# Patient Record
Sex: Female | Born: 1940 | Race: White | Hispanic: No | Marital: Married | Smoking: Current every day smoker
Health system: Southern US, Community
[De-identification: ages and names within clinical notes are randomized; demographics above are authoritative.]

## PROBLEM LIST (undated history)

## (undated) DIAGNOSIS — M549 Dorsalgia, unspecified: Secondary | ICD-10-CM

## (undated) DIAGNOSIS — N289 Disorder of kidney and ureter, unspecified: Secondary | ICD-10-CM

## (undated) DIAGNOSIS — K219 Gastro-esophageal reflux disease without esophagitis: Secondary | ICD-10-CM

## (undated) DIAGNOSIS — E7800 Pure hypercholesterolemia, unspecified: Secondary | ICD-10-CM

## (undated) DIAGNOSIS — R011 Cardiac murmur, unspecified: Secondary | ICD-10-CM

## (undated) DIAGNOSIS — J449 Chronic obstructive pulmonary disease, unspecified: Secondary | ICD-10-CM

## (undated) DIAGNOSIS — R609 Edema, unspecified: Secondary | ICD-10-CM

## (undated) DIAGNOSIS — IMO0001 Reserved for inherently not codable concepts without codable children: Secondary | ICD-10-CM

## (undated) DIAGNOSIS — G4730 Sleep apnea, unspecified: Secondary | ICD-10-CM

## (undated) DIAGNOSIS — I10 Essential (primary) hypertension: Secondary | ICD-10-CM

## (undated) DIAGNOSIS — E119 Type 2 diabetes mellitus without complications: Secondary | ICD-10-CM

## (undated) DIAGNOSIS — M1990 Unspecified osteoarthritis, unspecified site: Secondary | ICD-10-CM

## (undated) DIAGNOSIS — J189 Pneumonia, unspecified organism: Secondary | ICD-10-CM

## (undated) HISTORY — PX: REPLACEMENT TOTAL KNEE: SUR1224

## (undated) HISTORY — PX: BACK SURGERY: SHX140

## (undated) HISTORY — PX: CATARACT EXTRACTION: SUR2

---

## 1986-03-02 HISTORY — PX: ABDOMINAL HYSTERECTOMY: SHX81

## 1997-12-01 ENCOUNTER — Ambulatory Visit (HOSPITAL_COMMUNITY): Admission: RE | Admit: 1997-12-01 | Discharge: 1997-12-01 | Payer: Self-pay | Admitting: *Deleted

## 2004-01-03 ENCOUNTER — Ambulatory Visit: Payer: Self-pay | Admitting: Internal Medicine

## 2004-01-10 ENCOUNTER — Ambulatory Visit: Payer: Self-pay | Admitting: Gastroenterology

## 2005-01-12 ENCOUNTER — Ambulatory Visit: Payer: Self-pay

## 2005-01-29 ENCOUNTER — Ambulatory Visit: Payer: Self-pay | Admitting: Internal Medicine

## 2005-10-13 ENCOUNTER — Ambulatory Visit: Payer: Self-pay | Admitting: Gastroenterology

## 2006-01-28 ENCOUNTER — Encounter: Admission: RE | Admit: 2006-01-28 | Discharge: 2006-01-28 | Payer: Self-pay | Admitting: Neurosurgery

## 2006-02-11 ENCOUNTER — Ambulatory Visit (HOSPITAL_COMMUNITY): Admission: RE | Admit: 2006-02-11 | Discharge: 2006-02-12 | Payer: Self-pay | Admitting: Neurosurgery

## 2006-03-08 ENCOUNTER — Ambulatory Visit: Payer: Self-pay | Admitting: Internal Medicine

## 2006-03-22 ENCOUNTER — Ambulatory Visit: Payer: Self-pay | Admitting: Gastroenterology

## 2006-03-24 ENCOUNTER — Ambulatory Visit: Payer: Self-pay | Admitting: Gastroenterology

## 2006-11-20 ENCOUNTER — Encounter: Admission: RE | Admit: 2006-11-20 | Discharge: 2006-11-20 | Payer: Self-pay | Admitting: Neurosurgery

## 2007-03-03 HISTORY — PX: JOINT REPLACEMENT: SHX530

## 2007-04-14 ENCOUNTER — Ambulatory Visit: Payer: Self-pay | Admitting: Internal Medicine

## 2007-12-01 ENCOUNTER — Ambulatory Visit: Payer: Self-pay | Admitting: Specialist

## 2008-01-09 ENCOUNTER — Ambulatory Visit: Payer: Self-pay | Admitting: Unknown Physician Specialty

## 2008-01-17 ENCOUNTER — Inpatient Hospital Stay: Payer: Self-pay | Admitting: Unknown Physician Specialty

## 2008-01-25 ENCOUNTER — Encounter: Payer: Self-pay | Admitting: Internal Medicine

## 2008-02-03 ENCOUNTER — Inpatient Hospital Stay: Payer: Self-pay | Admitting: Internal Medicine

## 2008-02-07 ENCOUNTER — Encounter: Payer: Self-pay | Admitting: Internal Medicine

## 2008-03-02 ENCOUNTER — Encounter: Payer: Self-pay | Admitting: Internal Medicine

## 2008-04-02 ENCOUNTER — Encounter: Payer: Self-pay | Admitting: Internal Medicine

## 2008-04-05 ENCOUNTER — Ambulatory Visit: Payer: Self-pay | Admitting: Internal Medicine

## 2008-04-19 ENCOUNTER — Ambulatory Visit: Payer: Self-pay | Admitting: Internal Medicine

## 2008-04-30 ENCOUNTER — Encounter: Payer: Self-pay | Admitting: Internal Medicine

## 2008-05-31 ENCOUNTER — Encounter: Payer: Self-pay | Admitting: Internal Medicine

## 2008-06-30 ENCOUNTER — Encounter: Payer: Self-pay | Admitting: Internal Medicine

## 2008-07-31 ENCOUNTER — Encounter: Payer: Self-pay | Admitting: Internal Medicine

## 2008-10-25 ENCOUNTER — Ambulatory Visit: Payer: Self-pay | Admitting: Physician Assistant

## 2008-11-06 ENCOUNTER — Ambulatory Visit: Payer: Self-pay | Admitting: Pain Medicine

## 2008-11-20 ENCOUNTER — Ambulatory Visit: Payer: Self-pay | Admitting: Physician Assistant

## 2008-11-27 ENCOUNTER — Ambulatory Visit: Payer: Self-pay | Admitting: Pain Medicine

## 2008-12-18 ENCOUNTER — Ambulatory Visit: Payer: Self-pay | Admitting: Physician Assistant

## 2009-03-02 HISTORY — PX: CHOLECYSTECTOMY: SHX55

## 2009-04-25 ENCOUNTER — Ambulatory Visit: Payer: Self-pay | Admitting: Internal Medicine

## 2009-04-30 ENCOUNTER — Ambulatory Visit: Payer: Self-pay | Admitting: Gastroenterology

## 2009-05-29 ENCOUNTER — Ambulatory Visit: Payer: Self-pay | Admitting: Surgery

## 2009-06-05 ENCOUNTER — Ambulatory Visit: Payer: Self-pay | Admitting: Surgery

## 2009-07-04 ENCOUNTER — Ambulatory Visit: Payer: Self-pay | Admitting: Pain Medicine

## 2009-07-22 ENCOUNTER — Ambulatory Visit: Payer: Self-pay | Admitting: Pain Medicine

## 2010-01-29 ENCOUNTER — Ambulatory Visit: Payer: Self-pay | Admitting: Pain Medicine

## 2010-05-29 ENCOUNTER — Ambulatory Visit: Payer: Self-pay | Admitting: Internal Medicine

## 2010-07-08 ENCOUNTER — Ambulatory Visit: Payer: Self-pay | Admitting: Gastroenterology

## 2010-07-10 LAB — PATHOLOGY REPORT

## 2010-07-18 NOTE — Op Note (Signed)
NAME:  Brenda Glenn, Brenda Glenn                  ACCOUNT NO.:  [id number]   MEDICAL RECORD NO.:  [id number]          PATIENT TYPE:  OIB   LOCATION:  3040                         FACILITY:  [facility]   PHYSICIAN:  Coletta Memos, M.D.     DATE OF BIRTH:  1940/04/22   DATE OF PROCEDURE:  02/11/2006  DATE OF DISCHARGE:  02/12/2006                               OPERATIVE REPORT   PREOPERATIVE DIAGNOSIS:  Displaced disk, L2-3 on the right.   POSTOPERATIVE DIAGNOSIS:  Displaced disk, L2-3 on the right.   PROCEDURE:  Lumbar laminectomy L2-3 for diskectomy, right side.   COMPLICATIONS:  None.   ASSISTANT:  Hewitt Shorts, M.D.   INDICATIONS:  Brenda Glenn is a woman whom I have followed for some time,  who has been a patient of mine for some time and of the practice.  She  came in today because she was having weakness in her hip flexors, 4-/5,  on the right side.  She had a large soft tissue mass and disk herniation  at that level.  Her distal strength was fine and did not show any  problems.  I therefore recommended and she agreed to undergo a lumbar  laminectomy and diskectomy.   PREOPERATIVE DIAGNOSIS:  Displaced disk at L2-3.   POSTOPERATIVE DIAGNOSIS:  Displaced disk at L2-3, right.   PROCEDURE:  Right L2-L3 hemilaminectomy and diskectomy with  microdissection.   COMPLICATIONS:  None.   SURGEON:  Coletta Memos, M.D.   ASSISTANT:  Hewitt Shorts, M.D.   OPERATIVE NOTE:  Brenda Glenn was brought to the operating room, intubated,  and placed under general anesthetic without difficulty.  She was rolled  prone onto a Wilson frame and all pressure points were properly padded.  Her back was prepped and she was draped in a sterile fashion.  I  infiltrated 10 mL of 0.5% lidocaine with 1:200,000 strength of  epinephrine into the lumbar region.  I opened the skin with a #10 blade  and took this down to the thoracolumbar fascia sharply.  Using x-ray I  was able to identify the L2-3 disk space.  I then  proceeded with a  hemilaminectomy and diskectomy using a high-speed drill of L2.  When I  identified the ligamentum flavum, I then removed that in piecemeal  fashion to expose the thecal sac.  I brought the microscope into the  operative field and  with Dr. Earl Gala assistance, we then dissected and removed what were  large fragments of disk underlying the nerve root of L2.  I removed the  disk without significant difficulty.  I then irrigated the wound.  I  then closed the wound in layered fashion using Vicryl sutures.  Dermabond was used to close the wound in a sterile fashion.           ______________________________  Coletta Memos, M.D.     KC/MEDQ  D:  03/16/2006  T:  03/17/2006  Job:  [id number]

## 2010-12-16 ENCOUNTER — Ambulatory Visit: Payer: Self-pay | Admitting: Internal Medicine

## 2011-06-02 ENCOUNTER — Ambulatory Visit: Payer: Self-pay | Admitting: Internal Medicine

## 2012-03-20 ENCOUNTER — Ambulatory Visit: Payer: Self-pay | Admitting: Family Medicine

## 2012-06-07 ENCOUNTER — Ambulatory Visit: Payer: Self-pay | Admitting: Internal Medicine

## 2012-08-16 ENCOUNTER — Ambulatory Visit: Payer: Self-pay

## 2012-08-25 ENCOUNTER — Ambulatory Visit: Payer: Self-pay | Admitting: Gastroenterology

## 2012-08-26 LAB — PATHOLOGY REPORT

## 2013-02-20 ENCOUNTER — Emergency Department: Payer: Self-pay | Admitting: Emergency Medicine

## 2013-02-20 LAB — COMPREHENSIVE METABOLIC PANEL
Alkaline Phosphatase: 153 U/L — ABNORMAL HIGH
Anion Gap: 1 — ABNORMAL LOW (ref 7–16)
Bilirubin,Total: 0.6 mg/dL (ref 0.2–1.0)
Calcium, Total: 9.3 mg/dL (ref 8.5–10.1)
Co2: 34 mmol/L — ABNORMAL HIGH (ref 21–32)
EGFR (Non-African Amer.): 55 — ABNORMAL LOW
Glucose: 133 mg/dL — ABNORMAL HIGH (ref 65–99)
Osmolality: 277 (ref 275–301)
SGOT(AST): 56 U/L — ABNORMAL HIGH (ref 15–37)
SGPT (ALT): 57 U/L (ref 12–78)
Sodium: 136 mmol/L (ref 136–145)
Total Protein: 7.5 g/dL (ref 6.4–8.2)

## 2013-02-20 LAB — URINALYSIS, COMPLETE
Blood: NEGATIVE
Glucose,UR: NEGATIVE mg/dL (ref 0–75)
Hyaline Cast: 1
Ketone: NEGATIVE
Leukocyte Esterase: NEGATIVE
Ph: 6 (ref 4.5–8.0)
Protein: 30
RBC,UR: <1 /HPF (ref 0–5)
Squamous Epithelial: 2

## 2013-02-20 LAB — CBC
HCT: 49.8 % — ABNORMAL HIGH (ref 35.0–47.0)
HGB: 16.4 g/dL — ABNORMAL HIGH (ref 12.0–16.0)
MCHC: 32.9 g/dL (ref 32.0–36.0)
MCV: 95 fL (ref 80–100)
Platelet: 242 10*3/uL (ref 150–440)
RBC: 5.27 10*6/uL — ABNORMAL HIGH (ref 3.80–5.20)
RDW: 15.9 % — ABNORMAL HIGH (ref 11.5–14.5)
WBC: 8.4 10*3/uL (ref 3.6–11.0)

## 2013-02-20 LAB — LIPASE, BLOOD: Lipase: 60 U/L — ABNORMAL LOW (ref 73–393)

## 2013-03-02 DIAGNOSIS — J189 Pneumonia, unspecified organism: Secondary | ICD-10-CM

## 2013-03-02 HISTORY — DX: Pneumonia, unspecified organism: J18.9

## 2013-03-10 ENCOUNTER — Ambulatory Visit: Payer: Self-pay | Admitting: Specialist

## 2013-03-28 ENCOUNTER — Inpatient Hospital Stay: Payer: Self-pay | Admitting: Internal Medicine

## 2013-03-28 LAB — CBC
HCT: 42.8 % (ref 35.0–47.0)
HGB: 13.7 g/dL (ref 12.0–16.0)
MCH: 30.3 pg (ref 26.0–34.0)
MCHC: 32.1 g/dL (ref 32.0–36.0)
MCV: 94 fL (ref 80–100)
Platelet: 222 10*3/uL (ref 150–440)
RBC: 4.54 10*6/uL (ref 3.80–5.20)
RDW: 15.9 % — ABNORMAL HIGH (ref 11.5–14.5)
WBC: 11.1 10*3/uL — ABNORMAL HIGH (ref 3.6–11.0)

## 2013-03-28 LAB — BASIC METABOLIC PANEL
Anion Gap: 5 — ABNORMAL LOW (ref 7–16)
BUN: 24 mg/dL — AB (ref 7–18)
Calcium, Total: 9.2 mg/dL (ref 8.5–10.1)
Chloride: 102 mmol/L (ref 98–107)
Co2: 30 mmol/L (ref 21–32)
Creatinine: 1.21 mg/dL (ref 0.60–1.30)
EGFR (African American): 52 — ABNORMAL LOW
EGFR (Non-African Amer.): 45 — ABNORMAL LOW
GLUCOSE: 112 mg/dL — AB (ref 65–99)
Osmolality: 279 (ref 275–301)
Potassium: 4.1 mmol/L (ref 3.5–5.1)
Sodium: 137 mmol/L (ref 136–145)

## 2013-03-28 LAB — TROPONIN I
Troponin-I: <0.02 ng/mL
Troponin-I: <0.02 ng/mL

## 2013-03-29 LAB — CBC WITH DIFFERENTIAL/PLATELET
Basophil #: 0 10*3/uL (ref 0.0–0.1)
Basophil %: 0 %
EOS ABS: 0 10*3/uL (ref 0.0–0.7)
EOS PCT: 0 %
HCT: 40.3 % (ref 35.0–47.0)
HGB: 13.5 g/dL (ref 12.0–16.0)
LYMPHS ABS: 0.6 10*3/uL — AB (ref 1.0–3.6)
Lymphocyte %: 6.7 %
MCH: 31.2 pg (ref 26.0–34.0)
MCHC: 33.4 g/dL (ref 32.0–36.0)
MCV: 93 fL (ref 80–100)
Monocyte #: 0.1 x10 3/mm — ABNORMAL LOW (ref 0.2–0.9)
Monocyte %: 0.9 %
NEUTROS ABS: 8.9 10*3/uL — AB (ref 1.4–6.5)
Neutrophil %: 92.4 %
Platelet: 197 10*3/uL (ref 150–440)
RBC: 4.32 10*6/uL (ref 3.80–5.20)
RDW: 15.5 % — AB (ref 11.5–14.5)
WBC: 9.6 10*3/uL (ref 3.6–11.0)

## 2013-03-29 LAB — BASIC METABOLIC PANEL
Anion Gap: 7 (ref 7–16)
BUN: 24 mg/dL — AB (ref 7–18)
CALCIUM: 9.1 mg/dL (ref 8.5–10.1)
Chloride: 100 mmol/L (ref 98–107)
Co2: 29 mmol/L (ref 21–32)
Creatinine: 1.11 mg/dL (ref 0.60–1.30)
EGFR (African American): 58 — ABNORMAL LOW
EGFR (Non-African Amer.): 50 — ABNORMAL LOW
GLUCOSE: 175 mg/dL — AB (ref 65–99)
Osmolality: 280 (ref 275–301)
Potassium: 4.1 mmol/L (ref 3.5–5.1)
SODIUM: 136 mmol/L (ref 136–145)

## 2013-03-29 LAB — TSH: THYROID STIMULATING HORM: 0.404 u[IU]/mL — AB

## 2013-04-01 LAB — EXPECTORATED SPUTUM ASSESSMENT W GRAM STAIN, RFLX TO RESP C

## 2013-04-02 LAB — CREATININE, SERUM
Creatinine: 1.64 mg/dL — ABNORMAL HIGH (ref 0.60–1.30)
EGFR (African American): 36 — ABNORMAL LOW
EGFR (Non-African Amer.): 31 — ABNORMAL LOW

## 2013-04-02 LAB — CULTURE, BLOOD (SINGLE)

## 2013-04-03 LAB — BASIC METABOLIC PANEL
Anion Gap: 8 (ref 7–16)
BUN: 44 mg/dL — ABNORMAL HIGH (ref 7–18)
CALCIUM: 8.6 mg/dL (ref 8.5–10.1)
CO2: 28 mmol/L (ref 21–32)
Chloride: 96 mmol/L — ABNORMAL LOW (ref 98–107)
Creatinine: 1.82 mg/dL — ABNORMAL HIGH (ref 0.60–1.30)
EGFR (Non-African Amer.): 27 — ABNORMAL LOW
GFR CALC AF AMER: 32 — AB
GLUCOSE: 228 mg/dL — AB (ref 65–99)
Osmolality: 283 (ref 275–301)
Potassium: 3.3 mmol/L — ABNORMAL LOW (ref 3.5–5.1)
Sodium: 132 mmol/L — ABNORMAL LOW (ref 136–145)

## 2013-04-03 LAB — VANCOMYCIN, TROUGH: Vancomycin, Trough: 12 ug/mL (ref 10–20)

## 2013-07-05 ENCOUNTER — Ambulatory Visit: Payer: Self-pay | Admitting: Ophthalmology

## 2013-11-16 DIAGNOSIS — K219 Gastro-esophageal reflux disease without esophagitis: Secondary | ICD-10-CM | POA: Insufficient documentation

## 2013-11-16 DIAGNOSIS — E119 Type 2 diabetes mellitus without complications: Secondary | ICD-10-CM | POA: Insufficient documentation

## 2013-11-16 DIAGNOSIS — I10 Essential (primary) hypertension: Secondary | ICD-10-CM | POA: Insufficient documentation

## 2013-11-16 DIAGNOSIS — E785 Hyperlipidemia, unspecified: Secondary | ICD-10-CM | POA: Insufficient documentation

## 2013-11-30 ENCOUNTER — Ambulatory Visit: Payer: Self-pay | Admitting: Internal Medicine

## 2013-12-07 ENCOUNTER — Ambulatory Visit: Payer: Self-pay | Admitting: Internal Medicine

## 2014-05-22 ENCOUNTER — Ambulatory Visit: Payer: Self-pay | Admitting: Specialist

## 2014-06-23 NOTE — H&P (Signed)
PATIENT NAME:  Brenda Glenn, [name] MR#:  [id number] DATE OF BIRTH:  1941/01/12  DATE OF ADMISSION:  03/28/2013  PRIMARY CARE PHYSICIAN: Mickey Farberavid Thies, MD  PULMONOLOGIST: Ned ClinesHerbon Fleming, MD  CHIEF COMPLAINT: Low pulse ox.   HISTORY OF PRESENT ILLNESS: This is a 74 year old female who noticed that she had a low pulse ox of 77% yesterday. She normally is on 2 liters of oxygen secondary to COPD. She increased it up to 4 liters. She has been feeling bad. She ended up going to her gynecological office today. Her pulse ox was 78% and then when she went to the bathroom it dropped down to 66%. She was told she had an irregular heartbeat and was referred into the hospital from the gynecological office. She does complain of shortness of breath, cough, and some dizziness. In the ER, she was found to have a low pulse ox of 82% on her oxygen and she was placed on BiPAP. A chest x-ray shows left basilar airspace disease, could be atelectasis versus pneumonia. She had an elevated white count and was tachypneic and hospitalist services were contacted for further evaluation. The patient was having difficulty with communication secondary to shortness of breath and wearing the BiPAP. History obtained from old chart and family at bedside.   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   PAST SURGICAL HISTORY: Back surgery, right knee replacement, cholecystectomy, cholecystectomy, hysterectomy.   ALLERGIES: The patient denied allergies when I asked her that question, but I do see KEFLEX causes urinary tract infection, which is a side effect, LIPITOR, TEQUIN, AND ZOCOR also listed in the computer.   MEDICATIONS: Include Advair HFA CFC 115 mcg/21 mcg inhalation 2 puffs twice a day, allopurinol 200 mg daily, felodipine 10 mg daily, Flonase nasal spray as needed for allergies, Lasix 20 mg daily, losartan 100 mg daily, metoprolol ER 100 mg daily,  omeprazole 20 mg daily, Zofran 4 mg every 8 hours as needed for nausea and vomiting, oxycodone 15 mg every 6 hours as needed for pain, Mirapex 0.5 mg daily, Spiriva 1 inhalation daily, Ventolin HFA 4 puffs 4 times a day as needed for shortness of breath, vitamin D 50,000 units once every 2 weeks, and Zetia 10 mg daily.   SOCIAL HISTORY: Quit smoking 2 months ago. No alcohol. No drug use besides ones prescribed by the pain clinic. Retired from the [employer].   FAMILY HISTORY: Mother died of breast cancer. Father died of metastatic lung cancer to the bone.   REVIEW OF SYSTEMS: CONSTITUTIONAL: No fever, chills, or sweats. Positive for fatigue. No weight loss. No weight gain.  EYES: She does wear reading glasses.  EARS, NOSE, MOUTH, AND THROAT: No hearing loss. No sore throat. No difficulty swallowing.  CARDIOVASCULAR: No chest pain. No palpitations.  RESPIRATORY: Positive for shortness of breath. Positive for cough, dry. No hemoptysis.  GASTROINTESTINAL: No nausea. No vomiting. Positive for constipation. No bright red blood per rectum. No melena. GENITOURINARY: No burning on urination. No hematuria. Being see by the gynecologist for a vaginal infection.  PSYCHIATRIC: Positive for anxiety.  ENDOCRINE: No thyroid problems.  HEMATOLOGIC AND LYMPHATIC: No anemia.   PHYSICAL EXAMINATION: VITAL SIGNS: Temperature 98, pulse 87, respirations 20, blood pressure 142/78, and pulse ox 82% on oxygen. When I saw her on BiPAP, she is breathing more comfortably and pulse ox was 98%.  EYES: Conjunctivae and lids normal. Pupils equal, round, and reactive to light. Extraocular muscles  intact. No nystagmus.  EARS, NOSE, MOUTH, AND THROAT: Tympanic membranes: No erythema. Nasal mucosa: No erythema. Throat: No erythema, no exudate seen. Lips and gums: No lesions.  NECK: No JVD. No bruits. No lymphadenopathy. No thyromegaly. No thyroid nodules palpated.  RESPIRATORY: Poor air entry bilaterally. Positive wheeze  throughout entire lung field. Slight use of accessory muscles to breathe.  ABDOMEN: Soft, nontender. No organosplenomegaly. Normoactive bowel sounds. No masses felt.  LYMPHATIC: No lymph nodes in the neck.  MUSCULOSKELETAL: No clubbing. No cyanosis on the BiPAP. Trace edema.  LYMPHATIC: No lymph nodes in the neck.  PSYCHIATRIC: The patient is oriented to person, place, and time.  NEUROLOGIC: Cranial nerves II through XII grossly intact. Deep tendon reflexes 2+ bilateral lower extremities.  SKIN: No ulcers or lesions seen.   LABORATORY AND RADIOLOGICAL DATA: Chest x-ray: Left basilar airspace disease.   Troponin negative. White blood cell count 11.1, H and H 13.7 and 42.8, platelet count 222. Glucose 112, BUN 24, creatinine 1.21, sodium 137, potassium 4.1, chloride 102, CO2 30, and calcium 9.2.   Looking back on January 9th the patient had a VQ scan that was low probability and prior to that had a CT scan of the chest without contrast that showed no acute findings.   EKG this hospital admission: Normal sinus rhythm, 76 beats per minute, no acute ST-T wave changes.   ASSESSMENT AND PLAN: 1.  Acute on chronic respiratory failure with chronic obstructive pulmonary disease exacerbation and pneumonia. The patient is on BiPAP in order to oxygenate. Pulse oximetry now up to 98% on the BiPAP. Pulse oximetry was 82% on her usual oxygen prior. Will get an ABG in 1 hour, Rocephin and Zithromax and high-dose Solu-Medrol and nebulizer treatments, continue her Advair and Spiriva.  2.  Systemic inflammatory response syndrome with tachypnea, leukocytosis, and pneumonia. Continue antibiotics for pneumonia.  3.  Chronic kidney disease. This is stable.  4.  Diabetes. Sugars will elevate with steroids. We will put on sliding scale.  5.  Chronic pain. Continue oxycodone.  6.  Gout. Continue allopurinol, renally dosed.  7.  Hypertension. Continue medications. No changes made.  The patient is critically ill, high  risk for intubation. We will watch closely on the floor since she seems to be tolerating BiPAP at this point.   TIME SPENT ON ADMISSION: 55 minutes.   ____________________________ Herschell Dimes. Renae Gloss, MD rjw:sb D: 03/28/2013 15:08:06 ET T: 03/28/2013 15:27:38 ET JOB#: [id number]  cc: Herschell Dimes. Renae Gloss, MD, <Dictator> Neomia Dear. Harrington Challenger, MD Herbon E. Meredeth Ide, MD Salley Scarlet MD ELECTRONICALLY SIGNED 03/31/2013 15:26

## 2014-06-23 NOTE — Discharge Summary (Signed)
PATIENT NAME:  Brenda Glenn, Brenda MeigsJAMIE G MR#:  [id number] DATE OF BIRTH:  February 12, 1941  DATE OF ADMISSION:  03/28/2013 DATE OF DISCHARGE:  04/03/2013  DISCHARGE DIAGNOSES:  1.  Acute on chronic respiratory failure due to methicillin resistant Staphylococcus aureus and Klebsiella pneumonia.  2.  Chronic obstructive pulmonary disease exacerbation.  3.  Hypertension.  4.  Diabetes.  5.  Pneumonia.   CONDITION ON DISCHARGE:  Stable.   MEDICATIONS ON DISCHARGE: 1.  Allopurinol 100 mg oral tablet 2 tablets once a day.  2.  Felodipine 10 mg oral tablet extended-release once a day.  3.  Oxycodone 15 mg oral tablet every six hours as needed for pain.  4.  Losartan 100 mg oral tablet once a day.  5.  Omeprazole 20 mg delayed-release capsule once a day.  6.  Metoprolol 100 mg oral tablet once a day.  7.  Furosemide 20 mg oral tablet once a day.  8.  Zetia 10 mg once a day.  9.  Vitamin D2 50,000 International units oral capsule every week for two weeks.  10.  Fluticasone nasal spray as needed for allergy.  11.  Spiriva 18 mcg once a day.  12.  Advair 2 puff inhalation 2 times a day.  14.  Prednisone 10 mg, start at 60 and taper by 10 mg until complete.  15.  Alprazolam 0.25 mg oral tablet 2 times a day.  16.  Theophylline tablets 2 times a day 100 mg.  17.  Zyvox 600 mg oral tablet 2 times a day for nine days.  18.  Ceftin 500 mg oral tablet 2 times a day for nine days.   HOME OXYGEN:  Yes.  Advised to have 2 liters nasal cannula oxygen on discharge.   DIET:  Low sodium carbohydrate-controlled ADA diet.  Regular consistency.   TIMEFRAME TO FOLLOW UP:  Within one to two weeks in Dr. Reita ClicheFleming's office.  follow renal function with primary care physician in Dr. Reita ClicheFleming's office in one week.    HISTORY OF PRESENTING ILLNESS:   61.  A 74 year old female who noticed that she had low pulse oxygen at 77%.  She is normally on 2 liters oxygen due to her COPD.  She increased it to 4 liters.  She was still feeling  very short of breath so decided to come to the hospital.  Oxygen saturation dropped down to 66%.  She was placed on BiPAP in the Emergency Room and chest x-ray was done which showed left basilar airspace disease.  On further questioning, she said that she was given antibiotic therapy twice in the last few weeks, but not improved enough, is unable to take off the BiPAP.  Finally sputum culture was collected and that was MRSA and Klebsiella in the sputum.  She was started on broad spectrum antibiotic vancomycin, Zosyn and Levaquin for MRSA as the results came back so she is very limited due to renal dysfunction worsening and so we discharged her on linezolid.  The patient also had Klebsiella pneumoniae on the sputum and that was sensitive to ciprofloxacin, on discharge as she was feeling better.   2.  COPD exacerbation.  We gave her steroids, Spiriva, Advair and nebulizer and added Theophylline advised by Dr. Reita ClicheFleming's consult.  We discharged her on tapering oral steroid and Theophylline tablet.  3.  Systemic inflammatory response syndrome.  This was present on admission.  4.  Chronic kidney disease.  Advised to check kidney function in 3 to 4 days with Dr.  Meredeth Ide or primary care physician's office.  5.  Chronic pain.  We continued oxycodone.  6.  Hypertension.  We continued medication.  No changes are made.  CONSULTATIONS IN THE HOSPITAL:  Pulmonary consult with Dr. Meredeth Ide.   Total time spent in this discharge 40 minutes.     ____________________________ Hope Pigeon Elisabeth Pigeon, MD vgv:ea D: 04/06/2013 23:00:53 ET T: 04/07/2013 00:58:41 ET JOB#: [id number]  cc: Hope Pigeon. Elisabeth Pigeon, MD, <Dictator> Herbon E. Meredeth Ide, MD Neomia Dear. Harrington Challenger, MD   [facility] MD ELECTRONICALLY SIGNED 04/22/2013 8:35

## 2014-09-04 NOTE — Discharge Instructions (Signed)

## 2014-09-05 ENCOUNTER — Ambulatory Visit: Payer: Medicare Other | Admitting: Anesthesiology

## 2014-09-05 ENCOUNTER — Ambulatory Visit
Admission: RE | Admit: 2014-09-05 | Discharge: 2014-09-05 | Disposition: A | Discharge status: Home / Self Care | Payer: Medicare Other | Source: Ambulatory Visit | Attending: Ophthalmology | Admitting: Ophthalmology

## 2014-09-05 ENCOUNTER — Encounter
Admission: RE | Disposition: A | Discharge status: Home / Self Care | Payer: Self-pay | Source: Ambulatory Visit | Attending: Ophthalmology

## 2014-09-05 DIAGNOSIS — M1990 Unspecified osteoarthritis, unspecified site: Secondary | ICD-10-CM | POA: Diagnosis not present

## 2014-09-05 DIAGNOSIS — M7989 Other specified soft tissue disorders: Secondary | ICD-10-CM | POA: Diagnosis not present

## 2014-09-05 DIAGNOSIS — Z9981 Dependence on supplemental oxygen: Secondary | ICD-10-CM | POA: Insufficient documentation

## 2014-09-05 DIAGNOSIS — Z90710 Acquired absence of both cervix and uterus: Secondary | ICD-10-CM | POA: Diagnosis not present

## 2014-09-05 DIAGNOSIS — Z888 Allergy status to other drugs, medicaments and biological substances status: Secondary | ICD-10-CM | POA: Insufficient documentation

## 2014-09-05 DIAGNOSIS — N289 Disorder of kidney and ureter, unspecified: Secondary | ICD-10-CM | POA: Diagnosis not present

## 2014-09-05 DIAGNOSIS — I10 Essential (primary) hypertension: Secondary | ICD-10-CM | POA: Insufficient documentation

## 2014-09-05 DIAGNOSIS — H25042 Posterior subcapsular polar age-related cataract, left eye: Secondary | ICD-10-CM | POA: Diagnosis present

## 2014-09-05 DIAGNOSIS — Z96659 Presence of unspecified artificial knee joint: Secondary | ICD-10-CM | POA: Diagnosis not present

## 2014-09-05 DIAGNOSIS — M549 Dorsalgia, unspecified: Secondary | ICD-10-CM | POA: Diagnosis not present

## 2014-09-05 DIAGNOSIS — E119 Type 2 diabetes mellitus without complications: Secondary | ICD-10-CM | POA: Insufficient documentation

## 2014-09-05 DIAGNOSIS — Z9841 Cataract extraction status, right eye: Secondary | ICD-10-CM | POA: Insufficient documentation

## 2014-09-05 DIAGNOSIS — E780 Pure hypercholesterolemia: Secondary | ICD-10-CM | POA: Diagnosis not present

## 2014-09-05 DIAGNOSIS — Z9049 Acquired absence of other specified parts of digestive tract: Secondary | ICD-10-CM | POA: Insufficient documentation

## 2014-09-05 DIAGNOSIS — R011 Cardiac murmur, unspecified: Secondary | ICD-10-CM | POA: Diagnosis not present

## 2014-09-05 DIAGNOSIS — K219 Gastro-esophageal reflux disease without esophagitis: Secondary | ICD-10-CM | POA: Diagnosis not present

## 2014-09-05 DIAGNOSIS — G4730 Sleep apnea, unspecified: Secondary | ICD-10-CM | POA: Insufficient documentation

## 2014-09-05 DIAGNOSIS — F17200 Nicotine dependence, unspecified, uncomplicated: Secondary | ICD-10-CM | POA: Diagnosis not present

## 2014-09-05 HISTORY — PX: CATARACT EXTRACTION W/PHACO: SHX586

## 2014-09-05 HISTORY — DX: Unspecified osteoarthritis, unspecified site: M19.90

## 2014-09-05 HISTORY — DX: Chronic obstructive pulmonary disease, unspecified: J44.9

## 2014-09-05 HISTORY — DX: Reserved for inherently not codable concepts without codable children: IMO0001

## 2014-09-05 HISTORY — DX: Cardiac murmur, unspecified: R01.1

## 2014-09-05 HISTORY — DX: Type 2 diabetes mellitus without complications: E11.9

## 2014-09-05 HISTORY — DX: Pneumonia, unspecified organism: J18.9

## 2014-09-05 HISTORY — DX: Disorder of kidney and ureter, unspecified: N28.9

## 2014-09-05 HISTORY — DX: Edema, unspecified: R60.9

## 2014-09-05 HISTORY — DX: Sleep apnea, unspecified: G47.30

## 2014-09-05 HISTORY — DX: Pure hypercholesterolemia, unspecified: E78.00

## 2014-09-05 HISTORY — DX: Dorsalgia, unspecified: M54.9

## 2014-09-05 HISTORY — DX: Gastro-esophageal reflux disease without esophagitis: K21.9

## 2014-09-05 HISTORY — DX: Essential (primary) hypertension: I10

## 2014-09-05 LAB — GLUCOSE, CAPILLARY
GLUCOSE-CAPILLARY: 124 mg/dL — AB (ref 65–99)
GLUCOSE-CAPILLARY: 129 mg/dL — AB (ref 65–99)

## 2014-09-05 SURGERY — PHACOEMULSIFICATION, CATARACT, WITH IOL INSERTION
Anesthesia: Monitor Anesthesia Care | Site: Eye | Laterality: Left | Wound class: Clean

## 2014-09-05 MED ORDER — ACETAMINOPHEN 325 MG PO TABS: 325.0000 mg | ORAL_TABLET | ORAL | Status: DC | PRN

## 2014-09-05 MED ORDER — NA HYALUR & NA CHOND-NA HYALUR 0.4-0.35 ML IO KIT
PACK | INTRAOCULAR | Status: DC | PRN
  Administered 2014-09-05: 1 mL via INTRAOCULAR

## 2014-09-05 MED ORDER — PHENYLEPHRINE HCL 10 % OP SOLN
1.0000 [drp] | OPHTHALMIC | Status: DC | PRN
  Administered 2014-09-05 (×3): 1 [drp] via OPHTHALMIC

## 2014-09-05 MED ORDER — BRIMONIDINE TARTRATE 0.2 % OP SOLN
OPHTHALMIC | Status: DC | PRN
  Administered 2014-09-05: 1 [drp] via OPHTHALMIC

## 2014-09-05 MED ORDER — MIDAZOLAM HCL 2 MG/2ML IJ SOLN
INTRAMUSCULAR | Status: DC | PRN
  Administered 2014-09-05 (×2): 1 mg via INTRAVENOUS

## 2014-09-05 MED ORDER — EPINEPHRINE HCL 1 MG/ML IJ SOLN
INTRAOCULAR | Status: DC | PRN
  Administered 2014-09-05: 68 mL via OPHTHALMIC

## 2014-09-05 MED ORDER — POLYMYXIN B-TRIMETHOPRIM 10000-0.1 UNIT/ML-% OP SOLN
1.0000 [drp] | OPHTHALMIC | Status: DC | PRN
  Administered 2014-09-05 (×3): 1 [drp] via OPHTHALMIC

## 2014-09-05 MED ORDER — TETRACAINE HCL 0.5 % OP SOLN
1.0000 [drp] | OPHTHALMIC | Status: DC | PRN
  Administered 2014-09-05: 1 [drp] via OPHTHALMIC

## 2014-09-05 MED ORDER — POVIDONE-IODINE 5 % OP SOLN
1.0000 "application " | OPHTHALMIC | Status: DC | PRN
  Administered 2014-09-05: 1 via OPHTHALMIC

## 2014-09-05 MED ORDER — CEFUROXIME OPHTHALMIC INJECTION 1 MG/0.1 ML
INJECTION | OPHTHALMIC | Status: DC | PRN
  Administered 2014-09-05: 0.1 mL via INTRACAMERAL

## 2014-09-05 MED ORDER — ACETAMINOPHEN 160 MG/5ML PO SOLN: 325.0000 mg | ORAL | Status: DC | PRN

## 2014-09-05 MED ORDER — CYCLOPENTOLATE HCL 2 % OP SOLN
1.0000 [drp] | OPHTHALMIC | Status: DC | PRN
  Administered 2014-09-05 (×3): 1 [drp] via OPHTHALMIC

## 2014-09-05 MED ORDER — FENTANYL CITRATE (PF) 100 MCG/2ML IJ SOLN
INTRAMUSCULAR | Status: DC | PRN
  Administered 2014-09-05: 1 ug via INTRAVENOUS
  Administered 2014-09-05: 50 ug via INTRAVENOUS

## 2014-09-05 MED ORDER — LIDOCAINE HCL 2 % EX GEL
1.0000 "application " | Freq: Once | CUTANEOUS | Status: AC
  Administered 2014-09-05: 1 via TOPICAL

## 2014-09-05 MED ORDER — TIMOLOL MALEATE 0.5 % OP SOLN
OPHTHALMIC | Status: DC | PRN
  Administered 2014-09-05: 1 [drp] via OPHTHALMIC

## 2014-09-05 SURGICAL SUPPLY — 26 items
CANNULA ANT/CHMB 27GA (MISCELLANEOUS) ×3 IMPLANT
GLOVE SURG LX 7.5 STRW (GLOVE) ×2
GLOVE SURG LX STRL 7.5 STRW (GLOVE) ×1 IMPLANT
GLOVE SURG TRIUMPH 8.0 PF LTX (GLOVE) ×3 IMPLANT
GOWN STRL REUS W/ TWL LRG LVL3 (GOWN DISPOSABLE) ×2 IMPLANT
GOWN STRL REUS W/TWL LRG LVL3 (GOWN DISPOSABLE) ×4
LENS IOL TECNIS 23.5 (Intraocular Lens) ×3 IMPLANT
LENS IOL TECNIS MONO 1P 23.5 (Intraocular Lens) ×1 IMPLANT
MARKER SKIN SURG W/RULER VIO (MISCELLANEOUS) ×3 IMPLANT
NDL RETROBULBAR .5 NSTRL (NEEDLE) IMPLANT
NEEDLE FILTER BLUNT 18X 1/2SAF (NEEDLE) ×2
NEEDLE FILTER BLUNT 18X1 1/2 (NEEDLE) ×1 IMPLANT
PACK CATARACT BRASINGTON (MISCELLANEOUS) ×3 IMPLANT
PACK EYE AFTER SURG (MISCELLANEOUS) ×3 IMPLANT
PACK OPTHALMIC (MISCELLANEOUS) ×3 IMPLANT
RING MALYGIN 7.0 (MISCELLANEOUS) IMPLANT
SUT ETHILON 10-0 CS-B-6CS-B-6 (SUTURE)
SUT VICRYL  9 0 (SUTURE)
SUT VICRYL 9 0 (SUTURE) IMPLANT
SUTURE EHLN 10-0 CS-B-6CS-B-6 (SUTURE) IMPLANT
SYR 3ML LL SCALE MARK (SYRINGE) ×3 IMPLANT
SYR 5ML LL (SYRINGE) IMPLANT
SYR TB 1ML LUER SLIP (SYRINGE) ×3 IMPLANT
WATER STERILE IRR 250ML POUR (IV SOLUTION) ×3 IMPLANT
WATER STERILE IRR 500ML POUR (IV SOLUTION) IMPLANT
WIPE NON LINTING 3.25X3.25 (MISCELLANEOUS) ×3 IMPLANT

## 2014-09-05 NOTE — Anesthesia Procedure Notes (Signed)
Procedure Name: MAC Performed by: Andee PolesBUSH, Jaimes Eckert Pre-anesthesia Checklist: Patient identified, Emergency Drugs available, Suction available, Timeout performed and Patient being monitored Patient Re-evaluated:Patient Re-evaluated prior to inductionOxygen Delivery Method: Nasal cannula Placement Confirmation: positive ETCO2 Comments: Pt connected to her own oxygen source,

## 2014-09-05 NOTE — Transfer of Care (Signed)
Immediate Anesthesia Transfer of Care Note  Patient: Brenda Glenn  Procedure(s) Performed: Procedure(s) with comments: CATARACT EXTRACTION PHACO AND INTRAOCULAR LENS PLACEMENT (IOC) (Left) - DIABETIC  Patient Location: PACU  Anesthesia Type: MAC  Level of Consciousness: awake, alert  and patient cooperative  Airway and Oxygen Therapy: Patient Spontanous Breathing and Patient connected to supplemental oxygen  Post-op Assessment: Post-op Vital signs reviewed, Patient's Cardiovascular Status Stable, Respiratory Function Stable, Patent Airway and No signs of Nausea or vomiting  Post-op Vital Signs: Reviewed and stable  Complications: No apparent anesthesia complications

## 2014-09-05 NOTE — Anesthesia Preprocedure Evaluation (Addendum)
Anesthesia Evaluation  Patient identified by MRN, date of birth, ID band  Reviewed: Allergy & Precautions, H&P , NPO status , Patient's Chart, lab work & pertinent test results  Airway Mallampati: II  TM Distance: >3 FB Neck ROM: full    Dental no notable dental hx.    Pulmonary shortness of breath, sleep apnea , COPD COPD inhaler and oxygen dependent, Current Smoker,    + decreased breath sounds+ wheezing      Cardiovascular hypertension, Rhythm:regular Rate:Normal     Neuro/Psych    GI/Hepatic GERD-  ,  Endo/Other  diabetes  Renal/GU      Musculoskeletal   Abdominal   Peds  Hematology   Anesthesia Other Findings Poor dentition  Reproductive/Obstetrics                            Anesthesia Physical Anesthesia Plan  ASA: IV  Anesthesia Plan: MAC   Post-op Pain Management:    Induction:   Airway Management Planned:   Additional Equipment:   Intra-op Plan:   Post-operative Plan:   Informed Consent: I have reviewed the patients History and Physical, chart, labs and discussed the procedure including the risks, benefits and alternatives for the proposed anesthesia with the patient or authorized representative who has indicated his/her understanding and acceptance.     Plan Discussed with: CRNA  Anesthesia Plan Comments:         Anesthesia Quick Evaluation

## 2014-09-05 NOTE — H&P (Signed)
  The History and Physical notes were scanned in.  The patient remains stable and unchanged from the H&P.   Previous H&P reviewed, patient examined, and there are no changes.  Brenda Glenn 09/05/2014 7:37 AM

## 2014-09-05 NOTE — Op Note (Signed)
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 Item Serial No. Manufacturer Lot No. LRB No. Used  intraocular lens     [id number] [employer]   Left 1  [id number] 23.5 D PCIOL   ULTRASOUND TIME: 9.5  % of 0 minutes 53 seconds, CDE 5.1  SURGEON:  Deirdre Evenerhadwick R. Grason Brailsford, MD   ANESTHESIA:  Topical with tetracaine drops and 2% Xylocaine jelly.   COMPLICATIONS:  None.   DESCRIPTION OF PROCEDURE:  The patient was identified in the holding room and transported to the operating room and placed in the supine position under the operating microscope.  The left eye was identified as the operative eye and it was prepped and draped in the usual sterile ophthalmic fashion.   A 1 millimeter clear-corneal paracentesis was made at the 1:30 position.  The anterior chamber was filled with Viscoat viscoelastic.  A 2.4 millimeter keratome was used to make a near-clear corneal incision at the 10:30 position.  .  A curvilinear capsulorrhexis was made with a cystotome and capsulorrhexis forceps.  Balanced salt solution was used to hydrodissect and hydrodelineate the nucleus.   Phacoemulsification was then used in stop and chop fashion to remove the lens nucleus and epinucleus.  The remaining cortex was then removed using the irrigation and aspiration handpiece. Provisc was then placed into the capsular bag to distend it for lens placement.  A lens was then injected into the capsular bag.  The remaining viscoelastic was aspirated.   Wounds were hydrated with balanced salt solution.  The anterior chamber was inflated to a physiologic pressure with balanced salt solution.  No wound leaks were noted. Cefuroxime 0.1 ml of a  10mg /ml solution was injected into the anterior chamber for a dose of 1 mg of intracameral antibiotic at the completion of the case.   Timolol and Brimonidine drops were applied to the eye.  The patient was taken to the recovery room in stable condition without complications of anesthesia or surgery.  [name] 09/05/2014, 8:00 AM

## 2014-09-05 NOTE — Anesthesia Postprocedure Evaluation (Signed)
  Anesthesia Post-op Note  Patient: Brenda Glenn  Procedure(s) Performed: Procedure(s) with comments: CATARACT EXTRACTION PHACO AND INTRAOCULAR LENS PLACEMENT (IOC) (Left) - DIABETIC  Anesthesia type:MAC  Patient location: PACU  Post pain: Pain level controlled  Post assessment: Post-op Vital signs reviewed, Patient's Cardiovascular Status Stable, Respiratory Function Stable, Patent Airway and No signs of Nausea or vomiting  Post vital signs: Reviewed and stable  Last Vitals:  Filed Vitals:   09/05/14 0800  BP: 126/74  Pulse: 83  Temp: 36.4 C  Resp: 18    Level of consciousness: awake, alert  and patient cooperative  Complications: No apparent anesthesia complications

## 2014-09-07 ENCOUNTER — Encounter: Payer: Self-pay | Admitting: Ophthalmology

## 2014-11-20 ENCOUNTER — Other Ambulatory Visit: Payer: Self-pay | Admitting: Internal Medicine

## 2014-11-20 DIAGNOSIS — Z1231 Encounter for screening mammogram for malignant neoplasm of breast: Secondary | ICD-10-CM

## 2014-12-05 ENCOUNTER — Ambulatory Visit
Admission: RE | Admit: 2014-12-05 | Discharge: 2014-12-05 | Disposition: A | Discharge status: Home / Self Care | Payer: Medicare Other | Source: Ambulatory Visit | Attending: Internal Medicine | Admitting: Internal Medicine

## 2014-12-05 DIAGNOSIS — Z1231 Encounter for screening mammogram for malignant neoplasm of breast: Secondary | ICD-10-CM | POA: Diagnosis present

## 2015-03-03 HISTORY — PX: BLADDER SURGERY: SHX569

## 2015-10-23 ENCOUNTER — Other Ambulatory Visit: Payer: Self-pay | Admitting: Internal Medicine

## 2015-10-23 DIAGNOSIS — Z1231 Encounter for screening mammogram for malignant neoplasm of breast: Secondary | ICD-10-CM

## 2015-10-23 DIAGNOSIS — Z803 Family history of malignant neoplasm of breast: Secondary | ICD-10-CM

## 2015-12-09 ENCOUNTER — Ambulatory Visit (INDEPENDENT_AMBULATORY_CARE_PROVIDER_SITE_OTHER): Payer: Self-pay | Admitting: Vascular Surgery

## 2015-12-10 ENCOUNTER — Ambulatory Visit
Admission: RE | Admit: 2015-12-10 | Discharge: 2015-12-10 | Disposition: A | Discharge status: Home / Self Care | Payer: Medicare Other | Source: Ambulatory Visit | Attending: Internal Medicine | Admitting: Internal Medicine

## 2015-12-10 DIAGNOSIS — Z1231 Encounter for screening mammogram for malignant neoplasm of breast: Secondary | ICD-10-CM | POA: Diagnosis not present

## 2015-12-10 DIAGNOSIS — Z803 Family history of malignant neoplasm of breast: Secondary | ICD-10-CM

## 2015-12-26 ENCOUNTER — Ambulatory Visit (INDEPENDENT_AMBULATORY_CARE_PROVIDER_SITE_OTHER): Payer: Medicare Other | Admitting: Vascular Surgery

## 2016-01-06 ENCOUNTER — Encounter (INDEPENDENT_AMBULATORY_CARE_PROVIDER_SITE_OTHER): Payer: Self-pay

## 2016-01-06 ENCOUNTER — Ambulatory Visit (INDEPENDENT_AMBULATORY_CARE_PROVIDER_SITE_OTHER): Payer: Medicare Other | Admitting: Vascular Surgery

## 2016-01-06 ENCOUNTER — Encounter (INDEPENDENT_AMBULATORY_CARE_PROVIDER_SITE_OTHER): Payer: Self-pay | Admitting: Vascular Surgery

## 2016-01-06 DIAGNOSIS — M79609 Pain in unspecified limb: Secondary | ICD-10-CM | POA: Insufficient documentation

## 2016-01-06 DIAGNOSIS — M79604 Pain in right leg: Secondary | ICD-10-CM | POA: Diagnosis not present

## 2016-01-06 DIAGNOSIS — J449 Chronic obstructive pulmonary disease, unspecified: Secondary | ICD-10-CM | POA: Insufficient documentation

## 2016-01-06 DIAGNOSIS — J439 Emphysema, unspecified: Secondary | ICD-10-CM | POA: Diagnosis not present

## 2016-01-06 DIAGNOSIS — R269 Unspecified abnormalities of gait and mobility: Secondary | ICD-10-CM | POA: Diagnosis not present

## 2016-01-06 DIAGNOSIS — I890 Lymphedema, not elsewhere classified: Secondary | ICD-10-CM

## 2016-01-06 DIAGNOSIS — I872 Venous insufficiency (chronic) (peripheral): Secondary | ICD-10-CM

## 2016-01-06 NOTE — Progress Notes (Signed)
[facility] Admission History & Physical  MRN : [id number]  [name] is a 75 y.o. (12/02/1940) female who presents with chief complaint of  Chief Complaint  Patient presents with  . Re-evaluation    One yeat follow up  .  History of Present Illness: The patient returns to the office for followup evaluation regarding leg swelling. The swelling has persisted but with the lymph pump is much, much better controlled. The pain associated with swelling is essentially eliminated. There have not been any interval development of a ulcerations or wounds.  Since the previous visit the patient has been wearing graduated compression stockings and using the lymph pump on a routine basis and has noted significant improvement in the lymphedema.  Patient stated the lymph pump has been a very positive factor in her care.  Current Meds  Medication Sig  . Albuterol (VENTOLIN IN) Inhale into the lungs as needed.  Marland Kitchen. allopurinol (ZYLOPRIM) 100 MG tablet Take 100 mg by mouth daily. Take 2 in Am  . cyclobenzaprine (FLEXERIL) 10 MG tablet Take 10 mg by mouth 3 (three) times daily as needed for muscle spasms.  Marland Kitchen. ezetimibe (ZETIA) 10 MG tablet Take 10 mg by mouth at bedtime.  . felodipine (PLENDIL) 10 MG 24 hr tablet Take 10 mg by mouth at bedtime.  . fluticasone-salmeterol (ADVAIR HFA) 115-21 MCG/ACT inhaler Inhale 2 puffs into the lungs 2 (two) times daily. Am and bedtime  . furosemide (LASIX) 20 MG tablet Take 20 mg by mouth. am  . losartan (COZAAR) 100 MG tablet Take 100 mg by mouth daily. am  . metoprolol succinate (TOPROL-XL) 100 MG 24 hr tablet Take 100 mg by mouth daily. Take with or immediately following a meal.  . Multiple Vitamin (MULTI VITAMIN DAILY PO) Take by mouth. am  . omeprazole (PRILOSEC) 20 MG capsule Take 20 mg by mouth daily. am  . oxyCODONE (ROXICODONE) 15 MG immediate release tablet Take 15 mg by mouth every 4 (four) hours as needed for pain. Every 5 hrs 3  times/day  . pramipexole (MIRAPEX) 0.5 MG tablet Take 0.5 mg by mouth at bedtime.  . promethazine (PHENERGAN) 25 MG tablet Take 25 mg by mouth every 6 (six) hours as needed for nausea or vomiting.  . theophylline (THEO-24) 400 MG 24 hr capsule Take 400 mg by mouth daily. Am  . tiotropium (SPIRIVA) 18 MCG inhalation capsule Place 18 mcg into inhaler and inhale daily. am  . Vitamin D, Ergocalciferol, (DRISDOL) 50000 UNITS CAPS capsule Take 50,000 Units by mouth every 14 (fourteen) days.    Past Medical History:  Diagnosis Date  . Arthritis    all over  . Back pain   . COPD (chronic obstructive pulmonary disease) (HCC)    O2 use continuosly at 2 liters/ some  asthma symptoms  . Diabetes mellitus without complication (HCC)    diet controlled  . GERD (gastroesophageal reflux disease)   . Heart murmur    lifelong  . Hypercholesterolemia   . Hypertension    controlled on meds  . Pneumonia 2015   [facility] hospitalized  . Renal insufficiency    early stage kidney failure  . Shortness of breath dyspnea   . Sleep apnea    no CPAP, O2 only  . Swelling    of feet and legs    Past Surgical History:  Procedure Laterality Date  . ABDOMINAL HYSTERECTOMY  1988  . BACK SURGERY  1978 and 1995   residual nerve  damage legs-weakness  . CATARACT EXTRACTION Right   . CATARACT EXTRACTION W/PHACO Left 09/05/2014   Procedure: CATARACT EXTRACTION PHACO AND INTRAOCULAR LENS PLACEMENT (IOC);  Surgeon: Lockie Molahadwick Brasington, MD;  [facility];  Service: Ophthalmology;  Laterality: Left;  DIABETIC  . CHOLECYSTECTOMY  2011  . REPLACEMENT TOTAL KNEE Right     Social History Social History  Substance Use Topics  . Smoking status: Current Every Day Smoker    Packs/day: 0.50    Years: 50.00    Types: Cigarettes  . Smokeless tobacco: Current User  . Alcohol use Yes     Comment: rarely    Family History Family History  Problem Relation Age of Onset  . Breast cancer Mother 6775  . Breast  cancer Paternal Grandmother 9070    Allergies  Allergen Reactions  . Tequin [Gatifloxacin] Shortness Of Breath  . Keflex [Cephalexin]   . Lisinopril      REVIEW OF SYSTEMS (Negative unless checked)  Constitutional: [] Weight loss  [] Fever  [] Chills Cardiac: [] Chest pain   [] Chest pressure   [] Palpitations   [] Shortness of breath when laying flat   [] Shortness of breath with exertion. Vascular:  [] Pain in legs with walking   [] Pain in legs at rest  [] History of DVT   [] Phlebitis   [x] Swelling in legs   [] Varicose veins   [] Non-healing ulcers Pulmonary:   [] Uses home oxygen   [] Productive cough   [] Hemoptysis   [] Wheeze  [] COPD   [] Asthma Neurologic:  [] Dizziness   [] Seizures   [] History of stroke   [] History of TIA  [] Aphasia   [] Vissual changes   [] Weakness or numbness in arm   [] Weakness or numbness in leg Musculoskeletal:   [] Joint swelling   [] Joint pain   [] Low back pain Hematologic:  [] Easy bruising  [] Easy bleeding   [] Hypercoagulable state   [] Anemic Gastrointestinal:  [] Diarrhea   [] Vomiting  [] Gastroesophageal reflux/heartburn   [] Difficulty swallowing. Genitourinary:  [] Chronic kidney disease   [] Difficult urination  [] Frequent urination   [] Blood in urine Skin:  [] Rashes   [] Ulcers  Psychological:  [] History of anxiety   []  History of major depression.  Physical Examination  Vitals:   01/06/16 1317  BP: 128/73  Pulse: 75  Resp: 16  Weight: 171 lb (77.6 kg)  Height: 4\' 11"  (1.499 m)   Body mass index is 34.54 kg/m. Gen: WD/WN, NAD Head: [place]/AT, No temporalis wasting.  Ear/Nose/Throat: Hearing grossly intact, nares w/o erythema or drainage, poor dentition Eyes: PER, EOMI, sclera nonicteric.  Neck: Supple, no masses.  No bruit or JVD.  Pulmonary:  Good air movement, clear to auscultation bilaterally, no use of accessory muscles.  Cardiac: RRR, normal S1, S2, no Murmurs. Vascular:  2-3+ edema bilateral lower extremities Vessel Right Left  Radial Palpable Palpable   Ulnar Palpable Palpable  Brachial Palpable Palpable  Carotid Palpable Palpable  Femoral Palpable Palpable  Popliteal Palpable Palpable  PT Palpable Palpable  DP Palpable Palpable   Gastrointestinal: soft, non-distended. No guarding/no peritoneal signs.  Musculoskeletal: M/S 5/5 throughout.  No deformity or atrophy.  Neurologic: CN 2-12 intact. Pain and light touch intact in extremities.  Symmetrical.  Speech is fluent. Motor exam as listed above. Psychiatric: Judgment intact, Mood & affect appropriate for pt's clinical situation. Dermatologic: No rashes or ulcers noted.  No changes consistent with cellulitis. Lymph : No Cervical lymphadenopathy, no lichenification or skin changes of chronic lymphedema.  CBC Lab Results  Component Value Date   WBC 9.6 03/29/2013  HGB 13.5 03/29/2013   HCT 40.3 03/29/2013   MCV 93 03/29/2013   PLT 197 03/29/2013    BMET    Component Value Date/Time   NA 132 (L) 04/03/2013 1231   K 3.3 (L) 04/03/2013 1231   CL 96 (L) 04/03/2013 1231   CO2 28 04/03/2013 1231   GLUCOSE 228 (H) 04/03/2013 1231   BUN 44 (H) 04/03/2013 1231   CREATININE 1.82 (H) 04/03/2013 1231   CALCIUM 8.6 04/03/2013 1231   GFRNONAA 27 (L) 04/03/2013 1231   GFRAA 32 (L) 04/03/2013 1231   CrCl cannot be calculated (Patient's most recent lab result is older than the maximum 21 days allowed.).  COAG No results found for: INR, PROTIME  Radiology Mm Digital Screening Bilateral  Result Date: 12/10/2015 CLINICAL DATA:  Screening. EXAM: DIGITAL SCREENING BILATERAL MAMMOGRAM WITH CAD COMPARISON:  Previous exam(s). ACR Breast Density Category b: There are scattered areas of fibroglandular density. FINDINGS: There are no findings suspicious for malignancy. Images were processed with CAD. IMPRESSION: No mammographic evidence of malignancy. A result letter of this screening mammogram will be mailed directly to the patient. RECOMMENDATION: Screening mammogram in one year.  (Code:SM-B-01Y) BI-RADS CATEGORY  1: Negative. Electronically Signed   By: Ted Mcalpine M.D.   On: 12/10/2015 15:49    Assessment/Plan Lymphedema (457.1  I89.0) Impression: No surgery or intervention at this point in time.  I have reviewed my discussion with the patient regarding swelling and why it causes symptoms. Patient will continue wearing graduated compression stockings on a daily basis a prescription was given. The patient will beginning wearing the stockings first thing in the morning and removing them in the evening. The patient is instructed specifically not to sleep in the stockings.  In addition, behavioral modification including elevation during the day will be continued. This will include frequent elevation, use of over the counter pain medications and exercise such as walking.  I have reviewed systemic causes for chronic edema such as liver, kidney and cardiac etiologies and there does not appear to be any significant changes in these organ systems over the past year. The patient is under the impression that these organ systems are all stable and unchanged.  The patient will continue aggressive use of the lymph pump.  Given that her right leg is itching and bothering her more and appears to be stassis changes to the skin I have asked her to increase her use of the pump. 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  Uncomplicated type 2 diabetes mellitus (250.00  E11.9)  Obesity  (278.00  E66.9)   Levora Dredge, MD  01/06/2016 1:34 PM

## 2016-01-15 ENCOUNTER — Other Ambulatory Visit: Payer: Self-pay | Admitting: Nurse Practitioner

## 2016-01-15 DIAGNOSIS — R278 Other lack of coordination: Secondary | ICD-10-CM

## 2016-01-24 ENCOUNTER — Ambulatory Visit
Admission: RE | Admit: 2016-01-24 | Discharge: 2016-01-24 | Disposition: A | Discharge status: Home / Self Care | Payer: Medicare Other | Source: Ambulatory Visit | Attending: Nurse Practitioner | Admitting: Nurse Practitioner

## 2016-01-24 DIAGNOSIS — I6782 Cerebral ischemia: Secondary | ICD-10-CM | POA: Diagnosis not present

## 2016-01-24 DIAGNOSIS — M4312 Spondylolisthesis, cervical region: Secondary | ICD-10-CM | POA: Diagnosis not present

## 2016-01-24 DIAGNOSIS — R278 Other lack of coordination: Secondary | ICD-10-CM | POA: Diagnosis not present

## 2016-02-20 DIAGNOSIS — E1142 Type 2 diabetes mellitus with diabetic polyneuropathy: Secondary | ICD-10-CM | POA: Insufficient documentation

## 2016-10-22 DIAGNOSIS — E042 Nontoxic multinodular goiter: Secondary | ICD-10-CM | POA: Insufficient documentation

## 2016-11-12 ENCOUNTER — Other Ambulatory Visit: Payer: Self-pay | Admitting: Unknown Physician Specialty

## 2016-11-12 DIAGNOSIS — M7551 Bursitis of right shoulder: Secondary | ICD-10-CM

## 2016-11-18 ENCOUNTER — Ambulatory Visit
Admission: RE | Admit: 2016-11-18 | Discharge: 2016-11-18 | Disposition: A | Discharge status: Home / Self Care | Payer: Medicare Other | Source: Ambulatory Visit | Attending: Unknown Physician Specialty | Admitting: Unknown Physician Specialty

## 2016-11-18 DIAGNOSIS — M75121 Complete rotator cuff tear or rupture of right shoulder, not specified as traumatic: Secondary | ICD-10-CM | POA: Insufficient documentation

## 2016-11-18 DIAGNOSIS — M7551 Bursitis of right shoulder: Secondary | ICD-10-CM | POA: Diagnosis present

## 2016-11-18 DIAGNOSIS — M7521 Bicipital tendinitis, right shoulder: Secondary | ICD-10-CM | POA: Diagnosis not present

## 2016-11-18 DIAGNOSIS — M25311 Other instability, right shoulder: Secondary | ICD-10-CM | POA: Diagnosis present

## 2016-11-18 DIAGNOSIS — M25711 Osteophyte, right shoulder: Secondary | ICD-10-CM | POA: Insufficient documentation

## 2016-11-18 DIAGNOSIS — M25511 Pain in right shoulder: Secondary | ICD-10-CM | POA: Diagnosis present

## 2016-12-01 ENCOUNTER — Other Ambulatory Visit: Payer: Self-pay | Admitting: Orthopedic Surgery

## 2016-12-01 DIAGNOSIS — M25511 Pain in right shoulder: Secondary | ICD-10-CM

## 2016-12-05 IMAGING — MR MR HEAD W/O CM
8 of 10 series · 33 of 48 positions shown · non-contrast
Comparison: None.

CLINICAL DATA: Difficulty with balance.  Sensory ataxia.

EXAM:
MRI HEAD WITHOUT CONTRAST
TECHNIQUE: Multiplanar, multiecho pulse sequences of the brain and surrounding
structures were obtained without intravenous contrast.

[Series 2: T1 · sagittal · 5.0mm · 0.45mm/px · 3 of 29 slices shown (1 of 2)]
[im 1/29]
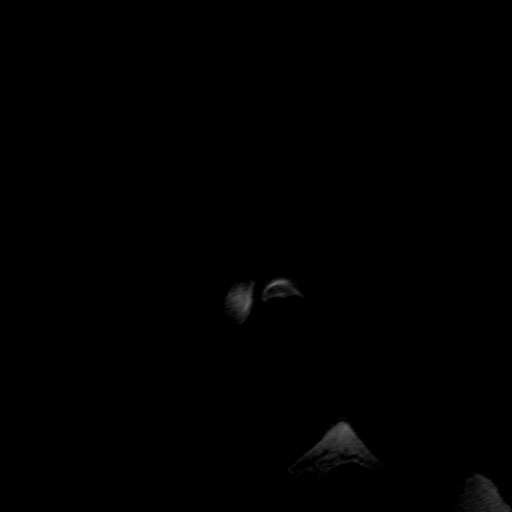
[im 15/29]
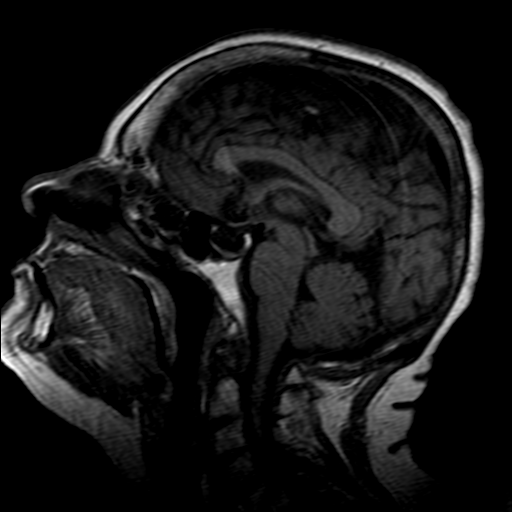
[im 29/29]
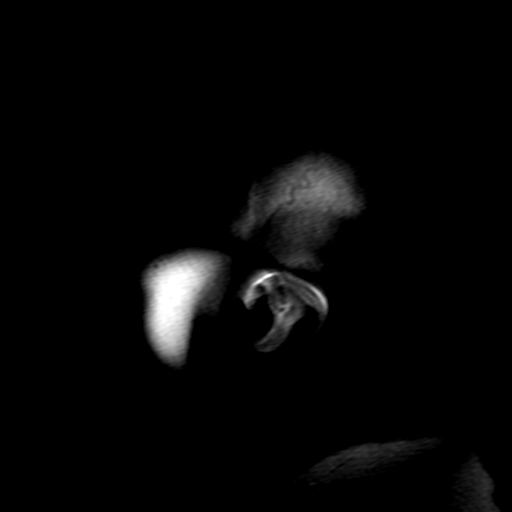

[Series 4: DWI · axial · 4.0mm · 0.94mm/px · z∈[-38,+114]mm · 5 of 43 slices shown (1 of 2)]
[im 1/43]
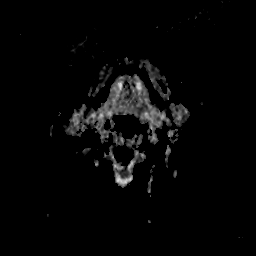
[im 11/43]
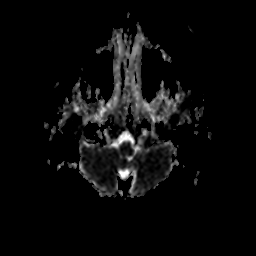
[im 22/43]
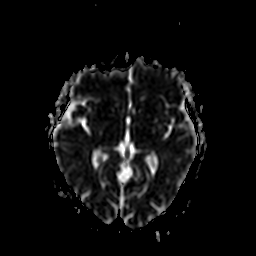
[im 32/43]
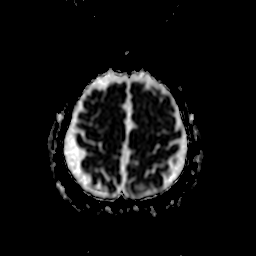
[im 43/43]
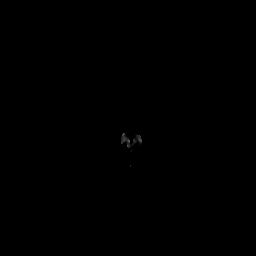

[Series 7: DWI · coronal · 5.0mm · 1.80mm/px · 5 of 35 slices shown (2 of 2)]
[im 1/35]
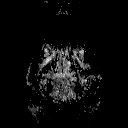
[im 9/35]
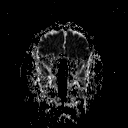
[im 18/35]
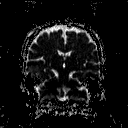
[im 26/35]
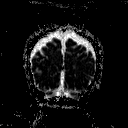
[im 35/35]
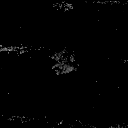

[Series 9: T2 · axial · 5.0mm · 0.45mm/px · z∈[-37,+111]mm · 4 of 27 slices shown (1 of 3)]
[im 1/27]
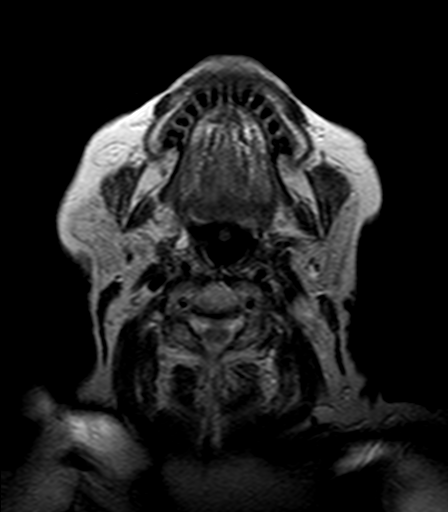
[im 9/27]
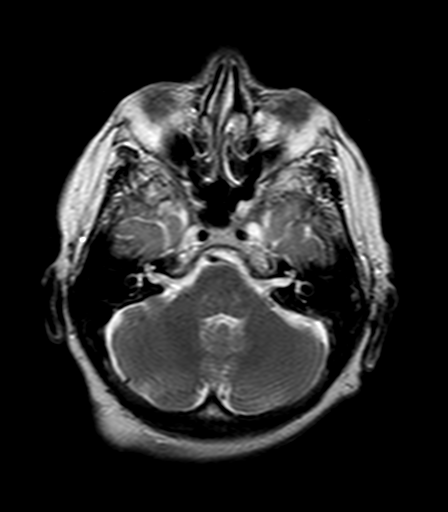
[im 18/27]
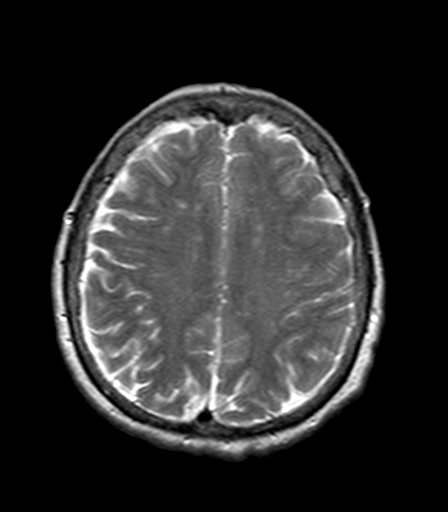
[im 27/27]
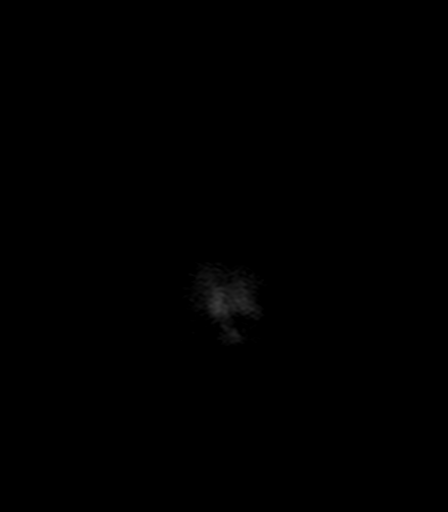

[Series 10: FLAIR · axial · 5.0mm · 0.90mm/px · z∈[-38,+111]mm · 4 of 27 slices shown]
[im 1/27]
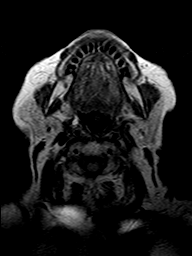
[im 9/27]
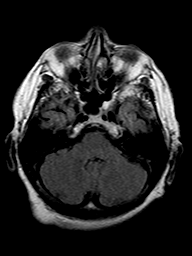
[im 18/27]
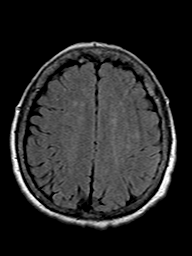
[im 27/27]
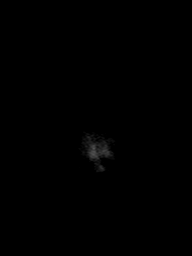

[Series 11: T2 · axial · 5.0mm · 0.45mm/px · z∈[-37,+111]mm · 4 of 27 slices shown (2 of 3)]
[im 1/27]
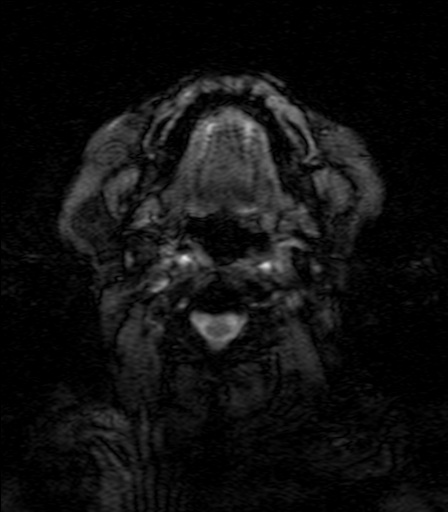
[im 9/27]
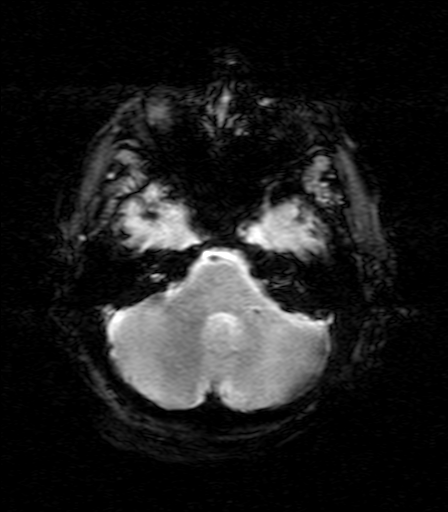
[im 18/27]
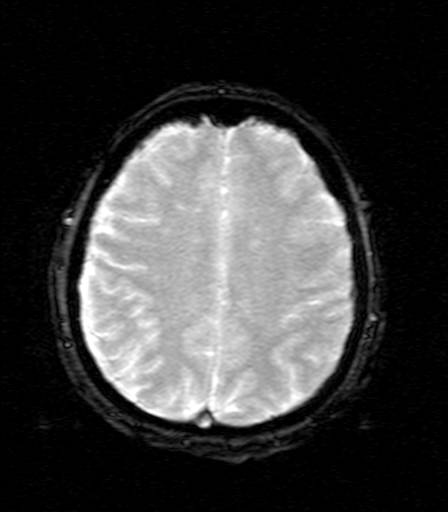
[im 27/27]
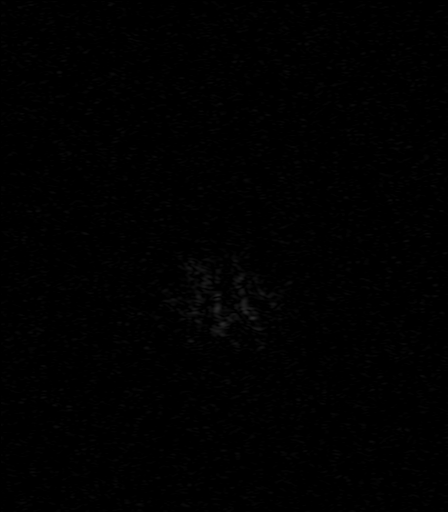

[Series 12: T1 · axial · 3.0mm · 0.45mm/px · z∈[-41,+25]mm · 4 of 60 slices shown (2 of 2)]
[im 1/60]
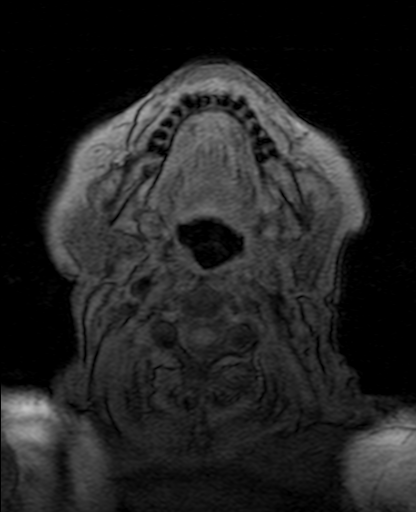
[im 9/60]
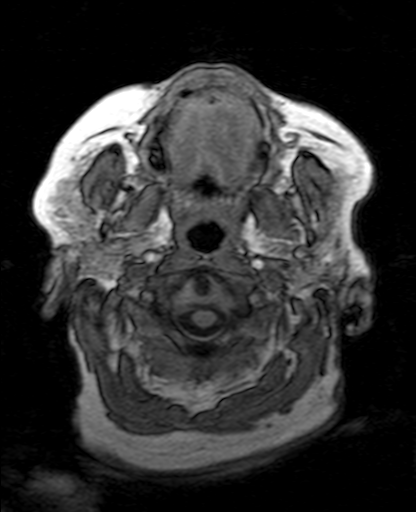
[im 17/60]
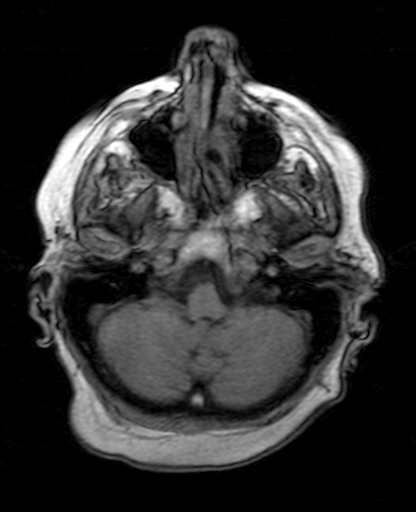
[im 26/60]
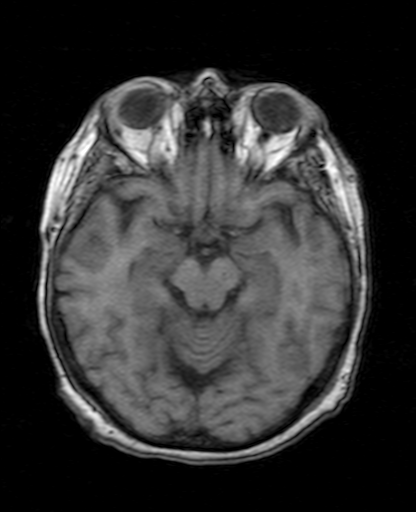

[Series 13: T2 · coronal · 5.0mm · 0.45mm/px · 4 of 27 slices shown (3 of 3)]
[im 1/27]
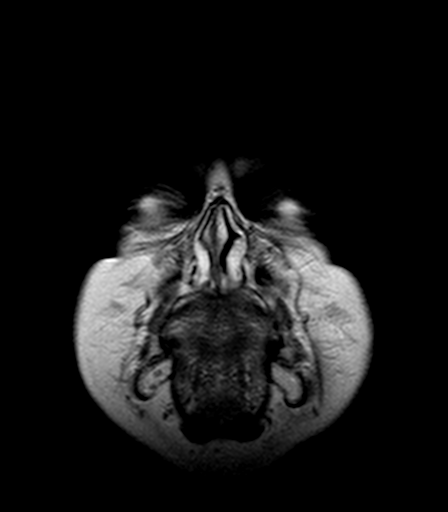
[im 9/27]
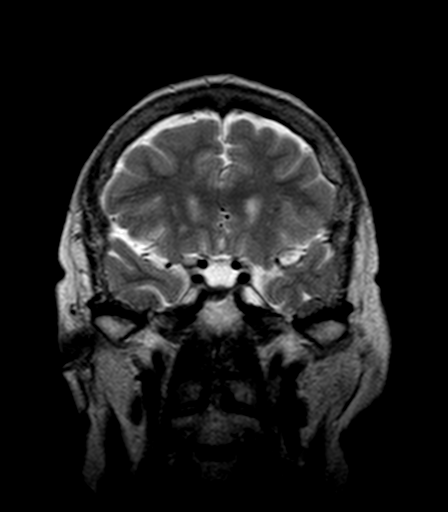
[im 18/27]
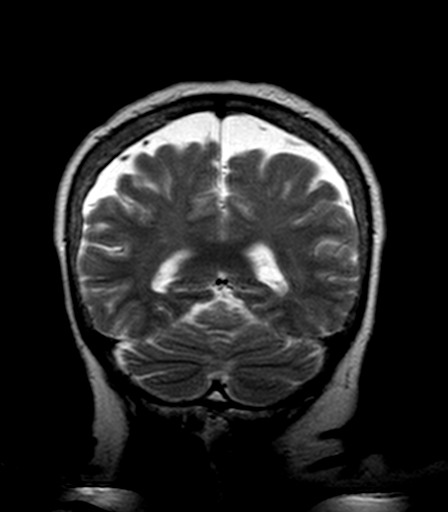
[im 27/27]
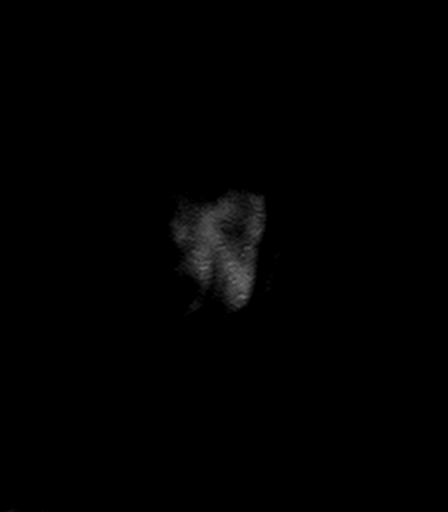

[33 of 48 positions shown; findings below may reference images not displayed]

FINDINGS: Brain: No acute infarct or intraparenchymal hemorrhage. There is a
partially empty sella. There is multifocal hyperintense T2-weighted
signal within the periventricular white matter, most often seen in
the setting of chronic microvascular ischemia. No mass lesion or
midline shift. No hydrocephalus or extra-axial fluid collection. No
age advanced or lobar predominant atrophy. There are bilateral
choroid plexus xanthogranulomas, which are benign and not uncommon.

Vascular: Major intracranial arterial and venous sinus flow voids
are preserved. No evidence of chronic microhemorrhage or amyloid
angiopathy.

Skull and upper cervical spine: There is grade 2 anterolisthesis at
C5-C6, incompletely visualized. The visualized skull base, calvarium
and extracranial soft tissues are normal.

Sinuses/Orbits: No fluid levels or advanced mucosal thickening. No
mastoid effusion. Bilateral lens replacements.
IMPRESSION: 1. No acute intracranial abnormality or specific finding to explain
the reported ataxia.
2. Mild findings of chronic microvascular ischemia.
3. Grade 2 anterolisthesis of C5 on C6, incompletely visualized.

## 2016-12-10 ENCOUNTER — Ambulatory Visit
Admission: RE | Admit: 2016-12-10 | Discharge: 2016-12-10 | Disposition: A | Discharge status: Home / Self Care | Payer: Medicare Other | Source: Ambulatory Visit | Attending: Orthopedic Surgery | Admitting: Orthopedic Surgery

## 2016-12-10 DIAGNOSIS — M7581 Other shoulder lesions, right shoulder: Secondary | ICD-10-CM | POA: Diagnosis not present

## 2016-12-10 DIAGNOSIS — I700 Atherosclerosis of aorta: Secondary | ICD-10-CM | POA: Diagnosis not present

## 2016-12-10 DIAGNOSIS — M25511 Pain in right shoulder: Secondary | ICD-10-CM | POA: Insufficient documentation

## 2016-12-10 DIAGNOSIS — S42141A Displaced fracture of glenoid cavity of scapula, right shoulder, initial encounter for closed fracture: Secondary | ICD-10-CM | POA: Diagnosis not present

## 2016-12-10 DIAGNOSIS — M47892 Other spondylosis, cervical region: Secondary | ICD-10-CM | POA: Insufficient documentation

## 2016-12-10 DIAGNOSIS — X58XXXA Exposure to other specified factors, initial encounter: Secondary | ICD-10-CM | POA: Insufficient documentation

## 2016-12-24 ENCOUNTER — Encounter
Admission: RE | Admit: 2016-12-24 | Discharge: 2016-12-24 | Disposition: A | Discharge status: Home / Self Care | Payer: Medicare Other | Source: Ambulatory Visit | Attending: Orthopedic Surgery | Admitting: Orthopedic Surgery

## 2016-12-24 DIAGNOSIS — Z888 Allergy status to other drugs, medicaments and biological substances status: Secondary | ICD-10-CM | POA: Insufficient documentation

## 2016-12-24 DIAGNOSIS — Z01812 Encounter for preprocedural laboratory examination: Secondary | ICD-10-CM | POA: Diagnosis present

## 2016-12-24 DIAGNOSIS — Z0183 Encounter for blood typing: Secondary | ICD-10-CM | POA: Insufficient documentation

## 2016-12-24 DIAGNOSIS — M25511 Pain in right shoulder: Secondary | ICD-10-CM | POA: Diagnosis not present

## 2016-12-24 DIAGNOSIS — Z881 Allergy status to other antibiotic agents status: Secondary | ICD-10-CM | POA: Diagnosis not present

## 2016-12-24 LAB — CBC
HEMATOCRIT: 46 % (ref 35.0–47.0)
Hemoglobin: 14.6 g/dL (ref 12.0–16.0)
MCH: 32.6 pg (ref 26.0–34.0)
MCHC: 31.8 g/dL — AB (ref 32.0–36.0)
MCV: 102.6 fL — ABNORMAL HIGH (ref 80.0–100.0)
Platelets: 330 10*3/uL (ref 150–440)
RBC: 4.48 MIL/uL (ref 3.80–5.20)
RDW: 19.7 % — AB (ref 11.5–14.5)
WBC: 10 10*3/uL (ref 3.6–11.0)

## 2016-12-24 LAB — BASIC METABOLIC PANEL
ANION GAP: 13 (ref 5–15)
BUN: 37 mg/dL — ABNORMAL HIGH (ref 6–20)
CHLORIDE: 97 mmol/L — AB (ref 101–111)
CO2: 26 mmol/L (ref 22–32)
Calcium: 9.6 mg/dL (ref 8.9–10.3)
Creatinine, Ser: 1.25 mg/dL — ABNORMAL HIGH (ref 0.44–1.00)
GFR calc Af Amer: 48 mL/min — ABNORMAL LOW (ref >60)
GFR calc non Af Amer: 41 mL/min — ABNORMAL LOW (ref >60)
Glucose, Bld: 147 mg/dL — ABNORMAL HIGH (ref 65–99)
POTASSIUM: 5.3 mmol/L — AB (ref 3.5–5.1)
SODIUM: 136 mmol/L (ref 135–145)

## 2016-12-24 LAB — URINALYSIS, ROUTINE W REFLEX MICROSCOPIC
Bilirubin Urine: NEGATIVE
GLUCOSE, UA: NEGATIVE mg/dL
Hgb urine dipstick: NEGATIVE
KETONES UR: NEGATIVE mg/dL
LEUKOCYTES UA: NEGATIVE
NITRITE: NEGATIVE
PROTEIN: NEGATIVE mg/dL
Specific Gravity, Urine: 1.01 (ref 1.005–1.030)
pH: 5 (ref 5.0–8.0)

## 2016-12-24 LAB — TYPE AND SCREEN
ABO/RH(D): O POS
Antibody Screen: NEGATIVE

## 2016-12-24 LAB — SURGICAL PCR SCREEN
MRSA, PCR: NEGATIVE
STAPHYLOCOCCUS AUREUS: POSITIVE — AB

## 2016-12-24 LAB — PROTIME-INR
INR: 0.89
PROTHROMBIN TIME: 12 s (ref 11.4–15.2)

## 2016-12-24 NOTE — Patient Instructions (Signed)
Your procedure is scheduled on: Friday Nov. 9, 2018. Report to Same Day Surgery To find out your arrival time please call [phone] between 1PM - 3PM on Thursday Nov. 8, 2018.  Remember: Instructions that are not followed completely may result in serious medical risk, up to and including death, or upon the discretion of your surgeon and anesthesiologist your surgery may need to be rescheduled.     _X__ 1. Do not eat food after midnight the night before your procedure.                 No gum chewing or hard candies. You may drink clear liquids which is water up to 2 hours                 before you are scheduled to arrive for your surgery- DO not drink clear                 liquids within 2 hours of the start of your surgery.              .     _X__ 2.  No Alcohol for 24 hours before or after surgery.   _X__ 3.  Do Not Smoke or use e-cigarettes For 24 Hours Prior to Your Surgery.                 Do not use any chewable tobacco products for at least 6 hours prior to                 surgery.  ____  4.  Bring all medications with you on the day of surgery if instructed.   __x__  5.  Notify your doctor if there is any change in your medical condition      (cold, fever, infections).     Do not wear jewelry, make-up, hairpins, clips or nail polish. Do not wear lotions, powders, or perfumes. You may wear deodorant. Do not shave 48 hours prior to surgery. Men may shave face and neck. Do not bring valuables to the hospital.    [facility] is not responsible for any belongings or valuables.  Contacts, dentures or bridgework may not be worn into surgery. Leave your suitcase in the car. After surgery it may be brought to your room. For patients admitted to the hospital, discharge time is determined by your treatment team.   Patients discharged the day of surgery will not be allowed to drive home.   Please read over the following fact sheets that you were given:    Preparing for surgery.  __x__ Take these medicines the morning of surgery with A SIP OF WATER:    1. metoprolol succinate (TOPROL-XL)  2. omeprazole (PRILOSEC) take at bedtime the night prior to surgery and the am of surgery.  3. oxyCODONE (ROXICODONE) optional  4. theophylline (THEO-24)   ____ Fleet Enema (as directed)   __x__ Use CHG Soap as directed  __x_ Use inhalers on the day of surgery  ____ Stop metformin 2 days prior to surgery    ____ Take 1/2 of usual insulin dose the night before surgery. No insulin the morning          of surgery.   ____ Stop Coumadin/Plavix/aspirin on does not apply.  ____ Stop Anti-inflammatories on does not apply.   ____ Stop supplements until after surgery.    ____ Bring C-Pap to the hospital.

## 2016-12-24 NOTE — Pre-Procedure Instructions (Signed)
EKG EKG demonstrated normal sinus rhythm, nonspecific ST and T waves changes.  Assessment and Plan   76 y.o. female with  ICD-10-CM ICD-9-CM  1. Essential hypertension, benign-blood pressure is currently fairly well controlled with Toprol-XL at 100 mg daily, losartan 100 mg daily, furosemide 20 mg daily, and felodipine at 10 mg nightly. Will continue with this regimen as well as low-sodium diet. I10 401.1  E78.4 272.4  3. Severe chronic obstructive pulmonary disease (CMS-HCC)-use of CPAP is recommended and smoking cessation. Oxygen 24 7. J44.9 496  4. Obstructive sleep apnea syndrome G47.33 327.23   5. Arrhythmia-no further SVT or atrial fibrillation noted. Continue with his current regimen as it appears to be working well for her without symptoms.  Return in about 6 months (around 05/20/2017).  These notes generated with voice recognition software. I apologize for typographical errors.  Denton ArKENNETH ALAN FATH, MD

## 2016-12-24 NOTE — Pre-Procedure Instructions (Signed)
Pt's potassium level was 5.3 today along with elevated BUN, Creatinine  and decreased GFR.  Pt has a history of renal insufficiency.  Notified Dr. Maisie Fushomas, he ordered to recheck potassium the am of surgery and fax results to Dr. Allena KatzPatel.

## 2016-12-25 LAB — URINE CULTURE

## 2017-01-08 ENCOUNTER — Inpatient Hospital Stay: Payer: Medicare Other | Admitting: Anesthesiology

## 2017-01-08 ENCOUNTER — Other Ambulatory Visit: Payer: Self-pay

## 2017-01-08 ENCOUNTER — Inpatient Hospital Stay: Payer: Medicare Other

## 2017-01-08 ENCOUNTER — Encounter: Payer: Self-pay | Admitting: *Deleted

## 2017-01-08 ENCOUNTER — Inpatient Hospital Stay
Admission: RE | Admit: 2017-01-08 | Discharge: 2017-01-09 | DRG: 483 | Disposition: A | Discharge status: Home Health | Payer: Medicare Other | Source: Ambulatory Visit | Attending: Orthopedic Surgery | Admitting: Orthopedic Surgery

## 2017-01-08 ENCOUNTER — Encounter
Admission: RE | Disposition: A | Discharge status: Home Health | Payer: Self-pay | Source: Ambulatory Visit | Attending: Orthopedic Surgery

## 2017-01-08 DIAGNOSIS — N183 Chronic kidney disease, stage 3 (moderate): Secondary | ICD-10-CM | POA: Diagnosis present

## 2017-01-08 DIAGNOSIS — E7800 Pure hypercholesterolemia, unspecified: Secondary | ICD-10-CM | POA: Diagnosis present

## 2017-01-08 DIAGNOSIS — J449 Chronic obstructive pulmonary disease, unspecified: Secondary | ICD-10-CM | POA: Diagnosis present

## 2017-01-08 DIAGNOSIS — G2581 Restless legs syndrome: Secondary | ICD-10-CM | POA: Diagnosis present

## 2017-01-08 DIAGNOSIS — F17210 Nicotine dependence, cigarettes, uncomplicated: Secondary | ICD-10-CM | POA: Diagnosis present

## 2017-01-08 DIAGNOSIS — S46111A Strain of muscle, fascia and tendon of long head of biceps, right arm, initial encounter: Secondary | ICD-10-CM | POA: Diagnosis present

## 2017-01-08 DIAGNOSIS — Z881 Allergy status to other antibiotic agents status: Secondary | ICD-10-CM | POA: Diagnosis not present

## 2017-01-08 DIAGNOSIS — X58XXXA Exposure to other specified factors, initial encounter: Secondary | ICD-10-CM | POA: Diagnosis present

## 2017-01-08 DIAGNOSIS — M67411 Ganglion, right shoulder: Secondary | ICD-10-CM | POA: Diagnosis present

## 2017-01-08 DIAGNOSIS — M19011 Primary osteoarthritis, right shoulder: Secondary | ICD-10-CM | POA: Diagnosis present

## 2017-01-08 DIAGNOSIS — Z96619 Presence of unspecified artificial shoulder joint: Secondary | ICD-10-CM

## 2017-01-08 DIAGNOSIS — Z79899 Other long term (current) drug therapy: Secondary | ICD-10-CM

## 2017-01-08 DIAGNOSIS — G8929 Other chronic pain: Secondary | ICD-10-CM | POA: Diagnosis present

## 2017-01-08 DIAGNOSIS — E1143 Type 2 diabetes mellitus with diabetic autonomic (poly)neuropathy: Secondary | ICD-10-CM | POA: Diagnosis present

## 2017-01-08 DIAGNOSIS — K219 Gastro-esophageal reflux disease without esophagitis: Secondary | ICD-10-CM | POA: Diagnosis present

## 2017-01-08 DIAGNOSIS — I129 Hypertensive chronic kidney disease with stage 1 through stage 4 chronic kidney disease, or unspecified chronic kidney disease: Secondary | ICD-10-CM | POA: Diagnosis present

## 2017-01-08 DIAGNOSIS — M109 Gout, unspecified: Secondary | ICD-10-CM | POA: Diagnosis present

## 2017-01-08 DIAGNOSIS — E1122 Type 2 diabetes mellitus with diabetic chronic kidney disease: Secondary | ICD-10-CM | POA: Diagnosis present

## 2017-01-08 DIAGNOSIS — Z9981 Dependence on supplemental oxygen: Secondary | ICD-10-CM

## 2017-01-08 DIAGNOSIS — K3184 Gastroparesis: Secondary | ICD-10-CM | POA: Diagnosis present

## 2017-01-08 DIAGNOSIS — M75121 Complete rotator cuff tear or rupture of right shoulder, not specified as traumatic: Secondary | ICD-10-CM | POA: Diagnosis present

## 2017-01-08 DIAGNOSIS — Z888 Allergy status to other drugs, medicaments and biological substances status: Secondary | ICD-10-CM | POA: Diagnosis not present

## 2017-01-08 HISTORY — PX: REVERSE SHOULDER ARTHROPLASTY: SHX5054

## 2017-01-08 LAB — TYPE AND SCREEN
ABO/RH(D): O POS
ANTIBODY SCREEN: NEGATIVE

## 2017-01-08 LAB — POCT I-STAT 4, (NA,K, GLUC, HGB,HCT)
Glucose, Bld: 132 mg/dL — ABNORMAL HIGH (ref 65–99)
HCT: 40 % (ref 36.0–46.0)
Hemoglobin: 13.6 g/dL (ref 12.0–15.0)
Potassium: 4.9 mmol/L (ref 3.5–5.1)
Sodium: 138 mmol/L (ref 135–145)

## 2017-01-08 LAB — GLUCOSE, CAPILLARY
GLUCOSE-CAPILLARY: 128 mg/dL — AB (ref 65–99)
Glucose-Capillary: 108 mg/dL — ABNORMAL HIGH (ref 65–99)

## 2017-01-08 SURGERY — ARTHROPLASTY, SHOULDER, TOTAL, REVERSE
Anesthesia: General | Site: Shoulder | Laterality: Right | Wound class: Clean

## 2017-01-08 MED ORDER — ALBUTEROL SULFATE (2.5 MG/3ML) 0.083% IN NEBU: 2.5000 mg | INHALATION_SOLUTION | Freq: Four times a day (QID) | RESPIRATORY_TRACT | Status: DC | PRN

## 2017-01-08 MED ORDER — BUPIVACAINE LIPOSOME 1.3 % IJ SUSP
INTRAMUSCULAR | Status: DC | PRN
  Administered 2017-01-08: 20 mL

## 2017-01-08 MED ORDER — NEOMYCIN-POLYMYXIN B GU 40-200000 IR SOLN
Status: AC
  Filled 2017-01-08: qty 20

## 2017-01-08 MED ORDER — SUCCINYLCHOLINE CHLORIDE 20 MG/ML IJ SOLN
INTRAMUSCULAR | Status: AC
  Filled 2017-01-08: qty 1

## 2017-01-08 MED ORDER — CEFAZOLIN SODIUM 1 G IJ SOLR
INTRAMUSCULAR | Status: AC
  Filled 2017-01-08: qty 20

## 2017-01-08 MED ORDER — ACETAMINOPHEN 10 MG/ML IV SOLN
INTRAVENOUS | Status: DC | PRN
  Administered 2017-01-08: 1000 mg via INTRAVENOUS

## 2017-01-08 MED ORDER — NEOMYCIN-POLYMYXIN B GU IR SOLN
Status: DC | PRN
  Administered 2017-01-08: 16 mL

## 2017-01-08 MED ORDER — METHOCARBAMOL 1000 MG/10ML IJ SOLN
500.0000 mg | Freq: Four times a day (QID) | INTRAVENOUS | Status: DC | PRN
  Filled 2017-01-08: qty 5

## 2017-01-08 MED ORDER — SUCCINYLCHOLINE CHLORIDE 20 MG/ML IJ SOLN
INTRAMUSCULAR | Status: DC | PRN
  Administered 2017-01-08: 100 mg via INTRAVENOUS

## 2017-01-08 MED ORDER — EZETIMIBE 10 MG PO TABS
10.0000 mg | ORAL_TABLET | Freq: Every day | ORAL | Status: DC
  Administered 2017-01-08: 10 mg via ORAL
  Filled 2017-01-08 (×2): qty 1

## 2017-01-08 MED ORDER — ONDANSETRON HCL 4 MG/2ML IJ SOLN
INTRAMUSCULAR | Status: DC | PRN
  Administered 2017-01-08: 4 mg via INTRAVENOUS

## 2017-01-08 MED ORDER — TRANEXAMIC ACID 1000 MG/10ML IV SOLN
1000.0000 mg | Freq: Once | INTRAVENOUS | Status: AC
  Administered 2017-01-08: 1000 mg via INTRAVENOUS
  Filled 2017-01-08: qty 10

## 2017-01-08 MED ORDER — FENTANYL CITRATE (PF) 100 MCG/2ML IJ SOLN: 25.0000 ug | INTRAMUSCULAR | Status: DC | PRN

## 2017-01-08 MED ORDER — PHENYLEPHRINE HCL 10 MG/ML IJ SOLN
INTRAMUSCULAR | Status: AC
  Filled 2017-01-08: qty 1

## 2017-01-08 MED ORDER — PROMETHAZINE HCL 25 MG/ML IJ SOLN: 6.2500 mg | INTRAMUSCULAR | Status: DC | PRN

## 2017-01-08 MED ORDER — SODIUM CHLORIDE 0.9 % IJ SOLN
INTRAMUSCULAR | Status: AC
  Filled 2017-01-08: qty 50

## 2017-01-08 MED ORDER — HYDROMORPHONE HCL 1 MG/ML IJ SOLN
0.5000 mg | INTRAMUSCULAR | Status: DC | PRN
  Administered 2017-01-08 – 2017-01-09 (×5): 0.5 mg via INTRAVENOUS
  Filled 2017-01-08 (×5): qty 1

## 2017-01-08 MED ORDER — ALLOPURINOL 100 MG PO TABS
200.0000 mg | ORAL_TABLET | Freq: Every day | ORAL | Status: DC
Start: 2017-01-09 — End: 2017-01-09
  Administered 2017-01-09: 200 mg via ORAL
  Filled 2017-01-08: qty 2

## 2017-01-08 MED ORDER — METOCLOPRAMIDE HCL 5 MG/ML IJ SOLN: 5.0000 mg | Freq: Three times a day (TID) | INTRAMUSCULAR | Status: DC | PRN

## 2017-01-08 MED ORDER — LIDOCAINE HCL (PF) 4 % IJ SOLN
INTRAMUSCULAR | Status: DC | PRN
  Administered 2017-01-08: 1 mL

## 2017-01-08 MED ORDER — ACETAMINOPHEN 325 MG PO TABS: 650.0000 mg | ORAL_TABLET | ORAL | Status: DC | PRN

## 2017-01-08 MED ORDER — FENTANYL CITRATE (PF) 100 MCG/2ML IJ SOLN
INTRAMUSCULAR | Status: AC
  Administered 2017-01-08: 50 ug via INTRAVENOUS
  Filled 2017-01-08: qty 2

## 2017-01-08 MED ORDER — TRANEXAMIC ACID 1000 MG/10ML IV SOLN
INTRAVENOUS | Status: AC
  Filled 2017-01-08: qty 10

## 2017-01-08 MED ORDER — LIDOCAINE HCL (CARDIAC) 20 MG/ML IV SOLN
INTRAVENOUS | Status: DC | PRN
  Administered 2017-01-08: 40 mg via INTRAVENOUS

## 2017-01-08 MED ORDER — METOCLOPRAMIDE HCL 10 MG PO TABS: 5.0000 mg | ORAL_TABLET | Freq: Three times a day (TID) | ORAL | Status: DC | PRN

## 2017-01-08 MED ORDER — MEPERIDINE HCL 50 MG/ML IJ SOLN: 6.2500 mg | INTRAMUSCULAR | Status: DC | PRN

## 2017-01-08 MED ORDER — ALUM & MAG HYDROXIDE-SIMETH 200-200-20 MG/5ML PO SUSP: 30.0000 mL | ORAL | Status: DC | PRN

## 2017-01-08 MED ORDER — ONDANSETRON HCL 4 MG/2ML IJ SOLN: 4.0000 mg | Freq: Four times a day (QID) | INTRAMUSCULAR | Status: DC | PRN

## 2017-01-08 MED ORDER — PROPOFOL 10 MG/ML IV BOLUS
INTRAVENOUS | Status: DC | PRN
  Administered 2017-01-08: 110 mg via INTRAVENOUS

## 2017-01-08 MED ORDER — ACETAMINOPHEN 500 MG PO TABS
1000.0000 mg | ORAL_TABLET | Freq: Three times a day (TID) | ORAL | Status: AC
  Administered 2017-01-08 – 2017-01-09 (×3): 1000 mg via ORAL
  Filled 2017-01-08 (×3): qty 2

## 2017-01-08 MED ORDER — METOPROLOL SUCCINATE ER 100 MG PO TB24
100.0000 mg | ORAL_TABLET | Freq: Once | ORAL | Status: AC
Start: 2017-01-08 — End: 2017-01-08
  Administered 2017-01-08: 100 mg via ORAL
  Filled 2017-01-08 (×2): qty 1

## 2017-01-08 MED ORDER — ALBUTEROL SULFATE HFA 108 (90 BASE) MCG/ACT IN AERS: 2.0000 | INHALATION_SPRAY | Freq: Four times a day (QID) | RESPIRATORY_TRACT | Status: DC | PRN

## 2017-01-08 MED ORDER — LOSARTAN POTASSIUM 50 MG PO TABS
100.0000 mg | ORAL_TABLET | Freq: Every day | ORAL | Status: DC
  Administered 2017-01-09: 100 mg via ORAL
  Filled 2017-01-08: qty 2

## 2017-01-08 MED ORDER — SUGAMMADEX SODIUM 200 MG/2ML IV SOLN
INTRAVENOUS | Status: DC | PRN
  Administered 2017-01-08: 150 mg via INTRAVENOUS

## 2017-01-08 MED ORDER — THROMBIN (RECOMBINANT) 5000 UNITS EX SOLR
CUTANEOUS | Status: AC
  Filled 2017-01-08: qty 5000

## 2017-01-08 MED ORDER — LIDOCAINE HCL (PF) 2 % IJ SOLN
INTRAMUSCULAR | Status: AC
  Filled 2017-01-08: qty 10

## 2017-01-08 MED ORDER — TRANEXAMIC ACID 1000 MG/10ML IV SOLN
INTRAVENOUS | Status: DC | PRN
  Administered 2017-01-08: 1000 mg via INTRAVENOUS

## 2017-01-08 MED ORDER — FUROSEMIDE 20 MG PO TABS
20.0000 mg | ORAL_TABLET | Freq: Every day | ORAL | Status: DC | PRN
  Administered 2017-01-09: 40 mg via ORAL
  Filled 2017-01-08: qty 2

## 2017-01-08 MED ORDER — OXYCODONE HCL 5 MG PO TABS: 15.0000 mg | ORAL_TABLET | Freq: Every day | ORAL | Status: DC

## 2017-01-08 MED ORDER — TIOTROPIUM BROMIDE MONOHYDRATE 18 MCG IN CAPS
18.0000 ug | ORAL_CAPSULE | Freq: Every day | RESPIRATORY_TRACT | Status: DC
  Administered 2017-01-09: 18 ug via RESPIRATORY_TRACT
  Filled 2017-01-08: qty 5

## 2017-01-08 MED ORDER — DOCUSATE SODIUM 100 MG PO CAPS
100.0000 mg | ORAL_CAPSULE | Freq: Two times a day (BID) | ORAL | Status: DC
  Administered 2017-01-08 – 2017-01-09 (×2): 100 mg via ORAL
  Filled 2017-01-08 (×3): qty 1

## 2017-01-08 MED ORDER — FENTANYL CITRATE (PF) 100 MCG/2ML IJ SOLN
INTRAMUSCULAR | Status: AC
  Filled 2017-01-08: qty 2

## 2017-01-08 MED ORDER — PHENOL 1.4 % MT LIQD
1.0000 | OROMUCOSAL | Status: DC | PRN
  Filled 2017-01-08: qty 177

## 2017-01-08 MED ORDER — CEFAZOLIN SODIUM-DEXTROSE 2-3 GM-%(50ML) IV SOLR
INTRAVENOUS | Status: DC | PRN
  Administered 2017-01-08 (×2): 2 g via INTRAVENOUS

## 2017-01-08 MED ORDER — FAMOTIDINE 20 MG PO TABS
20.0000 mg | ORAL_TABLET | Freq: Once | ORAL | Status: AC
  Administered 2017-01-08: 20 mg via ORAL

## 2017-01-08 MED ORDER — FENTANYL CITRATE (PF) 100 MCG/2ML IJ SOLN
25.0000 ug | INTRAMUSCULAR | Status: DC | PRN
  Administered 2017-01-08 (×5): 50 ug via INTRAVENOUS

## 2017-01-08 MED ORDER — BUPIVACAINE-EPINEPHRINE (PF) 0.25% -1:200000 IJ SOLN
INTRAMUSCULAR | Status: DC | PRN
  Administered 2017-01-08: 30 mL via PERINEURAL

## 2017-01-08 MED ORDER — FELODIPINE ER 5 MG PO TB24
10.0000 mg | ORAL_TABLET | Freq: Every day | ORAL | Status: DC
  Administered 2017-01-08: 10 mg via ORAL
  Filled 2017-01-08: qty 2
  Filled 2017-01-08: qty 1

## 2017-01-08 MED ORDER — TRANEXAMIC ACID 1000 MG/10ML IV SOLN
INTRAVENOUS | Status: AC | PRN
  Administered 2017-01-08: 1000 mg via INTRAVENOUS

## 2017-01-08 MED ORDER — DICLOFENAC EPOLAMINE 1.3 % TD PTCH
1.0000 | MEDICATED_PATCH | Freq: Every day | TRANSDERMAL | Status: DC | PRN
  Filled 2017-01-08: qty 1

## 2017-01-08 MED ORDER — FENTANYL CITRATE (PF) 250 MCG/5ML IJ SOLN
INTRAMUSCULAR | Status: AC
  Filled 2017-01-08: qty 5

## 2017-01-08 MED ORDER — SODIUM CHLORIDE 0.9 % IV SOLN
INTRAVENOUS | Status: DC
  Administered 2017-01-08: 15:00:00 via INTRAVENOUS

## 2017-01-08 MED ORDER — OXYCODONE HCL 5 MG PO TABS
ORAL_TABLET | ORAL | Status: AC
  Filled 2017-01-08: qty 1

## 2017-01-08 MED ORDER — OXYCODONE HCL 5 MG PO TABS
10.0000 mg | ORAL_TABLET | ORAL | Status: DC | PRN
Start: 2017-01-08 — End: 2017-01-08

## 2017-01-08 MED ORDER — OXYCODONE HCL 5 MG PO TABS
5.0000 mg | ORAL_TABLET | Freq: Once | ORAL | Status: AC | PRN
  Administered 2017-01-08: 5 mg via ORAL

## 2017-01-08 MED ORDER — METOPROLOL SUCCINATE ER 50 MG PO TB24
100.0000 mg | ORAL_TABLET | Freq: Every day | ORAL | Status: DC
  Administered 2017-01-09: 100 mg via ORAL
  Filled 2017-01-08 (×2): qty 2

## 2017-01-08 MED ORDER — THEOPHYLLINE ER 400 MG PO TB24
400.0000 mg | ORAL_TABLET | Freq: Every day | ORAL | Status: DC
  Filled 2017-01-08: qty 1

## 2017-01-08 MED ORDER — FENTANYL CITRATE (PF) 100 MCG/2ML IJ SOLN
INTRAMUSCULAR | Status: DC | PRN
  Administered 2017-01-08 (×3): 50 ug via INTRAVENOUS
  Administered 2017-01-08: 100 ug via INTRAVENOUS
  Administered 2017-01-08 (×2): 50 ug via INTRAVENOUS

## 2017-01-08 MED ORDER — FLUTICASONE PROPIONATE 50 MCG/ACT NA SUSP
2.0000 | Freq: Every day | NASAL | Status: DC | PRN
  Filled 2017-01-08: qty 16

## 2017-01-08 MED ORDER — DEXTROSE 5 % IV SOLN
2.0000 g | Freq: Four times a day (QID) | INTRAVENOUS | Status: AC
  Administered 2017-01-08 – 2017-01-09 (×3): 2 g via INTRAVENOUS
  Filled 2017-01-08 (×3): qty 2000

## 2017-01-08 MED ORDER — ROCURONIUM BROMIDE 50 MG/5ML IV SOLN
INTRAVENOUS | Status: AC
  Filled 2017-01-08: qty 1

## 2017-01-08 MED ORDER — PHENYLEPHRINE HCL 10 MG/ML IJ SOLN
INTRAMUSCULAR | Status: DC | PRN
  Administered 2017-01-08 (×3): 100 ug via INTRAVENOUS
  Administered 2017-01-08: 200 ug via INTRAVENOUS

## 2017-01-08 MED ORDER — ASPIRIN EC 325 MG PO TBEC
325.0000 mg | DELAYED_RELEASE_TABLET | Freq: Every day | ORAL | Status: DC
  Administered 2017-01-09: 325 mg via ORAL
  Filled 2017-01-08: qty 1

## 2017-01-08 MED ORDER — BUPIVACAINE LIPOSOME 1.3 % IJ SUSP
INTRAMUSCULAR | Status: AC
  Filled 2017-01-08: qty 20

## 2017-01-08 MED ORDER — CEFAZOLIN SODIUM-DEXTROSE 2-4 GM/100ML-% IV SOLN: 2.0000 g | Freq: Four times a day (QID) | INTRAVENOUS | Status: DC

## 2017-01-08 MED ORDER — OXYCODONE HCL 5 MG PO TABS
10.0000 mg | ORAL_TABLET | ORAL | Status: DC | PRN
  Administered 2017-01-09 (×2): 10 mg via ORAL
  Filled 2017-01-08 (×2): qty 2

## 2017-01-08 MED ORDER — ROCURONIUM BROMIDE 100 MG/10ML IV SOLN
INTRAVENOUS | Status: DC | PRN
  Administered 2017-01-08: 5 mg via INTRAVENOUS
  Administered 2017-01-08 (×3): 10 mg via INTRAVENOUS
  Administered 2017-01-08: 20 mg via INTRAVENOUS
  Administered 2017-01-08: 45 mg via INTRAVENOUS

## 2017-01-08 MED ORDER — MOMETASONE FURO-FORMOTEROL FUM 200-5 MCG/ACT IN AERO
2.0000 | INHALATION_SPRAY | Freq: Two times a day (BID) | RESPIRATORY_TRACT | Status: DC
  Administered 2017-01-08 – 2017-01-09 (×2): 2 via RESPIRATORY_TRACT
  Filled 2017-01-08: qty 8.8

## 2017-01-08 MED ORDER — SODIUM CHLORIDE 0.9 % IV SOLN
INTRAVENOUS | Status: DC | PRN
  Administered 2017-01-08: 25 ug/min via INTRAVENOUS

## 2017-01-08 MED ORDER — NYSTATIN 100000 UNIT/ML MT SUSP: 5.0000 mL | Freq: Four times a day (QID) | OROMUCOSAL | Status: DC | PRN

## 2017-01-08 MED ORDER — FAMOTIDINE 20 MG PO TABS
ORAL_TABLET | ORAL | Status: AC
  Administered 2017-01-08: 20 mg via ORAL
  Filled 2017-01-08: qty 1

## 2017-01-08 MED ORDER — SUGAMMADEX SODIUM 200 MG/2ML IV SOLN
INTRAVENOUS | Status: AC
  Filled 2017-01-08: qty 2

## 2017-01-08 MED ORDER — PRAMIPEXOLE DIHYDROCHLORIDE 0.25 MG PO TABS
0.5000 mg | ORAL_TABLET | Freq: Every day | ORAL | Status: DC
  Administered 2017-01-08: 0.5 mg via ORAL
  Filled 2017-01-08: qty 2

## 2017-01-08 MED ORDER — OXYCODONE HCL 5 MG PO TABS
15.0000 mg | ORAL_TABLET | Freq: Every day | ORAL | Status: DC
  Administered 2017-01-08 – 2017-01-09 (×6): 15 mg via ORAL
  Filled 2017-01-08 (×7): qty 3

## 2017-01-08 MED ORDER — BUPIVACAINE-EPINEPHRINE (PF) 0.25% -1:200000 IJ SOLN
INTRAMUSCULAR | Status: AC
  Filled 2017-01-08: qty 10

## 2017-01-08 MED ORDER — PROMETHAZINE HCL 25 MG PO TABS
25.0000 mg | ORAL_TABLET | Freq: Four times a day (QID) | ORAL | Status: DC | PRN
  Filled 2017-01-08: qty 1

## 2017-01-08 MED ORDER — SODIUM CHLORIDE 0.9 % IV SOLN
INTRAVENOUS | Status: DC
  Administered 2017-01-08: 25 mL/h via INTRAVENOUS

## 2017-01-08 MED ORDER — MENTHOL 3 MG MT LOZG
1.0000 | LOZENGE | OROMUCOSAL | Status: DC | PRN
  Filled 2017-01-08: qty 9

## 2017-01-08 MED ORDER — OXYCODONE HCL 5 MG/5ML PO SOLN: 5.0000 mg | Freq: Once | ORAL | Status: AC | PRN

## 2017-01-08 MED ORDER — PROPOFOL 10 MG/ML IV BOLUS
INTRAVENOUS | Status: AC
  Filled 2017-01-08: qty 20

## 2017-01-08 MED ORDER — ACETAMINOPHEN 650 MG RE SUPP: 650.0000 mg | RECTAL | Status: DC | PRN

## 2017-01-08 MED ORDER — SENNOSIDES-DOCUSATE SODIUM 8.6-50 MG PO TABS: 1.0000 | ORAL_TABLET | Freq: Every evening | ORAL | Status: DC | PRN

## 2017-01-08 MED ORDER — BUPIVACAINE-EPINEPHRINE (PF) 0.25% -1:200000 IJ SOLN
INTRAMUSCULAR | Status: AC
  Filled 2017-01-08: qty 20

## 2017-01-08 MED ORDER — ACETAMINOPHEN 10 MG/ML IV SOLN
INTRAVENOUS | Status: AC
  Filled 2017-01-08: qty 100

## 2017-01-08 MED ORDER — METHOCARBAMOL 500 MG PO TABS
500.0000 mg | ORAL_TABLET | Freq: Four times a day (QID) | ORAL | Status: DC | PRN
  Administered 2017-01-08 – 2017-01-09 (×2): 500 mg via ORAL
  Filled 2017-01-08 (×2): qty 1

## 2017-01-08 MED ORDER — VITAMIN D (ERGOCALCIFEROL) 1.25 MG (50000 UNIT) PO CAPS: 50000.0000 [IU] | ORAL_CAPSULE | ORAL | Status: DC

## 2017-01-08 MED ORDER — ONDANSETRON HCL 4 MG/2ML IJ SOLN
INTRAMUSCULAR | Status: AC
  Filled 2017-01-08: qty 2

## 2017-01-08 MED ORDER — BISACODYL 10 MG RE SUPP: 10.0000 mg | Freq: Every day | RECTAL | Status: DC | PRN

## 2017-01-08 MED ORDER — PANTOPRAZOLE SODIUM 40 MG PO TBEC
40.0000 mg | DELAYED_RELEASE_TABLET | Freq: Every day | ORAL | Status: DC
  Administered 2017-01-09: 40 mg via ORAL
  Filled 2017-01-08: qty 1

## 2017-01-08 MED ORDER — DIPHENHYDRAMINE HCL 12.5 MG/5ML PO ELIX
12.5000 mg | ORAL_SOLUTION | ORAL | Status: DC | PRN
  Administered 2017-01-09: 25 mg via ORAL
  Filled 2017-01-08: qty 10

## 2017-01-08 MED ORDER — ONDANSETRON HCL 4 MG PO TABS: 4.0000 mg | ORAL_TABLET | Freq: Four times a day (QID) | ORAL | Status: DC | PRN

## 2017-01-08 SURGICAL SUPPLY — 81 items
BASEPLATE P2 COATD GLND 6.5X30 (Shoulder) ×2 IMPLANT
BIT DRILL GUIDE PATIENT MATCH (MISCELLANEOUS) ×2 IMPLANT
BLADE SAGITTAL WIDE XTHICK NO (BLADE) ×4 IMPLANT
BNDG COHESIVE 4X5 TAN STRL (GAUZE/BANDAGES/DRESSINGS) ×4 IMPLANT
BOWL CEMENT MIX W/ADAPTER (MISCELLANEOUS) IMPLANT
CANISTER SUCT 1200ML W/VALVE (MISCELLANEOUS) ×4 IMPLANT
CANISTER SUCT 3000ML PPV (MISCELLANEOUS) ×8 IMPLANT
CHLORAPREP W/TINT 26ML (MISCELLANEOUS) ×4 IMPLANT
CLOSURE WOUND 1/2 X4 (GAUZE/BANDAGES/DRESSINGS) ×1
COOLER POLAR GLACIER W/PUMP (MISCELLANEOUS) ×4 IMPLANT
DRAPE IMP U-DRAPE 54X76 (DRAPES) ×8 IMPLANT
DRAPE INCISE IOBAN 66X45 STRL (DRAPES) ×8 IMPLANT
DRAPE SHEET LG 3/4 BI-LAMINATE (DRAPES) ×12 IMPLANT
DRAPE TABLE BACK 80X90 (DRAPES) ×4 IMPLANT
DRAPE U-SHAPE 47X51 STRL (DRAPES) ×8 IMPLANT
DRILL GUIDE PATIENT MATCH (MISCELLANEOUS) ×4
DRSG OPSITE POSTOP 4X8 (GAUZE/BANDAGES/DRESSINGS) ×4 IMPLANT
DRSG TEGADERM 2-3/8X2-3/4 SM (GAUZE/BANDAGES/DRESSINGS) ×4 IMPLANT
ELECT BLADE 6.5 EXT (BLADE) ×3 IMPLANT
ELECT CAUTERY BLADE 6.4 (BLADE) ×4 IMPLANT
ELECT REM PT RETURN 9FT ADLT (ELECTROSURGICAL) ×4
ELECTRODE REM PT RTRN 9FT ADLT (ELECTROSURGICAL) ×2 IMPLANT
EVACUATOR 1/8 PVC DRAIN (DRAIN) IMPLANT
EZ CLEAN BOVIE TIP ×4 IMPLANT
GAUZE PACK 2X3YD (MISCELLANEOUS) IMPLANT
GAUZE PETRO XEROFOAM 1X8 (MISCELLANEOUS) ×4 IMPLANT
GAUZE SPONGE NON-WVN 2X2 STRL (MISCELLANEOUS) ×2 IMPLANT
GLOVE BIOGEL PI IND STRL 8 (GLOVE) ×2 IMPLANT
GLOVE BIOGEL PI INDICATOR 8 (GLOVE) ×2
GLOVE SURG ORTHO 8.0 STRL STRW (GLOVE) ×8 IMPLANT
GOWN STRL REUS W/ TWL LRG LVL3 (GOWN DISPOSABLE) ×2 IMPLANT
GOWN STRL REUS W/ TWL XL LVL3 (GOWN DISPOSABLE) ×2 IMPLANT
GOWN STRL REUS W/TWL LRG LVL3 (GOWN DISPOSABLE) ×2
GOWN STRL REUS W/TWL XL LVL3 (GOWN DISPOSABLE) ×2
HEMOSTAT SURGICEL 2X3 (HEMOSTASIS) IMPLANT
HOOD PEEL AWAY FLYTE STAYCOOL (MISCELLANEOUS) ×12 IMPLANT
INSERT SMALL SOCKET 32MM (Insert) ×4 IMPLANT
IV NS 100ML SINGLE PACK (IV SOLUTION) IMPLANT
KIT STABILIZATION SHOULDER (MISCELLANEOUS) ×4 IMPLANT
MASK FACE SPIDER DISP (MASK) ×4 IMPLANT
MAT BLUE FLOOR 46X72 FLO (MISCELLANEOUS) ×4 IMPLANT
NDL SAFETY ECLIPSE 18X1.5 (NEEDLE) ×4 IMPLANT
NEEDLE 18GX1X1/2 (RX/OR ONLY) (NEEDLE) ×4 IMPLANT
NEEDLE HYPO 18GX1.5 SHARP (NEEDLE) ×4
NEEDLE REVERSE CUT 1/2 CRC (NEEDLE) ×4 IMPLANT
NEEDLE SPNL 20GX3.5 QUINCKE YW (NEEDLE) ×4 IMPLANT
NS IRRIG 500ML POUR BTL (IV SOLUTION) ×4 IMPLANT
P2 COATDE GLNOID BSEPLT 6.5X30 (Shoulder) ×4 IMPLANT
PACK ARTHROSCOPY SHOULDER (MISCELLANEOUS) ×4 IMPLANT
PAD WRAPON POLAR SHDR UNIV (MISCELLANEOUS) ×2 IMPLANT
PULSAVAC PLUS IRRIG FAN TIP (DISPOSABLE) ×4
RETRIEVER SUT HEWSON (MISCELLANEOUS) IMPLANT
SCREW BONE LOCKING RSP 5.0X14 (Screw) ×4 IMPLANT
SCREW BONE RSP LOCK 5X14 (Screw) ×2 IMPLANT
SCREW BONE RSP LOCK 5X26 (Screw) ×4 IMPLANT
SCREW BONE RSP LOCKING 5.0X26 (Screw) ×8 IMPLANT
SCREW RETAIN W/HEAD 4MM OFFSET (Shoulder) ×4 IMPLANT
SLING ULTRA II LG (MISCELLANEOUS) IMPLANT
SLING ULTRA II M (MISCELLANEOUS) IMPLANT
SOL .9 NS 3000ML IRR  AL (IV SOLUTION) ×2
SOL .9 NS 3000ML IRR UROMATIC (IV SOLUTION) ×2 IMPLANT
SPONGE LAP 18X18 5 PK (GAUZE/BANDAGES/DRESSINGS) ×4 IMPLANT
SPONGE VERSALON 2X2 STRL (MISCELLANEOUS) ×2
STAPLER SKIN PROX 35W (STAPLE) ×4 IMPLANT
STEM HUMERAL REVERSE S 12X108 (Stem) ×4 IMPLANT
STRAP SAFETY BODY (MISCELLANEOUS) ×8 IMPLANT
STRIP CLOSURE SKIN 1/2X4 (GAUZE/BANDAGES/DRESSINGS) ×3 IMPLANT
SUT FIBERWIRE #2 38 BLUE 1/2 (SUTURE) ×16
SUT MNCRL AB 4-0 PS2 18 (SUTURE) ×4 IMPLANT
SUT PROLENE 6 0 P 1 18 (SUTURE) ×4 IMPLANT
SUT TICRON 2-0 30IN 311381 (SUTURE) ×12 IMPLANT
SUT VIC AB 0 CT1 36 (SUTURE) ×4 IMPLANT
SUT VIC AB 2-0 CT2 27 (SUTURE) ×8 IMPLANT
SUTURE FIBERWR #2 38 BLUE 1/2 (SUTURE) ×8 IMPLANT
SYR 20CC LL (SYRINGE) ×4 IMPLANT
SYR 30ML LL (SYRINGE) ×4 IMPLANT
SYR 5ML LL (SYRINGE) ×4 IMPLANT
SYRINGE IRR TOOMEY STRL 70CC (SYRINGE) IMPLANT
TIP FAN IRRIG PULSAVAC PLUS (DISPOSABLE) ×2 IMPLANT
TRAY FOLEY CATH SILVER 16FR LF (SET/KITS/TRAYS/PACK) ×4 IMPLANT
WRAPON POLAR PAD SHDR UNIV (MISCELLANEOUS) ×4

## 2017-01-08 NOTE — Anesthesia Preprocedure Evaluation (Addendum)
Anesthesia Evaluation  Patient identified by MRN, date of birth, ID band Patient awake    Reviewed: Allergy & Precautions, NPO status , Patient's Chart, lab work & pertinent test results  History of Anesthesia Complications Negative for: history of anesthetic complications  Airway Mallampati: III  TM Distance: >3 FB Neck ROM: Full    Dental  (+) Poor Dentition, Upper Dentures   Pulmonary sleep apnea , COPD,  oxygen dependent, Current Smoker,    breath sounds clear to auscultation- rhonchi (-) wheezing      Cardiovascular hypertension, Pt. on medications (-) CAD, (-) Past MI and (-) Cardiac Stents  Rhythm:Regular Rate:Normal - Systolic murmurs and - Diastolic murmurs    Neuro/Psych negative neurological ROS  negative psych ROS   GI/Hepatic Neg liver ROS, GERD  ,  Endo/Other  diabetes (diet controlled)  Renal/GU Renal InsufficiencyRenal disease     Musculoskeletal  (+) Arthritis ,   Abdominal (+) + obese,   Peds  Hematology negative hematology ROS (+)   Anesthesia Other Findings Past Medical History: No date: Arthritis     Comment:  all over No date: Back pain No date: COPD (chronic obstructive pulmonary disease) (HCC)     Comment:  O2 use continuosly at 2 liters/ some  asthma symptoms No date: Diabetes mellitus without complication (HCC)     Comment:  diet controlled No date: GERD (gastroesophageal reflux disease) No date: Heart murmur     Comment:  lifelong No date: Hypercholesterolemia No date: Hypertension     Comment:  controlled on meds 2015: Pneumonia     Comment:  [facility] hospitalized No date: Renal insufficiency     Comment:  early stage kidney failure No date: Shortness of breath dyspnea No date: Sleep apnea     Comment:  2nd sleep study said pt does not have sleep apnea No date: Swelling     Comment:  of feet and legs   Reproductive/Obstetrics                              Anesthesia Physical Anesthesia Plan  ASA: IV  Anesthesia Plan: General   Post-op Pain Management:    Induction: Intravenous  PONV Risk Score and Plan: 1 and Ondansetron and Dexamethasone  Airway Management Planned: Oral ETT  Additional Equipment: Arterial line  Intra-op Plan:   Post-operative Plan: Extubation in OR and Possible Post-op intubation/ventilation  Informed Consent: I have reviewed the patients History and Physical, chart, labs and discussed the procedure including the risks, benefits and alternatives for the proposed anesthesia with the patient or authorized representative who has indicated his/her understanding and acceptance.   Dental advisory given  Plan Discussed with: CRNA and Anesthesiologist  Anesthesia Plan Comments: (Discussed possible need for intubation/ventilation given hx of severe COPD and need for home O2)      Anesthesia Quick Evaluation

## 2017-01-08 NOTE — Anesthesia Postprocedure Evaluation (Signed)
Anesthesia Post Note  Patient: Brenda Glenn  Procedure(s) Performed: REVERSE SHOULDER ARTHROPLASTY (Right Shoulder)  Patient location during evaluation: PACU Anesthesia Type: General Level of consciousness: awake and alert and oriented Pain management: pain level controlled Vital Signs Assessment: post-procedure vital signs reviewed and stable Respiratory status: spontaneous breathing, nonlabored ventilation and respiratory function stable Cardiovascular status: blood pressure returned to baseline and stable Postop Assessment: no signs of nausea or vomiting Anesthetic complications: no     Last Vitals:  Vitals:   01/08/17 1238 01/08/17 1250  BP: (!) 141/57 110/73  Pulse: (!) 117 (!) 104  Resp: 11 16  Temp:    SpO2: 99% 99%    Last Pain:  Vitals:   01/08/17 1315  TempSrc:   PainSc: 6                  Mikeria Valin

## 2017-01-08 NOTE — Anesthesia Procedure Notes (Signed)
Procedure Name: Intubation Performed by: Lance Muss, CRNA Pre-anesthesia Checklist: Patient identified, Patient being monitored, Timeout performed, Emergency Drugs available and Suction available Patient Re-evaluated:Patient Re-evaluated prior to induction Oxygen Delivery Method: Circle system utilized Preoxygenation: Pre-oxygenation with 100% oxygen Induction Type: IV induction Ventilation: Mask ventilation without difficulty Laryngoscope Size: Mac and 3 Grade View: Grade I Tube type: Oral Tube size: 7.0 mm Number of attempts: 1 Airway Equipment and Method: Stylet and LTA kit utilized Placement Confirmation: ETT inserted through vocal cords under direct vision,  positive ETCO2 and breath sounds checked- equal and bilateral Secured at: 22 cm Tube secured with: Tape Dental Injury: Teeth and Oropharynx as per pre-operative assessment

## 2017-01-08 NOTE — Care Management Note (Signed)
Case Management Note  Patient Details  Name: Brenda Glenn MRN: [id number] Date of Birth: 18-Oct-1940  Subjective/Objective:                  Met with patient and her family to discuss discharge planning. She lives with her husband and walks with a cane at baseline. Brenda Glenn is at bedside. She is on chronic O2 at home through [employer]. She has used [facility] for home health in the past and would like to use them again.   Action/Plan: Referral sent to [facility].    Expected Discharge Date:  01/09/17               Expected Discharge Plan:     In-House Referral:     Discharge planning Services  CM Consult  Post Acute Care Choice:    Choice offered to:  Patient, Adult Children  DME Arranged:    DME Agency:     HH Arranged:  PT, OT [place] Agency:  [facility] (formerly [employer])  Status of Service:  In process, will continue to follow  If discussed at Long Length of Stay Meetings, dates discussed:    Additional Comments:  Marshell Garfinkel, RN 01/08/2017, 3:58 PM

## 2017-01-08 NOTE — Transfer of Care (Signed)
Immediate Anesthesia Transfer of Care Note  Patient: Brenda Glenn  Procedure(s) Performed: REVERSE SHOULDER ARTHROPLASTY (Right Shoulder)  Patient Location: PACU  Anesthesia Type:General  Level of Consciousness: awake and responds to stimulation  Airway & Oxygen Therapy: Patient Spontanous Breathing and Patient connected to face mask oxygen  Post-op Assessment: Report given to RN and Post -op Vital signs reviewed and stable  Post vital signs: Reviewed and stable  Last Vitals:  Vitals:   01/08/17 1235 01/08/17 1238  BP: 121/87 (!) 141/57  Pulse: (!) 120 (!) 117  Resp: (!) 21 11  Temp: 36.7 C   SpO2: 98% 99%    Last Pain:  Vitals:   01/08/17 0650  TempSrc: Oral  PainSc: 6          Complications: No apparent anesthesia complications

## 2017-01-08 NOTE — H&P (Signed)
Paper H&P to be scanned into permanent record. H&P reviewed. No significant changes noted.  

## 2017-01-08 NOTE — Evaluation (Signed)
Physical Therapy Evaluation Patient Details Name: Brenda Glenn MRN: [id number] DOB: [birth year] Today's Date: 01/08/2017   History of Present Illness  76 y/o female s/p R total shoulder replacement 11/9.  Clinical Impression  Pt did relatively well with POD0 PT exam.  She c/o considerable pain and needed some convincing to participate, but once she agreed she did well and showed good effort.  Pt was able to do some minimal in room walking, discussed safety with this as well as getting in/out of bed and possible strategies to make this easier at home.  She did have fatigue with minimal ambulation (she does use O2 at home) but was relatively safe with the effort using her cane.  Pt will need to do steps tomorrow and is somewhat hesitant with this.  PT recommending HHPT given her functional limitations and h/o balance issues.    Follow Up Recommendations DC plan and follow up therapy as arranged by surgeon;Home health PT    Equipment Recommendations  (possibly BSC)    Recommendations for Other Services       Precautions / Restrictions Precautions Precautions: Shoulder Type of Shoulder Precautions: total Shoulder Interventions: Shoulder sling/immobilizer Precaution Booklet Issued: No Restrictions Weight Bearing Restrictions: Yes RUE Weight Bearing: Non weight bearing      Mobility  Bed Mobility Overal bed mobility: Needs Assistance Bed Mobility: Supine to Sit     Supine to sit: Min assist     General bed mobility comments: Pt showed good effort in getting to sitting and was able to initiate movement, ultimately she needed a hand to pull up from and light assist to lift torso  Transfers Overall transfer level: Modified independent Equipment used: Straight cane             General transfer comment: Pt was able to rise to standing w/o direct assist, c/o some lightheadedness but no LOBs  Ambulation/Gait Ambulation/Gait assistance: Min guard Ambulation Distance  (Feet): 30 Feet Assistive device: Straight cane       General Gait Details: Pt with slow but relatively confident ambulation.  [facility], but needed it.  She had some fatigue and lightheadedness, did not feel confident going out of the room  Stairs            Wheelchair Mobility    Modified Rankin (Stroke Patients Only)       Balance Overall balance assessment: Modified Independent                                           Pertinent Vitals/Pain Pain Assessment: 0-10 Pain Score: 6  Pain Location: R shoulder    Home Living Family/patient expects to be discharged to:: Private residence Living Arrangements: Spouse/significant other Available Help at Discharge: Family(daughter will be able to help the first few days)   Home Access: Stairs to enter Entrance Stairs-Rails: Left;Right Entrance Stairs-Number of Steps: 3   Home Equipment: Cane - single point      Prior Function Level of Independence: Independent with assistive device(s)         Comments: Pt reports she does not always use cane in the home, generally able to be active but has a lot of chronic pain that limits her     Hand Dominance        Extremity/Trunk Assessment   Upper Extremity Assessment Upper Extremity Assessment: (R UE not  tested, L WFL grossly 4-/5)    Lower Extremity Assessment Lower Extremity Assessment: Generalized weakness;Overall WFL for tasks assessed       Communication   Communication: No difficulties  Cognition Arousal/Alertness: Awake/alert Behavior During Therapy: WFL for tasks assessed/performed Overall Cognitive Status: Within Functional Limits for tasks assessed                                        General Comments      Exercises     Assessment/Plan    PT Assessment Patient needs continued PT services  PT Problem List Decreased strength;Decreased activity tolerance;Decreased range of motion;Decreased  balance;Decreased mobility;Decreased cognition;Decreased knowledge of use of DME;Decreased safety awareness;Pain       PT Treatment Interventions DME instruction;Gait training;Stair training;Functional mobility training;Therapeutic activities;Therapeutic exercise;Balance training;Neuromuscular re-education;Patient/family education    PT Goals (Current goals can be found in the Care Plan section)  Acute Rehab PT Goals Patient Stated Goal: have HHPT before going to outpatient PT Goal Formulation: With patient Time For Goal Achievement: 01/22/17 Potential to Achieve Goals: Fair    Frequency BID   Barriers to discharge        Co-evaluation               AM-PAC PT "6 Clicks" Daily Activity  Outcome Measure Difficulty turning over in bed (including adjusting bedclothes, sheets and blankets)?: Unable Difficulty moving from lying on back to sitting on the side of the bed? : Unable Difficulty sitting down on and standing up from a chair with arms (e.g., wheelchair, bedside commode, etc,.)?: None Help needed moving to and from a bed to chair (including a wheelchair)?: A Little Help needed walking in hospital room?: A Little Help needed climbing 3-5 steps with a railing? : A Lot 6 Click Score: 14    End of Session Equipment Utilized During Treatment: Gait belt;Oxygen(2L, sats in low 90s (80s with ambulation, quickly recovered)) Activity Tolerance: Patient limited by fatigue;Patient limited by pain Patient left: with chair alarm set;with call bell/phone within reach;with family/visitor present Nurse Communication: Mobility status PT Visit Diagnosis: Muscle weakness (generalized) (M62.81);Difficulty in walking, not elsewhere classified (R26.2)    Time: [id number] PT Time Calculation (min) (ACUTE ONLY): 27 min   Charges:   PT Evaluation $PT Eval Low Complexity: 1 Low PT Treatments $Therapeutic Activity: 8-22 mins   PT G Codes:   PT G-Codes **NOT FOR INPATIENT  CLASS** Functional Assessment Tool Used: AM-PAC 6 Clicks Basic Mobility Functional Limitation: Mobility: Walking and moving around Mobility: Walking and Moving Around Current Status (W1027(G8978): At least 40 percent but less than 60 percent impaired, limited or restricted Mobility: Walking and Moving Around Goal Status [id number](G8979): At least 1 percent but less than 20 percent impaired, limited or restricted    Malachi ProGalen R Joncarlo Friberg, DPT 01/08/2017, 6:08 PM

## 2017-01-08 NOTE — Anesthesia Post-op Follow-up Note (Signed)
Anesthesia QCDR form completed.        

## 2017-01-08 NOTE — Op Note (Signed)
Operative Note   SURGERY DATE: 01/08/2017  PRE-OP DIAGNOSIS:  1. Right shoulder rotator cuff arthropathy  POST-OP DIAGNOSIS: 1. Right shoulder rotator cuff arthropathy 2. Conjoint tendon ganglion cyst   PROCEDURES:  1. Right reverse total shoulder arthroplasty 2. Decompression of conjoint tendon ganglion cyst  SURGEON: Cato Mulligan, MD  ASSISTANTS: Reche Dixon, PA  ANESTHESIA: Gen + interscalene block  ESTIMATED BLOOD LOSS:150cc  TOTAL IV FLUIDS: 1295m  IMPLANTS: [employer] Surgical: RSP Glenoid Head w/Retaining screw 32-4; Monoblock Reverse Shoulder Baseplate with 60.9NAcentral screw; 3 locking screws into baseplate (135TD 232KG 225KY; Small Shell Humeral Stem 12 x1016m +4 Small Socket Insert;   INDICATION(S):  Brenda Glenn a [age].o. female with chronic shoulder pain.  She is currently unable to lift her arm over her head. MRI shows massive rotator cuff tear and likely subscapularis tear. Conservative measures including medications and cortisone injections have not provided adequate relief. After discussion of risks, benefits, and alternatives to surgery, the patient elected to proceed with reverse shoulder arthroplasty.  OPERATIVE FINDINGS: massive rotator cuff tear (complete supraspinatus, partial infraspinatus, complete subscapularis), prior proximal biceps rupture, ganglion cyst on anterior aspect of conjoint tendon  OPERATIVE REPORT:  I identified Brenda Glenn the pre-operative holding area. Informed consent was obtained and the surgical site was marked. I reviewed the risks and benefits of the proposed surgical intervention and the patient (and/or patient's guardian) wished to proceed. An interscalene block was not administered by the Anesthesia team due to patient's history of COPD. The patient was transferred to the operative suite and general anesthesia was administered. The patient was placed in the beach chair position with the head of the bed  elevated approximately 45 degrees. All down side pressure points were appropriately padded. Pre-op exam under anesthesia confirmed some stiffness and crepitus. Appropriate IV antibioticswere administered within 30 minutes before incision. Tranexamic acid was also administered after verifying that the patient had no contraindications. The extremity was then prepped and draped in standard fashion. A time out was performed confirming the correct extremity, correct patient, and correct procedure.   We used the standard deltopectoral incision from the coracoid to ~12cm distal. We found the cephalic vein and took it laterally. We opened the deltopectoral interval widely and placed retractors under the CA ligament in the subacromial space and under the deltoid tendon at its insertion. We then abducted and internally rotated the arm and released the underlying bursa between these retractors, taking care not to damage the circumflex branch of the axillary nerve.   Next, we brought the arm back in adduction at slight forward flexion with external rotation. There was an ~1x1cm ganglion cyst on the anterior aspect of the conjoint tendon just distal to the coracoid process. This was decompressed and excised sharply with a 15 blade. We opened the clavipectoral fascia lateral to the conjoint tendon. We gently palpated the axillary nerve and verified its position and continuity on both sides of the humerus with a Tug test. Note, this test was repeated multiple times during the procedure for nerve localization and confirmed to be intact at the end of the case. We then cauterized the anterior humeral circumflex ("Three sisters") vessels. The arm was then internally rotated, we cut the falciform ligament at approximately 1 cm of the upper portion of the pectoralis major insertion. Next we unroofed the bicipital groove. The biceps tendon could not be found.   At this point, we could see that the supraspinatus was completely torn  with  a bald humeral head superiorly. The subscapularis also completely torn and retracted medially. We performed an anterior capsule/subscapularis peel using electrocautery to remove the anterior capsule and subscapularis off of the humeral head. We released the inferior capsule from the humerus all the way to the posterior band of the inferior glenohumeral ligament. When this was complete we gently dislocated the shoulder up into the wound. We removed any osteophytes and made our cut with the appropriate inclination in 30 degrees of retroversion.  We then turned our attention back to the glenoid. A Fukuda retractor was used to retract the proximal humerus posteriorly. The anterior capsule was dissected free from the subscapularis. The anterior capsule was then excised, exposing the anterior glenoid. We then grasped the labrum and removed it circumferentially. During the glenoid exposure, the axillary nerve was protected the entire time.    A patient-specific guide was used to drill the central guidepin. A tap was placed, matching the trajectory of the guidepin. An appropriately sized reamer was used to ream the glenoid. The monoblock baseplate was inserted and excellent fixation was achieved such that the entire scapula rotated with further attempted seating of the baseplate. The 4 peripheral screws were drilled, but the anterior hole had essentially no purchase. Therefore, the other 3 holes were measured, and screws were placed. A 32-4 glenosphere was then placed and tightened. ~15 cc of a mixture of 2:3 Exparil to Sensorcaine was placed about the glenoid and capsule avoiding injecting in the area of the axillary nerve.   We then turned our attention back to the humerus. We sized for a small-shell prosthesis. We sequentially used larger diameter canal finders until we met significant resistance at a size 12. We sequentially broached to a size 12. We trialed both a 0 and +4 poly. The +4 poly had better fit.  The humerus was trialed and noted to have satisfactory stability, motion, and deltoid tension. We decided to proceed with a +4 poly. The final humeral component was then inserted. Bone graft from the cut humeral head was placed between the implant and the greater tuberosity. We placed a  +4 poly. The humerus was reduced and final confirmation of motion, tension, and stability were satisfactory. We then identified a punctate area of bleeding from the cephalic vein. Two 6-0 Prolene stitches were used to repair the vein. A Hemovac drain was placed. We chose not to repair the subscapularis due to the amount of retraction and subsequent tension that would be on a potential repair.  ~25cc of a mixture of 2:3 Exparil to Sensorcaine was placed about the deltopectoral interval and subcutaneous tissue.  The wound was thoroughly irrigated. We closed the deltopectoral interval deep to the cephalic vein with a running, 0-Vicryl suture. The skin was closed with 2-0 Vicryl and staples. Xeroform and Honeycomb dressing was applied. A PolarCare unit and sling were placed. Patient was extubated, transferred to a stretcher bed and to the post antesthesia care unit in stable condition.   POSTOPERATIVE PLAN: The patient will be admitted with plan for discharge home on POD#1. Operative arm to remain in sling at all times except RoM exercises and hygiene. Can perform pendulums, elbow/wrist/hand RoM exercises. Passive RoM allowed to 90 FF and 30 ER. ASA 358m x 6 weeks for DVT ppx. Plan for outpatient PT starting on POD #3-4. Patient to return to clinic in ~2 weeks for post-operative appointment.

## 2017-01-08 NOTE — Anesthesia Procedure Notes (Signed)
Arterial Line Insertion Start/End8/06/2016 8:10 AM, 01/08/2017 8:10 AM Performed by: Alver FisherPenwarden, Amy, MD, Casey BurkittHoang, Elyanah Farino, CRNA, CRNA  Patient location: OR. Preanesthetic checklist: patient identified, IV checked, site marked, risks and benefits discussed, surgical consent, monitors and equipment checked, pre-op evaluation, timeout performed and anesthesia consent Lidocaine 1% used for infiltration Left, radial was placed Catheter size: 20 Fr Hand hygiene performed , maximum sterile barriers used  and Seldinger technique used Allen's test indicative of satisfactory collateral circulation Attempts: 1 Procedure performed without using ultrasound guided technique. Following insertion, dressing applied. Post procedure assessment: normal and unchanged  Patient tolerated the procedure well with no immediate complications.

## 2017-01-09 LAB — BASIC METABOLIC PANEL
ANION GAP: 7 (ref 5–15)
BUN: 21 mg/dL — ABNORMAL HIGH (ref 6–20)
CALCIUM: 8.6 mg/dL — AB (ref 8.9–10.3)
CHLORIDE: 102 mmol/L (ref 101–111)
CO2: 28 mmol/L (ref 22–32)
CREATININE: 0.95 mg/dL (ref 0.44–1.00)
GFR calc non Af Amer: 57 mL/min — ABNORMAL LOW (ref >60)
Glucose, Bld: 129 mg/dL — ABNORMAL HIGH (ref 65–99)
Potassium: 4.6 mmol/L (ref 3.5–5.1)
SODIUM: 137 mmol/L (ref 135–145)

## 2017-01-09 LAB — CBC
HEMATOCRIT: 40.2 % (ref 35.0–47.0)
HEMOGLOBIN: 13.2 g/dL (ref 12.0–16.0)
MCH: 33.3 pg (ref 26.0–34.0)
MCHC: 32.7 g/dL (ref 32.0–36.0)
MCV: 101.7 fL — ABNORMAL HIGH (ref 80.0–100.0)
Platelets: 210 10*3/uL (ref 150–440)
RBC: 3.95 MIL/uL (ref 3.80–5.20)
RDW: 18.6 % — AB (ref 11.5–14.5)
WBC: 12.5 10*3/uL — AB (ref 3.6–11.0)

## 2017-01-09 LAB — GLUCOSE, CAPILLARY
GLUCOSE-CAPILLARY: 134 mg/dL — AB (ref 65–99)
Glucose-Capillary: 114 mg/dL — ABNORMAL HIGH (ref 65–99)

## 2017-01-09 MED ORDER — TRAMADOL HCL 50 MG PO TABS: 50.0000 mg | ORAL_TABLET | ORAL | Status: DC | PRN

## 2017-01-09 MED ORDER — THEOPHYLLINE ER 200 MG PO CP24
400.0000 mg | ORAL_CAPSULE | Freq: Every day | ORAL | Status: DC
  Administered 2017-01-09: 400 mg via ORAL
  Filled 2017-01-09: qty 2

## 2017-01-09 MED ORDER — MUPIROCIN 2 % EX OINT
1.0000 "application " | TOPICAL_OINTMENT | Freq: Two times a day (BID) | CUTANEOUS | Status: DC
  Filled 2017-01-09: qty 22

## 2017-01-09 MED ORDER — CHLORHEXIDINE GLUCONATE CLOTH 2 % EX PADS: 6.0000 | MEDICATED_PAD | Freq: Every day | CUTANEOUS | Status: DC

## 2017-01-09 NOTE — Discharge Instructions (Signed)
REVERSE TOTAL SHOULDER REPLACEMENT DIRECTIONS  Shoulder Rehabilitation, Guidelines Following Surgery  Results after shoudler surgery are often greatly improved when you follow the exercise, range of motion and muscle strengthening exercises prescribed by your doctor. Safety measures are also important to protect the knee from further injury. Any time any of these exercises cause you to have increased pain or swelling in your knee joint, decrease the amount until you are comfortable again and slowly increase them. If you have problems or questions, call your caregiver or physical therapist for advice.   HOME CARE INSTRUCTIONS  Remove items at home which could result in a fall. This includes throw rugs or furniture in walking pathways.   Continue to use polar care unit on the shoudler for pain and swelling from surgery. Please try and keep this on 24 hrs per day. This is normal after surgery.    .    Continue to use the breathing machine which will help keep your temperature down.  It is common for your temperature to cycle up and down following surgery, especially at night when you are not up moving around and exerting yourself.  The breathing machine keeps your lungs expanded and your temperature down.  Work on range of motion of fingers and wrist. Therapist will show you how to work on range of motion of the elbow  DIET You may resume your previous home diet once your are discharged from the hospital.  DRESSING / WOUND CARE / SHOWERING You may start showering once staples have been removed. Change the surgical dressing as needed.  STAPLE REMOVAL: Staples are to be removed at two weeks post op and apply benzoin and 1/2 inch steri strips  ACTIVITY Walk with your cane as instructed. Avoid periods of inactivity such as sitting longer than an hour when not asleep. This helps prevent blood clots.  Do not drive a car for 6 weeks or until released by you surgeon.  Do not drive while taking  narcotics.  WEIGHT BEARING Weight bearing as tolerated with assist device (walker, cane, etc) as directed, use it as long as suggested by your surgeon or therapist, typically at least 4-6 weeks.  POSTOPERATIVE CONSTIPATION PROTOCOL Constipation - defined medically as fewer than three stools per week and severe constipation as less than one stool per week.  One of the most common issues patients have following surgery is constipation.  Even if you have a regular bowel pattern at home, your normal regimen is likely to be disrupted due to multiple reasons following surgery.  Combination of anesthesia, postoperative narcotics, change in appetite and fluid intake all can affect your bowels.  In order to avoid complications following surgery, here are some recommendations in order to help you during your recovery period.  Colace (docusate) - Pick up an over-the-counter form of Colace or another stool softener and take twice a day as long as you are requiring postoperative pain medications.  Take with a full glass of water daily.  If you experience loose stools or diarrhea, hold the colace until you stool forms back up.  If your symptoms do not get better within 1 week or if they get worse, check with your doctor.  Dulcolax (bisacodyl) - Pick up over-the-counter and take as directed by the product packaging as needed to assist with the movement of your bowels.  Take with a full glass of water.  Use this product as needed if not relieved by Colace only.   MiraLax (polyethylene glycol) -  Pick up over-the-counter to have on hand.  MiraLax is a solution that will increase the amount of water in your bowels to assist with bowel movements.  Take as directed and can mix with a glass of water, juice, soda, coffee, or tea.  Take if you go more than two days without a movement. Do not use MiraLax more than once per day. Call your doctor if you are still constipated or irregular after using this medication for 7 days  in a row.  If you continue to have problems with postoperative constipation, please contact the office for further assistance and recommendations.  If you experience "the worst abdominal pain ever" or develop nausea or vomiting, please contact the office immediatly for further recommendations for treatment.  ITCHING  If you experience itching with your medications, try taking only a single pain pill, or even half a pain pill at a time.  You can also use Benadryl over the counter for itching or also to help with sleep.    MEDICATIONS See your medication summary on the After Visit Summary that the nursing staff will review with you prior to discharge.  You may have some home medications which will be placed on hold until you complete the course of blood thinner medication.  It is important for you to complete the blood thinner medication as prescribed by your surgeon.  Continue your approved medications as instructed at time of discharge.  PRECAUTIONS If you experience chest pain or shortness of breath - call 911 immediately for transfer to the hospital emergency department.  If you develop a fever greater that 101 F, purulent drainage from wound, increased redness or drainage from wound, foul odor from the wound/dressing, or calf pain - CONTACT YOUR SURGEON.                                                   FOLLOW-UP APPOINTMENTS Make sure you keep all of your appointments after your operation with your surgeon and caregivers. You should call the office at the above phone number and make an appointment for approximately two weeks after the date of your surgery or on the date instructed by your surgeon outlined in the "After Visit Summary".   RANGE OF MOTION AND STRENGTHENING EXERCISES  Will be shown to you by the therapist   IF YOU ARE TRANSFERRED TO A SKILLED REHAB FACILITY If the patient is transferred to a skilled rehab facility following release from the hospital, a list of the current  medications will be sent to the facility for the patient to continue.  When discharged from the skilled rehab facility, please have the facility set up the patient's Home Health Physical Therapy prior to being released. Also, the skilled facility will be responsible for providing the patient with their medications at time of release from the facility to include their pain medication, the muscle relaxants, and their blood thinner medication. If the patient is still at the rehab facility at time of the two week follow up appointment, the skilled rehab facility will also need to assist the patient in arranging follow up appointment in our office and any transportation needs.  MAKE SURE YOU:  Understand these instructions.  Get help right away if you are not doing well or get worse.

## 2017-01-09 NOTE — Care Management Note (Addendum)
Case Management Note  Patient Details  Name: Brenda Glenn MRN: [id number] Date of Birth: Sep 01, 1940  Subjective/Objective:    Discussed Discharge planning with family. [facility] but [facility] of [name] and [facility] and [facility] [place] [facility]                 Action/Plan:   Expected Discharge Date:  01/09/17               Expected Discharge Plan:  Home w Home Health Services  In-House Referral:     Discharge planning Services  CM Consult  Post Acute Care Choice:  Home Health Choice offered to:  Patient, Adult Children  DME Arranged:  N/A DME Agency:  NA  HH Arranged:  PT, OT HH Agency:  [facility]  Status of Service:  Completed, signed off  If discussed at Long Length of Stay Meetings, dates discussed:    Additional Comments:  [name] A, RN 01/09/2017, 4:50 PM

## 2017-01-09 NOTE — Progress Notes (Signed)
Patient discharge home, family at bedside.

## 2017-01-09 NOTE — Progress Notes (Signed)
Physical Therapy Treatment Patient Details Name: Brenda Glenn MRN: [id number] DOB: April 20, 1940 Today's Date: 01/09/2017    History of Present Illness 76 y/o female s/p R total shoulder replacement 11/9.    PT Comments    Pt in recliner with family.  Agrees to session.  Pt on 2 lpm O2 92%.  Pt stated she is to wear O2 at home at all times but does not typically wear it.  She monitors O2 at home and dons as needed.  O2 removed and while doing gently seated AROM, O2 drops to 88%.  O2 reapplied for rest of session and education regarding O2 sats and use given.  Pt voiced understanding.  [name] and ambulate to/from rehab gym for stair training with one seated rest.  She went up/down steps with left rail.  She did have some increased anxiety descending stairs but overall does well with encouragement.  Family in to observe session.    OT scheduled this AM to address shoulder rom/ex.     Follow Up Recommendations  DC plan and follow up therapy as arranged by surgeon;Home health PT     Equipment Recommendations       Recommendations for Other Services       Precautions / Restrictions Precautions Precautions: Shoulder Type of Shoulder Precautions: total Shoulder Interventions: Shoulder sling/immobilizer Precaution Booklet Issued: No Restrictions Weight Bearing Restrictions: Yes RUE Weight Bearing: Non weight bearing    Mobility  Bed Mobility               General bed mobility comments: in recliner  Transfers Overall transfer level: Modified independent Equipment used: Straight cane             General transfer comment: Pt was able to rise to standing w/o direct assist  Ambulation/Gait Ambulation/Gait assistance: Min guard Ambulation Distance (Feet): 50 Feet Assistive device: Straight cane Gait Pattern/deviations: Step-through pattern;Decreased step length - right;Decreased step length - left         Stairs Stairs: Yes   Stair  Management: One rail Left Number of Stairs: 4 General stair comments: Did well up, some increased anxiety and fear of falling down but overall does well.  Daughter in to observe.  Wheelchair Mobility    Modified Rankin (Stroke Patients Only)       Balance Overall balance assessment: Modified Independent                                          Cognition Arousal/Alertness: Awake/alert Behavior During Therapy: WFL for tasks assessed/performed Overall Cognitive Status: Within Functional Limits for tasks assessed                                        Exercises Other Exercises Other Exercises: Seated AROM BLE ankle pumps, LAQ.    General Comments        Pertinent Vitals/Pain Pain Score: 9  Pain Location: R shoulder, back    Home Living                      Prior Function            PT Goals (current goals can now be found in the care plan section) Progress towards PT goals: Progressing toward goals  Frequency    BID      PT Plan Current plan remains appropriate    Co-evaluation              AM-PAC PT "6 Clicks" Daily Activity  Outcome Measure  Difficulty turning over in bed (including adjusting bedclothes, sheets and blankets)?: Unable Difficulty moving from lying on back to sitting on the side of the bed? : Unable Difficulty sitting down on and standing up from a chair with arms (e.g., wheelchair, bedside commode, etc,.)?: None Help needed moving to and from a bed to chair (including a wheelchair)?: A Little Help needed walking in hospital room?: A Little Help needed climbing 3-5 steps with a railing? : A Little 6 Click Score: 15    End of Session Equipment Utilized During Treatment: Gait belt;Oxygen Activity Tolerance: Patient limited by fatigue;Patient limited by pain Patient left: with chair alarm set;with call bell/phone within reach;with family/visitor present;in chair         Time:  [id number] PT Time Calculation (min) (ACUTE ONLY): 27 min  Charges:  $Gait Training: 8-22 mins $Therapeutic Exercise: 8-22 mins                    G Codes:       Danielle DessSarah Addam Goeller, PTA 01/09/17, 10:04 AM

## 2017-01-09 NOTE — Progress Notes (Signed)
Subjective: 1 Day Post-Op Procedure(s) (LRB): REVERSE SHOULDER ARTHROPLASTY (Right) Patient reports pain as 9 on 0-10 scale.   Patient is well, and has had no acute complaints or problems Did get up and walked to door yesterday with therapy Pt c/o having significant "aching" to right shoulder Concerned about going up steps at home 2/2 weakness of lower ext 2/2 neurological condition Plan is to go Home after hospital stay. no nausea and no vomiting Patient denies any chest pains or shortness of breath. Objective: Vital signs in last 24 hours: Temp:  [97.6 F (36.4 C)-98.3 F (36.8 C)] 97.6 F (36.4 C) (11/10 0421) Pulse Rate:  [67-120] 73 (11/10 0421) Resp:  [11-21] 18 (11/10 0421) BP: (103-141)/(49-87) 118/64 (11/10 0421) SpO2:  [94 %-99 %] 94 % (11/10 0421) Arterial Line BP: (142)/(58) 142/58 (11/09 1238) Weight:  [71.1 kg (156 lb 12.3 oz)] 71.1 kg (156 lb 12.3 oz) (11/09 1435) well approximated incision Heels are non tender and elevated off the bed using rolled towels Intake/Output from previous day: 11/09 0701 - 11/10 0700 In: 1891.3 [I.V.:1591.3; IV Piggyback:300] Out: [id number] [Urine:2675; Drains:80; Blood:300] Intake/Output this shift: No intake/output data recorded.  Recent Labs    01/08/17 0707 01/09/17 0321  HGB 13.6 13.2   Recent Labs    01/08/17 0707 01/09/17 0321  WBC  --  12.5*  RBC  --  3.95  HCT 40.0 40.2  PLT  --  210   Recent Labs    01/08/17 0707 01/09/17 0321  NA 138 137  K 4.9 4.6  CL  --  102  CO2  --  28  BUN  --  21*  CREATININE  --  0.95  GLUCOSE 132* 129*  CALCIUM  --  8.6*   No results for input(s): LABPT, INR in the last 72 hours.  EXAM General - Patient is Alert, Appropriate and Oriented Extremity - Neurologically intact Neurovascular intact Sensation intact distally Intact pulses distally Compartment soft Dressing - moderate drainage Motor Function - intact, moving fingers and wrist well on exam. Good gross motor  strength   Past Medical History:  Diagnosis Date  . Arthritis    all over  . Back pain   . COPD (chronic obstructive pulmonary disease) (HCC)    O2 use continuosly at 2 liters/ some  asthma symptoms  . Diabetes mellitus without complication (HCC)    diet controlled  . GERD (gastroesophageal reflux disease)   . Heart murmur    lifelong  . Hypercholesterolemia   . Hypertension    controlled on meds  . Pneumonia 2015   [facility]  . Renal insufficiency    early stage kidney failure  . Shortness of breath dyspnea   . Sleep apnea    2nd sleep study said pt does not have sleep apnea  . Swelling    of feet and legs    Assessment/Plan: 1 Day Post-Op Procedure(s) (LRB): REVERSE SHOULDER ARTHROPLASTY (Right) Active Problems:   Status post shoulder replacement  Estimated body mass index is 31.66 kg/m as calculated from the following:   Height as of this encounter: 4\' 11"  (1.499 m).   Weight as of this encounter: 71.1 kg (156 lb 12.3 oz). Advance diet Up with therapy Discharge home with home health  Labs: were reviewed DVT Prophylaxis - Aspirin 325mg  for 6 weeks Abduction pillow and sling to be worn at all time except when doing therapy and bathing. Do not get wound wet until staples removed in two weeks [facility]  [facility] 2 weeks Home health for evaluation of home setting  D/C O2 and Pulse OX and try on Room Air  Lonzie Simmer R. [facility] PA [facility] 01/09/2017, 7:38 AM

## 2017-01-09 NOTE — Discharge Summary (Signed)
Physician Discharge Summary  Patient ID: Brenda Glenn MRN: [id number] DOB/AGE: [birth year] 76 y.o.  Admit date: 01/08/2017 Discharge date: 01/09/2017  Admission Diagnoses:  RIGHT SHOULDER PAIN,CHRONICITY   Discharge Diagnoses: Patient Active Problem List   Diagnosis Date Noted  . 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 Lymphedema 01/06/2016  . Chronic venous insufficiency 01/06/2016  . Pain in limb 01/06/2016  . COPD (chronic obstructive pulmonary disease) (HCC) 01/06/2016  . Abnormality of gait 01/06/2016    Past Medical History:  Diagnosis Date  . Arthritis    all over  . Back pain   . COPD (chronic obstructive pulmonary disease) (HCC)    O2 use continuosly at 2 liters/ some  asthma symptoms  . Diabetes mellitus without complication (HCC)    diet controlled  . GERD (gastroesophageal reflux disease)   . Heart murmur    lifelong  . Hypercholesterolemia   . Hypertension    controlled on meds  . Pneumonia 2015   [facility]  . Renal insufficiency    early stage kidney failure  . Shortness of breath dyspnea   . Sleep apnea    2nd sleep study said pt does not have sleep apnea  . Swelling    of feet and legs     Transfusion: none   Consultants (if any):   Discharged Condition: Improved  Hospital Course: Brenda Glenn is an 76 y.o. female who was admitted 01/08/2017 with a diagnosis of right shoulder rotator cuff arthropathy and went to the operating room on 01/08/2017 and underwent the above named procedures.    Surgeries:Procedure(s): REVERSE SHOULDER ARTHROPLASTY on 01/08/2017  PRE-OP DIAGNOSIS:  1. Right shoulder rotator cuff arthropathy  POST-OP DIAGNOSIS: 1. Right shoulder rotator cuff arthropathy 2. Conjoint tendon ganglion cyst   PROCEDURES:  1. Right reverse total shoulder arthroplasty 2. Decompression of conjoint tendon ganglion cyst  SURGEON: Rosealee AlbeeSunny H. Patel, MD  ASSISTANTS: Dedra Skeensodd Mundy, PA  ANESTHESIA: Gen +  interscalene block  ESTIMATED BLOOD LOSS:150cc  TOTAL IV FLUIDS: 1200ml  IMPLANTS: [employer] Surgical: RSP Glenoid Head w/Retaining screw 32-4; Monoblock Reverse Shoulder Baseplate with 6.625mm central screw; 3 locking screws into baseplate (14mm, 26mm, 26mm); Small Shell Humeral Stem 12 x17408mm; +4 Small Socket Insert;   INDICATION(S): Brenda EkeJamie Graves Nashis a 76 y.o. female with chronic shoulder pain.  She is currently unable to lift her arm over her head. MRI shows massive rotator cuff tear and likely subscapularis tear. Conservative measures including medications and cortisone injections have not provided adequate relief. After discussion of risks, benefits, and alternatives to surgery, the patient elected to proceed with reverse shoulder arthroplasty.  OPERATIVE FINDINGS: massive rotator cuff tear (complete supraspinatus, partial infraspinatus, complete subscapularis), prior proximal biceps rupture, ganglion cyst on anterior aspect of conjoint tendon   Patient tolerated the surgery well. No complications .Patient was taken to PACU where she was stabilized and then transferred to the orthopedic floor.  Patient started on 325 mg ASA per day. Foot pumps applied bilaterally at 80 mm hgb. Heels elevated off bed with rolled towels. No evidence of DVT. Calves non tender. Negative Homan. Physical therapy started on day #1  with OT starting on  day #1 for ADL and assisted devices. Patient has done well with therapy. Ambulated 50 feet upon being discharged.  Patient's IV And Foley as well as the hemovac were discontinued on day #1 .   She was given perioperative antibiotics:  Anti-infectives (From admission, onward)   Start     Dose/Rate Route  Frequency Ordered Stop   01/08/17 1800  ceFAZolin (ANCEF) 2 g in dextrose 5 % 100 mL IVPB     2 g 200 mL/hr over 30 Minutes Intravenous Every 6 hours 01/08/17 1444 01/09/17 0631   01/08/17 1430  ceFAZolin (ANCEF) IVPB 2g/100 mL premix  Status:  Discontinued      2 g 200 mL/hr over 30 Minutes Intravenous Every 6 hours 01/08/17 1422 01/08/17 1443    .  She was fitted with AV 1 compression foot pump devices, instructed on heel pumps, early ambulation, and fitted with TED stockings bilaterally for DVT prophylaxis.  She benefited maximally from the hospital stay and there were no complications.    Recent vital signs:  Vitals:   01/09/17 0421 01/09/17 0819  BP: 118/64 (!) 113/59  Pulse: 73 79  Resp: 18 20  Temp: 97.6 F (36.4 C) 97.6 F (36.4 C)  SpO2: 94% 91%    Recent laboratory studies:  Lab Results  Component Value Date   HGB 13.2 01/09/2017   HGB 13.6 01/08/2017   HGB 14.6 12/24/2016   Lab Results  Component Value Date   WBC 12.5 (H) 01/09/2017   PLT 210 01/09/2017   Lab Results  Component Value Date   INR 0.89 12/24/2016   Lab Results  Component Value Date   NA 137 01/09/2017   K 4.6 01/09/2017   CL 102 01/09/2017   CO2 28 01/09/2017   BUN 21 (H) 01/09/2017   CREATININE 0.95 01/09/2017   GLUCOSE 129 (H) 01/09/2017    Discharge Medications:   Allergies as of 01/09/2017      Reactions   Tequin [gatifloxacin] Shortness Of Breath   Keflex [cephalexin] Other (See Comments)   Unknown   Lisinopril Other (See Comments)   Unknown      Medication List    TAKE these medications   albuterol 108 (90 Base) MCG/ACT inhaler Commonly known as:  PROVENTIL HFA;VENTOLIN HFA Inhale 2 puffs into the lungs every 6 (six) hours as needed for wheezing or shortness of breath.   allopurinol 100 MG tablet Commonly known as:  ZYLOPRIM Take 200 mg by mouth daily. In am.   diclofenac 1.3 % Ptch Commonly known as:  FLECTOR Place 1 patch onto the skin daily as needed (for pain).   ezetimibe 10 MG tablet Commonly known as:  ZETIA Take 10 mg by mouth at bedtime.   felodipine 10 MG 24 hr tablet Commonly known as:  PLENDIL Take 10 mg by mouth at bedtime.   fluticasone 50 MCG/ACT nasal spray Commonly known as:  FLONASE Place 2  sprays into both nostrils daily as needed for allergies.   fluticasone-salmeterol 115-21 MCG/ACT inhaler Commonly known as:  ADVAIR HFA Inhale 2 puffs into the lungs 2 (two) times daily.   furosemide 20 MG tablet Commonly known as:  LASIX Take 20-40 mg by mouth daily as needed for fluid.   GUAIFENESIN 1200 PO Take 1 tablet by mouth every 12 (twelve) hours as needed.   losartan 100 MG tablet Commonly known as:  COZAAR Take 100 mg by mouth daily. In am   metoprolol succinate 100 MG 24 hr tablet Commonly known as:  TOPROL-XL Take 100 mg by mouth daily. Take with or immediately following a meal.  In am.   nystatin 100000 UNIT/ML suspension Commonly known as:  MYCOSTATIN Take 5 mLs by mouth 4 (four) times daily as needed (for trush).   omeprazole 40 MG capsule Commonly known as:  PRILOSEC Take 40 mg  by mouth daily.   oxyCODONE 15 MG immediate release tablet Commonly known as:  ROXICODONE Take 15 mg by mouth 5 (five) times daily as needed for pain.   pramipexole 0.5 MG tablet Commonly known as:  MIRAPEX Take 0.5 mg by mouth at bedtime. Takes 1-2 tablets at night.   promethazine 25 MG tablet Commonly known as:  PHENERGAN Take 25 mg by mouth every 6 (six) hours as needed for nausea or vomiting.   theophylline 400 MG 24 hr capsule Commonly known as:  THEO-24 Take 400 mg by mouth daily. In am.   tiotropium 18 MCG inhalation capsule Commonly known as:  SPIRIVA Place 18 mcg into inhaler and inhale daily. 2 puffs daily.   triamcinolone cream 0.1 % Commonly known as:  KENALOG Apply 1 application topically 2 (two) times daily as needed (for rash).   Vitamin D (Ergocalciferol) 50000 units Caps capsule Commonly known as:  DRISDOL Take 50,000 Units by mouth every 14 (fourteen) days.       Diagnostic Studies: Ct Shoulder Right Wo Contrast  Result Date: 12/10/2016 CLINICAL DATA:  Chronic right shoulder pain. EXAM: CT OF THE UPPER RIGHT EXTREMITY WITHOUT CONTRAST TECHNIQUE:  Multidetector CT imaging of the upper right extremity was performed according to the standard protocol. COMPARISON:  11/18/2016 MRI FINDINGS: Bones/Joint/Cartilage The is a fracture of the anterior rim of the glenoid shown on images 31 through 32 of series 8, involved fragment about 14 cm craniocaudad by 3 mm by 3 mm. For there is also a bony fragment measuring 0.9 by 0.2 by 0.4 cm tracking adjacent to the subscapularis muscle, probably in the subscapular recess. The humeral head sits slightly anteriorly on the glenoid, and there is irregularity along the posterosuperior humeral head suspicious for a small Bankart impaction. Moderate AC joint spurring. Subacromial morphology is type 2 (curved). Abnormally acromial-humeral distance at 2.5 mm. Spurring of the humeral head noted. Ligaments Suboptimally assessed by CT. Muscles and Tendons Atrophic supraspinatus with some atrophic portions of the infraspinatus and subscapularis. Thickened distal subscapularis tendon. Soft tissues There is evidence of lower cervical spondylosis. Atherosclerotic calcification of the aortic arch and branch vessels. IMPRESSION: 1. Small fracture of the anterior rim of the glenoid, probably a bony Bankart injury. There is also mild impaction along the posterosuperior humeral head suspicious for a Bankart injury. Small bony fragment tracking within or along the subscapularis muscle, possibly in the subscapular recess. 2. Scattered atrophic regions of the rotator cuff musculature. Thickened distal subscapularis tendon probably related to tendinopathy. 3. Diminished acromial -humeral distance which can impinge on the subscapularis. 4. Moderate degenerative AC joint spurring. Mild spurring of the humeral head. 5. Cervical spondylosis. 6.  Aortic Atherosclerosis (ICD10-I70.0). Electronically Signed   By: Gaylyn Rong M.D.   On: 12/10/2016 12:01   Dg Shoulder Right Port  Result Date: 01/08/2017 CLINICAL DATA:  Status post right shoulder  replacement. EXAM: PORTABLE RIGHT SHOULDER COMPARISON:  CT of the right shoulder 12/10/2016 FINDINGS: Post recent right total shoulder arthroplasty with normal alignment of the orthopedic hardware. Postsurgical soft tissue emphysema, surgical drain and skin staples are noted. No evidence of fracture. IMPRESSION: Post recent total right shoulder arthroplasty without evidence of immediate complications. Electronically Signed   By: Ted Mcalpine M.D.   On: 01/08/2017 13:02    Disposition: 82-Discharged to home/self-care with a planned acute care hospital inpt readmission       Signed: Indiyah Paone R. 01/09/2017, 8:24 AM

## 2017-01-09 NOTE — Evaluation (Signed)
Occupational Therapy Evaluation Patient Details Name: Brenda Glenn Ptacek MRN: [id number] DOB: 14-Jan-1941 Today's Date: 01/09/2017    History of Present Illness 76 y/o female s/p R total shoulder replacement 11/9.   Clinical Impression   Patient was sitting up in recliner with family present when OT arrived. She had recently finished PT, and was still having pain but stated it was a little better. Willing to work with OT at this time. Patient and family were educated on RUE limitations and adaptive techniques for dressing and bathing. Verbalized understanding to all training. Patient and family educated on sling use and how to donn/doff, as well as home exercise program. Written home exercise program provided. Patient able to perform one set of 15 reps for each exercise. OT performed PROM RUE within the 90 degree forward flexion limitations. Patient and family verbalized understanding of all training, and need for safety due to balance limitations.    Follow Up Recommendations  Home health OT    Equipment Recommendations  Tub/shower seat    Recommendations for Other Services       Precautions / Restrictions Precautions Precautions: Shoulder Type of Shoulder Precautions: total Shoulder Interventions: Shoulder sling/immobilizer Precaution Booklet Issued: No Restrictions Weight Bearing Restrictions: Yes RUE Weight Bearing: Non weight bearing      Mobility Bed Mobility               General bed mobility comments: in recliner  Transfers Overall transfer level: Modified independent Equipment used: Straight cane             General transfer comment: Pt was able to rise to standing w/o direct assist, but needed min guard for balance during standing pendulum exercises.    Balance Overall balance assessment: Modified Independent                                         ADL either performed or assessed with clinical judgement   ADL   Eating/Feeding:  Set up       Upper Body Bathing: Adhering to UE precautions;Moderate assistance   Lower Body Bathing: Maximal assistance   Upper Body Dressing : Moderate assistance;Adhering to UE precautions   Lower Body Dressing: Moderate assistance   Toilet Transfer: Min guard   Toileting- Clothing Manipulation and Hygiene: Minimal assistance               Vision Patient Visual Report: No change from baseline       Perception     Praxis      Pertinent Vitals/Pain Pain Assessment: 0-10 Pain Score: 7  Pain Location: R shoulder, back Pain Intervention(s): Limited activity within patient's tolerance;Repositioned;Ice applied;Monitored during session     Hand Dominance Right   Extremity/Trunk Assessment Upper Extremity Assessment Upper Extremity Assessment: RUE deficits/detail RUE Deficits / Details: PROM in foward flexion to 90 degrees per MD recommendations. RUE: Unable to fully assess due to immobilization   Lower Extremity Assessment Lower Extremity Assessment: Defer to PT evaluation       Communication Communication Communication: No difficulties   Cognition Arousal/Alertness: Awake/alert Behavior During Therapy: WFL for tasks assessed/performed Overall Cognitive Status: Within Functional Limits for tasks assessed                                     General Comments  Exercises Shoulder Exercises Pendulum Exercise: 15 reps;Standing Shoulder Flexion: PROM;10 reps;Other (comment)(staying within 90 degrees for forward flexion. ) Elbow Flexion: 15 reps Elbow Extension: 15 reps Wrist Flexion: 15 reps Wrist Extension: 15 reps Digit Composite Flexion: 15 reps Composite Extension: 15 reps Other Exercises Other Exercises: Seated AROM BLE ankle pumps, LAQ.   Shoulder Instructions Shoulder Instructions Donning/doffing shirt without moving shoulder: Patient able to independently direct caregiver;Supervision/safety Method for sponge bathing under  operated UE: Patient able to independently direct caregiver;Supervision/safety Donning/doffing sling/immobilizer: Caregiver independent with task Pendulum exercises (written home exercise program): Minimal assistance ROM for elbow, wrist and digits of operated UE: Set-up Sling wearing schedule (on at all times/off for ADL's): Independent    Home Living Family/patient expects to be discharged to:: Private residence Living Arrangements: Spouse/significant other Available Help at Discharge: Family   Home Access: Stairs to enter Secretary/administratorntrance Stairs-Number of Steps: 3 Entrance Stairs-Rails: Left;Right Home Layout: One level     Bathroom Shower/Tub: Producer, television/film/videoWalk-in shower   Bathroom Toilet: Handicapped height Bathroom Accessibility: Yes   Home Equipment: Cane - single point          Prior Functioning/Environment Level of Independence: Independent with assistive device(s)        Comments: Pt reports she does not always use cane in the home, generally able to be active but has a lot of chronic pain that limits her        OT Problem List: Decreased knowledge of precautions;Decreased range of motion;Impaired balance (sitting and/or standing);Impaired UE functional use      OT Treatment/Interventions: Self-care/ADL training;DME and/or AE instruction;Therapeutic exercise;Patient/family education    OT Goals(Current goals can be found in the care plan section) Acute Rehab OT Goals Patient Stated Goal: have Home Health to get stronger before going to outpatient OT Goal Formulation: With patient/family Time For Goal Achievement: 01/23/17 Potential to Achieve Goals: Good  OT Frequency: Min 1X/week   Barriers to D/C:            Co-evaluation              AM-PAC PT "6 Clicks" Daily Activity     Outcome Measure Help from another person eating meals?: A Little Help from another person taking care of personal grooming?: A Little Help from another person toileting, which includes using  toliet, bedpan, or urinal?: A Little Help from another person bathing (including washing, rinsing, drying)?: A Lot Help from another person to put on and taking off regular upper body clothing?: A Lot Help from another person to put on and taking off regular lower body clothing?: A Lot 6 Click Score: 15   End of Session Equipment Utilized During Treatment: Gait belt;Oxygen;Other (comment)(RUE abduction sling)  Activity Tolerance: Patient tolerated treatment well Patient left: in chair;with call bell/phone within reach;with family/visitor present  OT Visit Diagnosis: Unsteadiness on feet (R26.81)                Time: 1030-1100 OT Time Calculation (min): 30 min Charges:  OT General Charges $OT Visit: 1 Visit OT Evaluation $OT Eval Low Complexity: 1 Low OT Treatments $Self Care/Home Management : 8-22 mins $Therapeutic Exercise: 8-22 mins G-Codes:     Kirstie PeriWendy Aquarius Tremper, OTR/L  Brendi Mccarroll L 01/09/2017, 11:57 AM

## 2017-01-11 ENCOUNTER — Encounter: Payer: Self-pay | Admitting: Orthopedic Surgery

## 2017-01-12 LAB — SURGICAL PATHOLOGY

## 2017-01-26 MED FILL — Sodium Chloride Irrigation Soln 0.9%: Qty: 4000 | Status: AC

## 2017-01-26 MED FILL — Neomycin-Polymyxin B GU Irrigation Soln: Qty: 16 | Status: AC

## 2017-06-02 ENCOUNTER — Other Ambulatory Visit: Payer: Self-pay | Admitting: Student

## 2017-06-02 DIAGNOSIS — R2689 Other abnormalities of gait and mobility: Secondary | ICD-10-CM

## 2017-06-02 DIAGNOSIS — M5416 Radiculopathy, lumbar region: Secondary | ICD-10-CM

## 2017-06-11 ENCOUNTER — Ambulatory Visit
Admission: RE | Admit: 2017-06-11 | Discharge: 2017-06-11 | Disposition: A | Discharge status: Home / Self Care | Payer: Medicare Other | Source: Ambulatory Visit | Attending: Student | Admitting: Student

## 2017-06-11 DIAGNOSIS — M5416 Radiculopathy, lumbar region: Secondary | ICD-10-CM | POA: Insufficient documentation

## 2017-06-11 DIAGNOSIS — M4807 Spinal stenosis, lumbosacral region: Secondary | ICD-10-CM | POA: Insufficient documentation

## 2017-06-11 DIAGNOSIS — M48061 Spinal stenosis, lumbar region without neurogenic claudication: Secondary | ICD-10-CM | POA: Diagnosis not present

## 2017-06-11 DIAGNOSIS — M5136 Other intervertebral disc degeneration, lumbar region: Secondary | ICD-10-CM | POA: Insufficient documentation

## 2017-06-11 DIAGNOSIS — R2689 Other abnormalities of gait and mobility: Secondary | ICD-10-CM | POA: Insufficient documentation

## 2017-06-11 DIAGNOSIS — M7138 Other bursal cyst, other site: Secondary | ICD-10-CM | POA: Diagnosis not present

## 2017-06-20 ENCOUNTER — Emergency Department: Payer: Medicare Other

## 2017-06-20 ENCOUNTER — Inpatient Hospital Stay
Admission: EM | Admit: 2017-06-20 | Discharge: 2017-06-21 | DRG: 683 | Disposition: A | Discharge status: Home Health | Payer: Medicare Other | Attending: Specialist | Admitting: Specialist

## 2017-06-20 ENCOUNTER — Inpatient Hospital Stay: Payer: Medicare Other

## 2017-06-20 ENCOUNTER — Other Ambulatory Visit: Payer: Self-pay

## 2017-06-20 ENCOUNTER — Encounter: Payer: Self-pay | Admitting: Emergency Medicine

## 2017-06-20 DIAGNOSIS — N179 Acute kidney failure, unspecified: Principal | ICD-10-CM | POA: Diagnosis present

## 2017-06-20 DIAGNOSIS — E119 Type 2 diabetes mellitus without complications: Secondary | ICD-10-CM | POA: Diagnosis not present

## 2017-06-20 DIAGNOSIS — R531 Weakness: Secondary | ICD-10-CM | POA: Diagnosis not present

## 2017-06-20 DIAGNOSIS — M549 Dorsalgia, unspecified: Secondary | ICD-10-CM | POA: Diagnosis not present

## 2017-06-20 DIAGNOSIS — Z803 Family history of malignant neoplasm of breast: Secondary | ICD-10-CM

## 2017-06-20 DIAGNOSIS — F17210 Nicotine dependence, cigarettes, uncomplicated: Secondary | ICD-10-CM | POA: Diagnosis present

## 2017-06-20 DIAGNOSIS — R200 Anesthesia of skin: Secondary | ICD-10-CM | POA: Diagnosis not present

## 2017-06-20 DIAGNOSIS — K219 Gastro-esophageal reflux disease without esophagitis: Secondary | ICD-10-CM | POA: Diagnosis present

## 2017-06-20 DIAGNOSIS — I10 Essential (primary) hypertension: Secondary | ICD-10-CM | POA: Diagnosis not present

## 2017-06-20 DIAGNOSIS — Z9981 Dependence on supplemental oxygen: Secondary | ICD-10-CM

## 2017-06-20 DIAGNOSIS — G8929 Other chronic pain: Secondary | ICD-10-CM | POA: Diagnosis not present

## 2017-06-20 DIAGNOSIS — R29898 Other symptoms and signs involving the musculoskeletal system: Secondary | ICD-10-CM

## 2017-06-20 DIAGNOSIS — Z66 Do not resuscitate: Secondary | ICD-10-CM | POA: Diagnosis not present

## 2017-06-20 DIAGNOSIS — E860 Dehydration: Secondary | ICD-10-CM | POA: Diagnosis present

## 2017-06-20 DIAGNOSIS — G4733 Obstructive sleep apnea (adult) (pediatric): Secondary | ICD-10-CM | POA: Diagnosis not present

## 2017-06-20 DIAGNOSIS — G2581 Restless legs syndrome: Secondary | ICD-10-CM | POA: Diagnosis present

## 2017-06-20 DIAGNOSIS — Z6836 Body mass index (BMI) 36.0-36.9, adult: Secondary | ICD-10-CM

## 2017-06-20 DIAGNOSIS — E785 Hyperlipidemia, unspecified: Secondary | ICD-10-CM | POA: Diagnosis not present

## 2017-06-20 DIAGNOSIS — N289 Disorder of kidney and ureter, unspecified: Secondary | ICD-10-CM | POA: Diagnosis present

## 2017-06-20 DIAGNOSIS — E669 Obesity, unspecified: Secondary | ICD-10-CM | POA: Diagnosis not present

## 2017-06-20 DIAGNOSIS — G629 Polyneuropathy, unspecified: Secondary | ICD-10-CM | POA: Diagnosis not present

## 2017-06-20 DIAGNOSIS — R262 Difficulty in walking, not elsewhere classified: Secondary | ICD-10-CM | POA: Diagnosis not present

## 2017-06-20 DIAGNOSIS — J9611 Chronic respiratory failure with hypoxia: Secondary | ICD-10-CM | POA: Diagnosis present

## 2017-06-20 DIAGNOSIS — M109 Gout, unspecified: Secondary | ICD-10-CM | POA: Diagnosis not present

## 2017-06-20 DIAGNOSIS — M4802 Spinal stenosis, cervical region: Secondary | ICD-10-CM | POA: Diagnosis present

## 2017-06-20 DIAGNOSIS — G459 Transient cerebral ischemic attack, unspecified: Secondary | ICD-10-CM

## 2017-06-20 DIAGNOSIS — J449 Chronic obstructive pulmonary disease, unspecified: Secondary | ICD-10-CM | POA: Diagnosis not present

## 2017-06-20 LAB — CBC WITH DIFFERENTIAL/PLATELET
BASOS PCT: 1 %
Basophils Absolute: 0.1 10*3/uL (ref 0–0.1)
EOS ABS: 0.1 10*3/uL (ref 0–0.7)
Eosinophils Relative: 1 %
HCT: 39.1 % (ref 35.0–47.0)
Hemoglobin: 12.6 g/dL (ref 12.0–16.0)
Lymphocytes Relative: 18 %
Lymphs Abs: 1.6 10*3/uL (ref 1.0–3.6)
MCH: 30.9 pg (ref 26.0–34.0)
MCHC: 32.1 g/dL (ref 32.0–36.0)
MCV: 96.2 fL (ref 80.0–100.0)
MONO ABS: 1 10*3/uL — AB (ref 0.2–0.9)
MONOS PCT: 11 %
NEUTROS PCT: 69 %
Neutro Abs: 6.3 10*3/uL (ref 1.4–6.5)
PLATELETS: 204 10*3/uL (ref 150–440)
RBC: 4.07 MIL/uL (ref 3.80–5.20)
RDW: 18 % — AB (ref 11.5–14.5)
WBC: 9 10*3/uL (ref 3.6–11.0)

## 2017-06-20 LAB — URINALYSIS, ROUTINE W REFLEX MICROSCOPIC
BILIRUBIN URINE: NEGATIVE
GLUCOSE, UA: NEGATIVE mg/dL
KETONES UR: NEGATIVE mg/dL
Nitrite: NEGATIVE
Protein, ur: NEGATIVE mg/dL
Specific Gravity, Urine: 1.01 (ref 1.005–1.030)
pH: 5 (ref 5.0–8.0)

## 2017-06-20 LAB — APTT: aPTT: 36 seconds (ref 24–36)

## 2017-06-20 LAB — COMPREHENSIVE METABOLIC PANEL
ALT: 15 U/L (ref 14–54)
AST: 24 U/L (ref 15–41)
Albumin: 3.4 g/dL — ABNORMAL LOW (ref 3.5–5.0)
Alkaline Phosphatase: 140 U/L — ABNORMAL HIGH (ref 38–126)
Anion gap: 13 (ref 5–15)
BUN: 76 mg/dL — ABNORMAL HIGH (ref 6–20)
CO2: 30 mmol/L (ref 22–32)
Calcium: 9.3 mg/dL (ref 8.9–10.3)
Chloride: 94 mmol/L — ABNORMAL LOW (ref 101–111)
Creatinine, Ser: 2.11 mg/dL — ABNORMAL HIGH (ref 0.44–1.00)
GFR calc Af Amer: 25 mL/min — ABNORMAL LOW (ref >60)
GFR calc non Af Amer: 22 mL/min — ABNORMAL LOW (ref >60)
GLUCOSE: 109 mg/dL — AB (ref 65–99)
POTASSIUM: 4.2 mmol/L (ref 3.5–5.1)
SODIUM: 137 mmol/L (ref 135–145)
Total Bilirubin: 0.5 mg/dL (ref 0.3–1.2)
Total Protein: 6.7 g/dL (ref 6.5–8.1)

## 2017-06-20 LAB — GLUCOSE, CAPILLARY
GLUCOSE-CAPILLARY: 96 mg/dL (ref 65–99)
GLUCOSE-CAPILLARY: 132 mg/dL — AB (ref 65–99)
GLUCOSE-CAPILLARY: 103 mg/dL — AB (ref 65–99)

## 2017-06-20 LAB — PROTIME-INR
INR: 1.06
Prothrombin Time: 13.7 seconds (ref 11.4–15.2)

## 2017-06-20 LAB — TROPONIN I: Troponin I: <0.03 ng/mL (ref <0.03)

## 2017-06-20 LAB — MRSA PCR SCREENING: MRSA BY PCR: NEGATIVE

## 2017-06-20 MED ORDER — GUAIFENESIN ER 600 MG PO TB12: 600.0000 mg | ORAL_TABLET | Freq: Two times a day (BID) | ORAL | Status: DC | PRN

## 2017-06-20 MED ORDER — SENNOSIDES-DOCUSATE SODIUM 8.6-50 MG PO TABS: 1.0000 | ORAL_TABLET | Freq: Every evening | ORAL | Status: DC | PRN

## 2017-06-20 MED ORDER — SODIUM CHLORIDE 0.9 % IV SOLN
INTRAVENOUS | Status: DC
  Administered 2017-06-20: 13:00:00 via INTRAVENOUS

## 2017-06-20 MED ORDER — INSULIN ASPART 100 UNIT/ML ~~LOC~~ SOLN
0.0000 [IU] | Freq: Three times a day (TID) | SUBCUTANEOUS | Status: DC
  Administered 2017-06-20: 1 [IU] via SUBCUTANEOUS
  Filled 2017-06-20: qty 1

## 2017-06-20 MED ORDER — ALLOPURINOL 100 MG PO TABS
200.0000 mg | ORAL_TABLET | Freq: Every day | ORAL | Status: DC
  Administered 2017-06-20 – 2017-06-21 (×2): 200 mg via ORAL
  Filled 2017-06-20 (×2): qty 2

## 2017-06-20 MED ORDER — OXYCODONE-ACETAMINOPHEN 5-325 MG PO TABS: 1.0000 | ORAL_TABLET | Freq: Four times a day (QID) | ORAL | Status: DC | PRN

## 2017-06-20 MED ORDER — MOMETASONE FURO-FORMOTEROL FUM 200-5 MCG/ACT IN AERO
2.0000 | INHALATION_SPRAY | Freq: Two times a day (BID) | RESPIRATORY_TRACT | Status: DC
  Administered 2017-06-20 – 2017-06-21 (×3): 2 via RESPIRATORY_TRACT
  Filled 2017-06-20 (×2): qty 8.8

## 2017-06-20 MED ORDER — GABAPENTIN 100 MG PO CAPS
100.0000 mg | ORAL_CAPSULE | Freq: Three times a day (TID) | ORAL | Status: DC
  Administered 2017-06-20 – 2017-06-21 (×3): 100 mg via ORAL
  Filled 2017-06-20 (×3): qty 1

## 2017-06-20 MED ORDER — TIOTROPIUM BROMIDE MONOHYDRATE 18 MCG IN CAPS
18.0000 ug | ORAL_CAPSULE | Freq: Every day | RESPIRATORY_TRACT | Status: DC
  Administered 2017-06-20 – 2017-06-21 (×2): 18 ug via RESPIRATORY_TRACT
  Filled 2017-06-20: qty 5

## 2017-06-20 MED ORDER — OXYCODONE HCL 5 MG PO TABS
15.0000 mg | ORAL_TABLET | ORAL | Status: DC
  Administered 2017-06-20 – 2017-06-21 (×7): 15 mg via ORAL
  Filled 2017-06-20 (×7): qty 3

## 2017-06-20 MED ORDER — ATORVASTATIN CALCIUM 20 MG PO TABS
40.0000 mg | ORAL_TABLET | Freq: Every day | ORAL | Status: DC
  Administered 2017-06-20: 40 mg via ORAL
  Filled 2017-06-20: qty 2

## 2017-06-20 MED ORDER — INSULIN ASPART 100 UNIT/ML ~~LOC~~ SOLN: 0.0000 [IU] | Freq: Every day | SUBCUTANEOUS | Status: DC

## 2017-06-20 MED ORDER — ASPIRIN EC 325 MG PO TBEC
325.0000 mg | DELAYED_RELEASE_TABLET | Freq: Every day | ORAL | Status: DC
  Administered 2017-06-20 – 2017-06-21 (×2): 325 mg via ORAL
  Filled 2017-06-20 (×2): qty 1

## 2017-06-20 MED ORDER — VALACYCLOVIR HCL 500 MG PO TABS: 500.0000 mg | ORAL_TABLET | Freq: Two times a day (BID) | ORAL | Status: DC | PRN

## 2017-06-20 MED ORDER — HEPARIN SODIUM (PORCINE) 5000 UNIT/ML IJ SOLN
5000.0000 [IU] | Freq: Three times a day (TID) | INTRAMUSCULAR | Status: DC
  Administered 2017-06-20 – 2017-06-21 (×3): 5000 [IU] via SUBCUTANEOUS
  Filled 2017-06-20 (×3): qty 1

## 2017-06-20 MED ORDER — SODIUM CHLORIDE 0.9 % IV BOLUS
1000.0000 mL | Freq: Once | INTRAVENOUS | Status: AC
  Administered 2017-06-20: 1000 mL via INTRAVENOUS

## 2017-06-20 MED ORDER — STROKE: EARLY STAGES OF RECOVERY BOOK
Freq: Once | Status: AC
  Administered 2017-06-20: 16:00:00
  Filled 2017-06-20: qty 1

## 2017-06-20 MED ORDER — THEOPHYLLINE ER 400 MG PO TB24
400.0000 mg | ORAL_TABLET | Freq: Every day | ORAL | Status: DC
  Administered 2017-06-20 – 2017-06-21 (×2): 400 mg via ORAL
  Filled 2017-06-20 (×2): qty 1

## 2017-06-20 MED ORDER — ONDANSETRON HCL 4 MG/2ML IJ SOLN: 4.0000 mg | Freq: Four times a day (QID) | INTRAMUSCULAR | Status: DC | PRN

## 2017-06-20 MED ORDER — PANTOPRAZOLE SODIUM 40 MG PO TBEC
40.0000 mg | DELAYED_RELEASE_TABLET | Freq: Every day | ORAL | Status: DC
Start: 2017-06-20 — End: 2017-06-21
  Administered 2017-06-20 – 2017-06-21 (×2): 40 mg via ORAL
  Filled 2017-06-20 (×2): qty 1

## 2017-06-20 MED ORDER — EZETIMIBE 10 MG PO TABS
10.0000 mg | ORAL_TABLET | Freq: Every day | ORAL | Status: DC
  Administered 2017-06-20: 10 mg via ORAL
  Filled 2017-06-20 (×2): qty 1

## 2017-06-20 MED ORDER — PRAMIPEXOLE DIHYDROCHLORIDE 1 MG PO TABS
1.0000 mg | ORAL_TABLET | Freq: Every day | ORAL | Status: DC
  Administered 2017-06-20: 1 mg via ORAL
  Filled 2017-06-20 (×2): qty 4

## 2017-06-20 MED ORDER — NICOTINE 14 MG/24HR TD PT24
14.0000 mg | MEDICATED_PATCH | Freq: Every day | TRANSDERMAL | Status: DC
  Administered 2017-06-20 – 2017-06-21 (×2): 14 mg via TRANSDERMAL
  Filled 2017-06-20 (×2): qty 1

## 2017-06-20 MED ORDER — ACETAMINOPHEN 325 MG PO TABS: 650.0000 mg | ORAL_TABLET | Freq: Four times a day (QID) | ORAL | Status: DC | PRN

## 2017-06-20 MED ORDER — LORAZEPAM 2 MG/ML IJ SOLN
1.0000 mg | Freq: Once | INTRAMUSCULAR | Status: AC
  Administered 2017-06-20: 1 mg via INTRAVENOUS
  Filled 2017-06-20: qty 1

## 2017-06-20 MED ORDER — FLUTICASONE PROPIONATE 50 MCG/ACT NA SUSP: 2.0000 | Freq: Every day | NASAL | Status: DC | PRN

## 2017-06-20 MED ORDER — PROMETHAZINE HCL 25 MG PO TABS: 25.0000 mg | ORAL_TABLET | Freq: Four times a day (QID) | ORAL | Status: DC | PRN

## 2017-06-20 MED ORDER — ALBUTEROL SULFATE (2.5 MG/3ML) 0.083% IN NEBU: 2.5000 mg | INHALATION_SOLUTION | RESPIRATORY_TRACT | Status: DC | PRN

## 2017-06-20 NOTE — ED Notes (Signed)
Pt returned from CT °

## 2017-06-20 NOTE — ED Triage Notes (Signed)
Pt arrived via POV with reports of increased numbness to left hand and difficulty walking.  Pt states she has numbness in the left hand, but states she tried to pick up a cup this morning and dropped it.    Pt also states that she has been getting weaker and it is more difficult to walk today.

## 2017-06-20 NOTE — ED Provider Notes (Signed)
[facility] Emergency Department Provider Note ____________________________________________   First MD Initiated Contact with Patient 06/20/17 0815     (approximate)  I have reviewed the triage vital signs and the nursing notes.   HISTORY  Chief Complaint Numbness    HPI Brenda Glenn is a 77 y.o. female with PMH as noted below including COPD on home O2, who presents with left hand and arm weakness, acute onset sometime last night or this morning, and associated with difficulty gripping.  Patient states she has had trouble controlling the left hand.  She states she has numbness at baseline, but the motor symptoms are new.  The patient states she noticed it when she awoke this morning around 5 AM, and did not have it when she went to sleep last night.  She also reports increased weakness in her left leg and difficulty walking today.  This is also somewhat new.  The patient denies headache, vomiting, fever, increased shortness of breath from baseline, or any other recent illness.  Past Medical History:  Diagnosis Date  . Arthritis    all over  . Back pain   . COPD (chronic obstructive pulmonary disease) (HCC)    O2 use continuosly at 2 liters/ some  asthma symptoms  . Diabetes mellitus without complication (HCC)    diet controlled  . GERD (gastroesophageal reflux disease)   . Heart murmur    lifelong  . Hypercholesterolemia   . Hypertension    controlled on meds  . Pneumonia 2015   [facility] hospitalized  . Renal insufficiency    early stage kidney failure  . Shortness of breath dyspnea   . Sleep apnea    2nd sleep study said pt does not have sleep apnea  . Swelling    of feet and legs    Patient Active Problem List   Diagnosis Date Noted  . 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 Lymphedema 01/06/2016  . Chronic venous insufficiency 01/06/2016  . Pain in limb 01/06/2016  . COPD (chronic obstructive pulmonary disease) (HCC) 01/06/2016   . Abnormality of gait 01/06/2016    Past Surgical History:  Procedure Laterality Date  . ABDOMINAL HYSTERECTOMY  1988  . BACK SURGERY  1978 and 1995   residual nerve damage legs-weakness  . BLADDER SURGERY  2017  . CATARACT EXTRACTION Right   . CATARACT EXTRACTION W/PHACO Left 09/05/2014   Procedure: CATARACT EXTRACTION PHACO AND INTRAOCULAR LENS PLACEMENT (IOC);  Surgeon: Lockie Mola, MD;  Location: [facility];  Service: Ophthalmology;  Laterality: Left;  DIABETIC  . CHOLECYSTECTOMY  2011  . JOINT REPLACEMENT Right 2009  . REPLACEMENT TOTAL KNEE Right   . REVERSE SHOULDER ARTHROPLASTY Right 01/08/2017   Procedure: REVERSE SHOULDER ARTHROPLASTY;  Surgeon: Signa Kell, MD;  Location: [facility];  Service: Orthopedics;  Laterality: Right;    Prior to Admission medications   Medication Sig Start Date End Date Taking? Authorizing Provider  albuterol (PROVENTIL HFA;VENTOLIN HFA) 108 (90 Base) MCG/ACT inhaler Inhale 2 puffs into the lungs every 6 (six) hours as needed for wheezing or shortness of breath.   Yes [provider]  allopurinol (ZYLOPRIM) 100 MG tablet Take 200 mg by mouth daily. In am.   Yes [provider]  diclofenac (FLECTOR) 1.3 % PTCH Place 1 patch onto the skin daily as needed (for pain).   Yes [provider]  ezetimibe (ZETIA) 10 MG tablet Take 10 mg by mouth at bedtime.   Yes [provider]  felodipine (PLENDIL) 10 MG 24 hr tablet Take 10 mg by mouth at bedtime.   Yes [provider]  fluticasone (FLONASE) 50 MCG/ACT nasal spray Place 2 sprays into both nostrils daily as needed for allergies. 12/08/16  Yes [provider]  fluticasone-salmeterol (ADVAIR HFA) 115-21 MCG/ACT inhaler Inhale 2 puffs into the lungs 2 (two) times daily.    Yes [provider]  furosemide (LASIX) 20 MG tablet Take 40 mg by mouth daily.    Yes [provider]  gabapentin (NEURONTIN) 100 MG capsule Take 100 mg by  mouth 3 (three) times daily. 06/09/17  Yes [provider]  guaiFENesin (MUCINEX) 600 MG 12 hr tablet Take 600 mg by mouth every 12 (twelve) hours as needed for cough.   Yes [provider]  losartan (COZAAR) 100 MG tablet Take 100 mg by mouth daily. In am   Yes [provider]  metoprolol succinate (TOPROL-XL) 100 MG 24 hr tablet Take 100 mg by mouth daily. Take with or immediately following a meal.  In am.   Yes [provider]  nystatin (MYCOSTATIN) 100000 UNIT/ML suspension Take 5 mLs by mouth 4 (four) times daily as needed (for trush).  11/25/16  Yes [provider]  omeprazole (PRILOSEC) 40 MG capsule Take 40 mg by mouth daily.    Yes [provider]  oxyCODONE (ROXICODONE) 15 MG immediate release tablet Take 15 mg by mouth every 4 (four) hours.    Yes [provider]  pramipexole (MIRAPEX) 0.5 MG tablet Take 1 mg by mouth at bedtime.    Yes [provider]  promethazine (PHENERGAN) 25 MG tablet Take 25 mg by mouth every 6 (six) hours as needed for nausea or vomiting.   Yes [provider]  theophylline (THEO-24) 400 MG 24 hr capsule Take 400 mg by mouth daily. In am.   Yes [provider]  tiotropium (SPIRIVA) 18 MCG inhalation capsule Place 18 mcg into inhaler and inhale daily. 2 puffs daily.   Yes [provider]  triamcinolone cream (KENALOG) 0.1 % Apply 1 application topically 2 (two) times daily as needed (for rash).   Yes [provider]  valACYclovir (VALTREX) 500 MG tablet Take 500 mg by mouth 2 (two) times daily as needed (fever blisters).  05/07/17  Yes [provider]  Vitamin D, Ergocalciferol, (DRISDOL) 50000 UNITS CAPS capsule Take 50,000 Units by mouth every 14 (fourteen) days.   Yes [provider]    Allergies Tequin [gatifloxacin]; Keflex [cephalexin]; and Lisinopril  Family History  Problem Relation Age of Onset  . Breast cancer Mother 32  . Breast  cancer Paternal Grandmother 81    Social History Social History   Tobacco Use  . Smoking status: Current Every Day Smoker    Packs/day: 0.25    Years: 50.00    Pack years: 12.50    Types: Cigarettes  . Smokeless tobacco: Never Used  Substance Use Topics  . Alcohol use: Yes    Alcohol/week: 1.8 oz    Types: 3 Glasses of wine per week    Comment: rarely  . Drug use: No    Review of Systems  Constitutional: No fever. Eyes: No visual changes. ENT: Positive for neck pain. Cardiovascular: Denies chest pain. Respiratory: Denies shortness of breath. Gastrointestinal: No vomiting.  Genitourinary: Negative for dysuria.  Musculoskeletal: Negative for back pain. Skin: Negative for rash. Neurological: Positive for left arm weakness.   ____________________________________________   PHYSICAL EXAM:  VITAL SIGNS: ED  Triage Vitals  Enc Vitals Group     BP 06/20/17 0757 (!) 116/51     Pulse Rate 06/20/17 0757 64     Resp 06/20/17 0757 20     Temp 06/20/17 0757 98.6 F (37 C)     Temp Source 06/20/17 0757 Oral     SpO2 06/20/17 0757 92 %     Weight 06/20/17 0804 180 lb (81.6 kg)     Height 06/20/17 0804 4\' 11"  (1.499 m)     Head Circumference --      Peak Flow --      Pain Score 06/20/17 0804 0     Pain Loc --      Pain Edu? --      Excl. in GC? --     Constitutional: Alert and oriented.  No acute distress. Eyes: Conjunctivae are normal.  EOMI.  PERRLA. Head: Atraumatic. Nose: No congestion/rhinnorhea. Mouth/Throat: Mucous membranes are somewhat dry.   Neck: Normal range of motion.  No midline C-spine tenderness. Cardiovascular: Normal rate, regular rhythm. Grossly normal heart sounds.  Good peripheral circulation. Respiratory: Normal respiratory effort.  No retractions.  Somewhat decreased breath sounds bilaterally. Gastrointestinal: No distention.  Genitourinary: No CVA tenderness. Musculoskeletal: No lower extremity edema.  Extremities warm and well perfused.    Neurologic: Decreased grip strength and some ataxia to left hand.  Decreased strength to left leg.  No facial droop or other acute neurologic findings. Skin:  Skin is warm and dry. No rash noted. Psychiatric: Mood and affect are normal. Speech and behavior are normal.  ____________________________________________   LABS (all labs ordered are listed, but only abnormal results are displayed)  Labs Reviewed  CBC WITH DIFFERENTIAL/PLATELET - Abnormal; Notable for the following components:      Result Value   RDW 18.0 (*)    Monocytes Absolute 1.0 (*)    All other components within normal limits  COMPREHENSIVE METABOLIC PANEL - Abnormal; Notable for the following components:   Chloride 94 (*)    Glucose, Bld 109 (*)    BUN 76 (*)    Creatinine, Ser 2.11 (*)    Albumin 3.4 (*)    Alkaline Phosphatase 140 (*)    GFR calc non Af Amer 22 (*)    GFR calc Af Amer 25 (*)    All other components within normal limits  PROTIME-INR  APTT  TROPONIN I  URINALYSIS, ROUTINE W REFLEX MICROSCOPIC   ____________________________________________  EKG  ED ECG REPORT I, Dionne Bucy, the attending physician, personally viewed and interpreted this ECG.  Date: 06/20/2017 EKG Time: 819 Rate: 91 Rhythm: normal sinus rhythm QRS Axis: normal Intervals: normal ST/T Wave abnormalities: normal Narrative Interpretation: no evidence of acute ischemia  ____________________________________________  RADIOLOGY  CT head: No ICH or other acute findings  ____________________________________________   PROCEDURES  Procedure(s) performed: Yes  NIH Stroke Scale  Interval: Baseline Time: 10:07 AM Person Administering Scale: Dionne Bucy  Administer stroke scale items in the order listed. Record performance in each category after each subscale exam. Do not go back and change scores. Follow directions provided for each exam technique. Scores should reflect what the patient does, not what  the clinician thinks the patient can do. The clinician should record answers while administering the exam and work quickly. Except where indicated, the patient should not be coached (i.e., repeated requests to patient to make a special effort).   1a  Level of consciousness: 0  1b. LOC questions:  0  1c. LOC  commands: 0  2.  Best Gaze: 0  3.  Visual: 0  4. Facial Palsy: 0  5a.  Motor left arm: 1  5b.  Motor right arm: 0  6a. motor left leg: 2  6b  Motor right leg:  0  7. Limb Ataxia: 1  8.  Sensory: 0  9. Best Language:  0  10. Dysarthria: 0  11. Extinction and Inattention: 0  12. Distal motor function: 0   Total:   4    Procedures  Critical Care performed: No ____________________________________________   INITIAL IMPRESSION / ASSESSMENT AND PLAN / ED COURSE  Pertinent labs & imaging results that were available during my care of the patient were reviewed by me and considered in my medical decision making (see chart for details).  77 year old female with PMH as noted above presents with left hand and arm weakness, as well as some left leg weakness and difficulty walking which she noted when she woke this morning at 5 AM.  She states that she has somewhat chronic numbness particularly to the left hand and some difficulty walking at baseline.  She states she was at her baseline until she went to sleep last night.  On exam, vital signs are normal except for borderline hypoxia although the patient has COPD.  She is comfortable appearing.  The remainder the exam is as described above; the patient has some decreased strength to the right upper and right lower extremity, and ataxia to the right upper extremity.  By my assessment her NIH stroke scale is 4.  At this time, the patient is out of the tPA window.  Although she noticed her symptoms when she woke up around 5 AM, her last time at baseline was when she went to bed last night.  She has no LVO symptoms.  Differential also includes  cervical stenosis or radiculopathy.  I reviewed the past medical records in Epic; the patient actually had an MRI of her cervical and lumbar spine 1 week ago, which showed some stenosis but no acute impingement.  Plan: CT head, lab work-up, and likely admission.    ----------------------------------------- 9:51 AM on 06/20/2017 -----------------------------------------  CT head does not reveal any acute abnormalities.  Lab work-up shows AKI with elevated creatinine when compared to labs were 5 months ago.  The BUN is elevated and the patient does appear somewhat dehydrated.  IV fluids have been initiated.  Patient will require admission for stroke work-up as well as treatment and evaluation of AKI.  ----------------------------------------- 10:07 AM on 06/20/2017 -----------------------------------------   I signed the patient out to the hospitalist Dr. Imogene Burnhen for admission.  ____________________________________________   FINAL CLINICAL IMPRESSION(S) / ED DIAGNOSES  Final diagnoses:  Weakness of left upper extremity  Acute renal insufficiency      NEW MEDICATIONS STARTED DURING THIS VISIT:  New Prescriptions   No medications on file     Note:  This document was prepared using Dragon voice recognition software and may include unintentional dictation errors.    Dionne BucySiadecki, Randye Treichler, MD 06/20/17 1008

## 2017-06-20 NOTE — ED Notes (Signed)
Pt vague on time frame of when sxs first started Dr. Marisa SeverinSiadecki notified.

## 2017-06-20 NOTE — Progress Notes (Signed)
Advanced Care Plan.  Purpose of Encounter: CODE STATUS Parties in Attendance: The patient, her husband and me. Patient's Decisional Capacity: Yes. Medical Story:  Brenda Glenn  is a 77 y.o. female with a known history of multiple medical problems including hypertension, diabetes, hyperlipidemia, COPD, chronic respiratory failure on home oxygen, sleep apnea and chronic back pain.  The patient is being admitted for acute renal failure and the left side numbness and weakness, suspectrf CVA.  I discussed the patient condition, prognosis and CODE STATUS.  The patient stated that she does not want resuscitated and wants DNR.   Plan:  Code Status: DNR. Time spent discussing advance care planning: 17 minutes.

## 2017-06-20 NOTE — ED Notes (Signed)
Pt taken to CT.

## 2017-06-20 NOTE — ED Notes (Signed)
Pt states that she has had back pain x 40 years. States L hand numbness "for a while" but worse this AM. States weakness as well. Pt able to move all extremities on own. L leg appears most weak. No facial droop. No slurred speech. No vision changes. No drift in legs or arms noted. Denies blood thinner use. Pt keeping eyes closed during assessment. Pinpoint pupils noticed. Wears 2 L nasal cannula d/t COPD. Husband at bedside.

## 2017-06-20 NOTE — H&P (Signed)
[facility] [place] [facility]   PATIENT NAME: Brenda Glenn    MR#:  [id number]  DATE OF BIRTH:  [birth year]  DATE OF ADMISSION:  06/20/2017  PRIMARY CARE PHYSICIAN: Mickey Farberhies, David, MD   REQUESTING/REFERRING PHYSICIAN: Dionne BucySiadecki, Sebastian, MD  CHIEF COMPLAINT:   Chief Complaint  Patient presents with  . Numbness   Left arm weakness and numbness today HISTORY OF PRESENT ILLNESS:  Brenda Glenn  is a 77 y.o. female with a known history of multiple medical problems as below.  The patient presented to the ED with above chief complaints.  Actually the patient has had left arm numbness for a long time.  She has had worsening left arm weakness and numbness since this morning.  She cannot grip her left hand.  She also has left leg weakness.  But she denies any slurred speech, dysphagia.  CAT scan of the head is unremarkable.  But she was found AK I.  PAST MEDICAL HISTORY:   Past Medical History:  Diagnosis Date  . Arthritis    all over  . Back pain   . COPD (chronic obstructive pulmonary disease) (HCC)    O2 use continuosly at 2 liters/ some  asthma symptoms  . Diabetes mellitus without complication (HCC)    diet controlled  . GERD (gastroesophageal reflux disease)   . Heart murmur    lifelong  . Hypercholesterolemia   . Hypertension    controlled on meds  . Pneumonia 2015   [facility] hospitalized  . Renal insufficiency    early stage kidney failure  . Shortness of breath dyspnea   . Sleep apnea    2nd sleep study said pt does not have sleep apnea  . Swelling    of feet and legs    PAST SURGICAL HISTORY:   Past Surgical History:  Procedure Laterality Date  . ABDOMINAL HYSTERECTOMY  1988  . BACK SURGERY  1978 and 1995   residual nerve damage legs-weakness  . BLADDER SURGERY  2017  . CATARACT EXTRACTION Right   . CATARACT EXTRACTION W/PHACO Left 09/05/2014   Procedure: CATARACT EXTRACTION PHACO AND INTRAOCULAR LENS PLACEMENT (IOC);  Surgeon: Lockie Molahadwick  Brasington, MD;  [facility];  Service: Ophthalmology;  Laterality: Left;  DIABETIC  . CHOLECYSTECTOMY  2011  . JOINT REPLACEMENT Right 2009  . REPLACEMENT TOTAL KNEE Right   . REVERSE SHOULDER ARTHROPLASTY Right 01/08/2017   Procedure: REVERSE SHOULDER ARTHROPLASTY;  Surgeon: Signa KellPatel, Sunny, MD;  Location: [facility];  Service: Orthopedics;  Laterality: Right;    SOCIAL HISTORY:   Social History   Tobacco Use  . Smoking status: Current Every Day Smoker    Packs/day: 0.25    Years: 50.00    Pack years: 12.50    Types: Cigarettes  . Smokeless tobacco: Never Used  Substance Use Topics  . Alcohol use: Yes    Alcohol/week: 1.8 oz    Types: 3 Glasses of wine per week    Comment: rarely    FAMILY HISTORY:   Family History  Problem Relation Age of Onset  . Breast cancer Mother 7075  . Breast cancer Paternal Grandmother [id number]    DRUG ALLERGIES:   Allergies  Allergen Reactions  . Tequin [Gatifloxacin] Shortness Of Breath  . Keflex [Cephalexin] Other (See Comments)    Unknown  . Lisinopril Other (See Comments)    Unknown    REVIEW OF SYSTEMS:   Review of Systems  Constitutional: Positive for malaise/fatigue. Negative for chills and  fever.  HENT: Negative for sore throat.   Eyes: Negative for blurred vision and double vision.  Respiratory: Negative for cough, hemoptysis, shortness of breath, wheezing and stridor.   Cardiovascular: Negative for chest pain, palpitations, orthopnea and leg swelling.  Gastrointestinal: Negative for abdominal pain, blood in stool, diarrhea, melena, nausea and vomiting.  Genitourinary: Negative for dysuria, flank pain and hematuria.  Musculoskeletal: Negative for back pain and joint pain.  Skin: Negative for rash.  Neurological: Positive for focal weakness and weakness. Negative for dizziness, sensory change, seizures, loss of consciousness and headaches.       Left arm numbness.  Endo/Heme/Allergies: Negative for polydipsia.    Psychiatric/Behavioral: Negative for depression. The patient is not nervous/anxious.     MEDICATIONS AT HOME:   Prior to Admission medications   Medication Sig Start Date End Date Taking? Authorizing Provider  albuterol (PROVENTIL HFA;VENTOLIN HFA) 108 (90 Base) MCG/ACT inhaler Inhale 2 puffs into the lungs every 6 (six) hours as needed for wheezing or shortness of breath.   Yes [provider]  allopurinol (ZYLOPRIM) 100 MG tablet Take 200 mg by mouth daily. In am.   Yes [provider]  diclofenac (FLECTOR) 1.3 % PTCH Place 1 patch onto the skin daily as needed (for pain).   Yes [provider]  ezetimibe (ZETIA) 10 MG tablet Take 10 mg by mouth at bedtime.   Yes [provider]  felodipine (PLENDIL) 10 MG 24 hr tablet Take 10 mg by mouth at bedtime.   Yes [provider]  fluticasone (FLONASE) 50 MCG/ACT nasal spray Place 2 sprays into both nostrils daily as needed for allergies. 12/08/16  Yes [provider]  fluticasone-salmeterol (ADVAIR HFA) 115-21 MCG/ACT inhaler Inhale 2 puffs into the lungs 2 (two) times daily.    Yes [provider]  furosemide (LASIX) 20 MG tablet Take 40 mg by mouth daily.    Yes [provider]  gabapentin (NEURONTIN) 100 MG capsule Take 100 mg by mouth 3 (three) times daily. 06/09/17  Yes [provider]  guaiFENesin (MUCINEX) 600 MG 12 hr tablet Take 600 mg by mouth every 12 (twelve) hours as needed for cough.   Yes [provider]  losartan (COZAAR) 100 MG tablet Take 100 mg by mouth daily. In am   Yes [provider]  metoprolol succinate (TOPROL-XL) 100 MG 24 hr tablet Take 100 mg by mouth daily. Take with or immediately following a meal.  In am.   Yes [provider]  nystatin (MYCOSTATIN) 100000 UNIT/ML suspension Take 5 mLs by mouth 4 (four) times daily as needed (for trush).  11/25/16  Yes [provider]  omeprazole (PRILOSEC) 40 MG capsule  Take 40 mg by mouth daily.    Yes [provider]  oxyCODONE (ROXICODONE) 15 MG immediate release tablet Take 15 mg by mouth every 4 (four) hours.    Yes [provider]  pramipexole (MIRAPEX) 0.5 MG tablet Take 1 mg by mouth at bedtime.    Yes [provider]  promethazine (PHENERGAN) 25 MG tablet Take 25 mg by mouth every 6 (six) hours as needed for nausea or vomiting.   Yes [provider]  theophylline (THEO-24) 400 MG 24 hr capsule Take 400 mg by mouth daily. In am.   Yes [provider]  tiotropium (SPIRIVA) 18 MCG inhalation capsule Place 18 mcg into inhaler and inhale daily. 2 puffs daily.   Yes [provider]  triamcinolone cream (KENALOG) 0.1 % Apply  1 application topically 2 (two) times daily as needed (for rash).   Yes [provider]  valACYclovir (VALTREX) 500 MG tablet Take 500 mg by mouth 2 (two) times daily as needed (fever blisters).  05/07/17  Yes [provider]  Vitamin D, Ergocalciferol, (DRISDOL) 50000 UNITS CAPS capsule Take 50,000 Units by mouth every 14 (fourteen) days.   Yes [provider]      VITAL SIGNS:  Blood pressure 97/71, pulse (!) 116, temperature (!) 97.5 F (36.4 C), resp. rate 15, height 4\' 11"  (1.499 m), weight 180 lb (81.6 kg), SpO2 92 %.  PHYSICAL EXAMINATION:  Physical Exam  GENERAL:  77 y.o.-year-old patient lying in the bed with no acute distress.  Obesity. EYES: Pupils equal, round, reactive to light and accommodation. No scleral icterus. Extraocular muscles intact.  HEENT: Head atraumatic, normocephalic. Oropharynx and nasopharynx clear.  NECK:  Supple, no jugular venous distention. No thyroid enlargement, no tenderness.  LUNGS: Normal breath sounds bilaterally, no wheezing, rales,rhonchi or crepitation. No use of accessory muscles of respiration.  CARDIOVASCULAR: S1, S2 normal. No murmurs, rubs, or gallops.  ABDOMEN: Soft, nontender, nondistended. Bowel sounds  present. No organomegaly or mass.  EXTREMITIES: No pedal edema, cyanosis, or clubbing.  NEUROLOGIC: Cranial nerves II through XII are intact. Muscle strength 3-4/5 in all extremities. Sensation intact. Gait not checked.  PSYCHIATRIC: The patient is alert and oriented x 3.  SKIN: No obvious rash, lesion, or ulcer.   LABORATORY PANEL:   CBC Recent Labs  Lab 06/20/17 0837  WBC 9.0  HGB 12.6  HCT 39.1  PLT 204   ------------------------------------------------------------------------------------------------------------------  Chemistries  Recent Labs  Lab 06/20/17 0837  NA 137  K 4.2  CL 94*  CO2 30  GLUCOSE 109*  BUN 76*  CREATININE 2.11*  CALCIUM 9.3  AST 24  ALT 15  ALKPHOS 140*  BILITOT 0.5   ------------------------------------------------------------------------------------------------------------------  Cardiac Enzymes Recent Labs  Lab 06/20/17 0837  TROPONINI <0.03   ------------------------------------------------------------------------------------------------------------------  RADIOLOGY:  Ct Head Wo Contrast  Result Date: 06/20/2017 CLINICAL DATA:  Onset of difficulty walking and left hand numbness today. EXAM: CT HEAD WITHOUT CONTRAST TECHNIQUE: Contiguous axial images were obtained from the base of the skull through the vertex without intravenous contrast. COMPARISON:  Brain MRI 01/24/2016. FINDINGS: Brain: No evidence of acute infarction, hemorrhage, hydrocephalus, extra-axial collection or mass lesion/mass effect. Cortical atrophy and chronic microvascular ischemic change are identified. Vascular: Atherosclerosis noted. Skull: Intact. Sinuses/Orbits: Negative. Other: None. IMPRESSION: No acute abnormality. Atrophy and chronic microvascular ischemic change. Atherosclerosis. Electronically Signed   By: Drusilla Kanner M.D.   On: 06/20/2017 09:15      IMPRESSION AND PLAN:   Acute renal failure due to dehydration. The patient will be admitted to  medical floor. Start normal saline IV and follow-up BMP.  Hold losartan and Lasix.  Left arm weakness and numbness.  Rule out CVA. Start aspirin and Lipitor. MRI/MRA of the brain, echocardiogram and carotid duplex.  Neuro check.  Neurology consult as needed.  PT and OT evaluation.  Hypertension.  Hold hypertension medication due to low blood pressure.  Chronic respiratory failure due to COPD.  Continue home oxygen 2 L, albuterol as needed, continue home nebulizer.  Diabetes.  Start sliding scale.  Tobacco abuse.  Smoking cessation was counseled for 4 minutes, nicotine patch.  All the records are reviewed and case discussed with ED provider. Management plans discussed with the patient, her husband and they are in agreement.  CODE STATUS: DNR  TOTAL TIME TAKING CARE OF THIS PATIENT: 53 minutes.    Shaune Pollack M.D on 06/20/2017 at 10:24 AM  Between 7am to 6pm - Pager - [phone]  After 6pm go to [email or web address] - Scientist, research (life sciences) [place] [facility]  Office  [phone]  CC: Primary care physician; Mickey Farber, MD   Note: This dictation was prepared with Dragon dictation along with smaller phrase technology. Any transcriptional errors that result from this process are unin

## 2017-06-20 NOTE — Progress Notes (Signed)
PT Cancellation Note  Patient Details Name: Brenda Glenn MRN: [id number] DOB: 1940/12/20   Cancelled Treatment:    Reason Eval/Treat Not Completed: Other (comment).  Prepared to see pt for PT evaluation; however RN reports that pt very lethargic after just returning from MRI with pt receiving ativan before MRI.  Will hold PT until pt is able to participate.    Encarnacion ChuAshley Alexiya Franqui PT, DPT 06/20/2017, 3:57 PM

## 2017-06-20 NOTE — ED Notes (Signed)
Dr. Chen at bedside.

## 2017-06-21 DIAGNOSIS — N289 Disorder of kidney and ureter, unspecified: Secondary | ICD-10-CM | POA: Diagnosis not present

## 2017-06-21 DIAGNOSIS — N179 Acute kidney failure, unspecified: Secondary | ICD-10-CM | POA: Diagnosis not present

## 2017-06-21 LAB — BASIC METABOLIC PANEL
Anion gap: 7 (ref 5–15)
BUN: 66 mg/dL — AB (ref 6–20)
CALCIUM: 8.5 mg/dL — AB (ref 8.9–10.3)
CO2: 29 mmol/L (ref 22–32)
CREATININE: 1.65 mg/dL — AB (ref 0.44–1.00)
Chloride: 104 mmol/L (ref 101–111)
GFR calc Af Amer: 34 mL/min — ABNORMAL LOW (ref >60)
GFR, EST NON AFRICAN AMERICAN: 29 mL/min — AB (ref >60)
Glucose, Bld: 148 mg/dL — ABNORMAL HIGH (ref 65–99)
Potassium: 4.3 mmol/L (ref 3.5–5.1)
SODIUM: 140 mmol/L (ref 135–145)

## 2017-06-21 LAB — LIPID PANEL
Cholesterol: 101 mg/dL (ref 0–200)
HDL: 52 mg/dL (ref >40)
LDL CALC: 38 mg/dL (ref 0–99)
Total CHOL/HDL Ratio: 1.9 RATIO
Triglycerides: 55 mg/dL (ref <150)
VLDL: 11 mg/dL (ref 0–40)

## 2017-06-21 LAB — GLUCOSE, CAPILLARY
GLUCOSE-CAPILLARY: 91 mg/dL (ref 65–99)
Glucose-Capillary: 103 mg/dL — ABNORMAL HIGH (ref 65–99)

## 2017-06-21 LAB — HEMOGLOBIN A1C
Hgb A1c MFr Bld: 6 % — ABNORMAL HIGH (ref 4.8–5.6)
Mean Plasma Glucose: 125.5 mg/dL

## 2017-06-21 MED ORDER — NICOTINE 21 MG/24HR TD PT24: 21.0000 mg | MEDICATED_PATCH | Freq: Every day | TRANSDERMAL | 0 refills | Status: DC

## 2017-06-21 NOTE — Progress Notes (Signed)
Chart reviewed. Planned for discharge today. No apparent speech language or swallowing problems. Visited Pt during her meal. Speech clear and appropriate. Stroke was ruled out per MD notes. NO ST eval indicated at this time, Pt agreed.

## 2017-06-21 NOTE — Discharge Summary (Signed)
[employer] [facility] [place] [facility]   PATIENT NAME: Brenda LewJamie Glenn    MR#:  [id number]  DATE OF BIRTH:  1940/04/29  DATE OF ADMISSION:  06/20/2017 ADMITTING PHYSICIAN: Shaune PollackQing Chen, MD  DATE OF DISCHARGE: 06/21/2017  PRIMARY CARE PHYSICIAN: Mickey Farberhies, David, MD    ADMISSION DIAGNOSIS:  Acute renal insufficiency [N28.9] Weakness of left upper extremity [R29.898]  DISCHARGE DIAGNOSIS:  Active Problems:   ARF (acute renal failure) (HCC)   SECONDARY DIAGNOSIS:   Past Medical History:  Diagnosis Date  . Arthritis    all over  . Back pain   . COPD (chronic obstructive pulmonary disease) (HCC)    O2 use continuosly at 2 liters/ some  asthma symptoms  . Diabetes mellitus without complication (HCC)    diet controlled  . GERD (gastroesophageal reflux disease)   . Heart murmur    lifelong  . Hypercholesterolemia   . Hypertension    controlled on meds  . Pneumonia 2015   [facility] hospitalized  . Renal insufficiency    early stage kidney failure  . Shortness of breath dyspnea   . Sleep apnea    2nd sleep study said pt does not have sleep apnea  . Swelling    of feet and legs    HOSPITAL COURSE:   77 year old female with past medical history of diabetes, COPD, chronic back pain, hypertension, hyperlipidemia, obstructive sleep apnea who presented to the hospital due to left upper extremity numbness and weakness and also noted to be in acute kidney injury.  1.  Left upper extremity numbness/weakness-initially on admission patient's symptoms were suspicious for a stroke.  She underwent an extensive workup including CT head, MRI of the brain, MRA of the brain which were all negative for any acute pathology.  Patient's carotid duplex showed no hemodynamically significant carotid artery stenosis. -Stroke has now been ruled out.  Patient has had a MRI of her cervical spine earlier this month which showed cervical spinal stenosis which is likely the cause of patient's symptoms.   Patient was advised to follow-up with her neurologist/neurosurgeon as an outpatient. -Patient was seen by physical therapy and the recommended home health services which is being arranged for her prior to discharge.   2.  Acute kidney injury-secondary to dehydration and poor p.o. intake.  Patient's diuretics were held, she was given some IV fluids.  Her creatinine has improved and is close to baseline now.  3. COPD - pt. Will cont. Advair, Spiriva.  - No acute exacerbation while in the hospital.   4.  Gout-continue allopurinol.  5. Hx of HTN - pt. Will cont. Felodipine, Toprol, Losartan  6. Tobacco abuse - pt. Was given nicotine patch prescription.   7. Hx of Restless Leg syndrome - pt. Will cont. Mirapex.   8. Hx of Neuropathy - pt. Will cont. Neurontin.   DISCHARGE CONDITIONS:   Stable.   CONSULTS OBTAINED:    DRUG ALLERGIES:   Allergies  Allergen Reactions  . Tequin [Gatifloxacin] Shortness Of Breath  . Keflex [Cephalexin] Other (See Comments)    Unknown  . Lisinopril Other (See Comments)    Unknown    DISCHARGE MEDICATIONS:   Allergies as of 06/21/2017      Reactions   Tequin [gatifloxacin] Shortness Of Breath   Keflex [cephalexin] Other (See Comments)   Unknown   Lisinopril Other (See Comments)   Unknown      Medication List    TAKE these medications   albuterol 108 (90 Base) MCG/ACT  inhaler Commonly known as:  PROVENTIL HFA;VENTOLIN HFA Inhale 2 puffs into the lungs every 6 (six) hours as needed for wheezing or shortness of breath.   allopurinol 100 MG tablet Commonly known as:  ZYLOPRIM Take 200 mg by mouth daily. In am.   diclofenac 1.3 % Ptch Commonly known as:  FLECTOR Place 1 patch onto the skin daily as needed (for pain).   ezetimibe 10 MG tablet Commonly known as:  ZETIA Take 10 mg by mouth at bedtime.   felodipine 10 MG 24 hr tablet Commonly known as:  PLENDIL Take 10 mg by mouth at bedtime.   fluticasone 50 MCG/ACT nasal  spray Commonly known as:  FLONASE Place 2 sprays into both nostrils daily as needed for allergies.   fluticasone-salmeterol 115-21 MCG/ACT inhaler Commonly known as:  ADVAIR HFA Inhale 2 puffs into the lungs 2 (two) times daily.   furosemide 20 MG tablet Commonly known as:  LASIX Take 40 mg by mouth daily.   gabapentin 100 MG capsule Commonly known as:  NEURONTIN Take 100 mg by mouth 3 (three) times daily.   losartan 100 MG tablet Commonly known as:  COZAAR Take 100 mg by mouth daily. In am   metoprolol succinate 100 MG 24 hr tablet Commonly known as:  TOPROL-XL Take 100 mg by mouth daily. Take with or immediately following a meal.  In am.   MUCINEX 600 MG 12 hr tablet Generic drug:  guaiFENesin Take 600 mg by mouth every 12 (twelve) hours as needed for cough.   nicotine 21 mg/24hr patch Commonly known as:  NICODERM CQ - dosed in mg/24 hours Place 1 patch (21 mg total) onto the skin daily.   nystatin 100000 UNIT/ML suspension Commonly known as:  MYCOSTATIN Take 5 mLs by mouth 4 (four) times daily as needed (for trush).   omeprazole 40 MG capsule Commonly known as:  PRILOSEC Take 40 mg by mouth daily.   oxyCODONE 15 MG immediate release tablet Commonly known as:  ROXICODONE Take 15 mg by mouth every 4 (four) hours.   pramipexole 0.5 MG tablet Commonly known as:  MIRAPEX Take 1 mg by mouth at bedtime.   promethazine 25 MG tablet Commonly known as:  PHENERGAN Take 25 mg by mouth every 6 (six) hours as needed for nausea or vomiting.   theophylline 400 MG 24 hr capsule Commonly known as:  THEO-24 Take 400 mg by mouth daily. In am.   tiotropium 18 MCG inhalation capsule Commonly known as:  SPIRIVA Place 18 mcg into inhaler and inhale daily. 2 puffs daily.   triamcinolone cream 0.1 % Commonly known as:  KENALOG Apply 1 application topically 2 (two) times daily as needed (for rash).   valACYclovir 500 MG tablet Commonly known as:  VALTREX Take 500 mg by  mouth 2 (two) times daily as needed (fever blisters).   Vitamin D (Ergocalciferol) 50000 units Caps capsule Commonly known as:  DRISDOL Take 50,000 Units by mouth every 14 (fourteen) days.            Durable Medical Equipment  (From admission, onward)        Start     Ordered   06/21/17 1203  For home use only DME Walker rolling  Once    Question:  Patient needs a walker to treat with the following condition  Answer:  Weakness   06/21/17 1202        DISCHARGE INSTRUCTIONS:   DIET:  Cardiac diet and Diabetic diet  DISCHARGE CONDITION:  Stable  ACTIVITY:  Activity as tolerated  OXYGEN:  Home Oxygen: Yes.     Oxygen Delivery: 2 liters/min via Patient connected to nasal cannula oxygen  DISCHARGE LOCATION:  Home with Home Health Services.    If you experience worsening of your admission symptoms, develop shortness of breath, life threatening emergency, suicidal or homicidal thoughts you must seek medical attention immediately by calling 911 or calling your MD immediately  if symptoms less severe.  You Must read complete instructions/literature along with all the possible adverse reactions/side effects for all the Medicines you take and that have been prescribed to you. Take any new Medicines after you have completely understood and accpet all the possible adverse reactions/side effects.   Please note  You were cared for by a hospitalist during your hospital stay. If you have any questions about your discharge medications or the care you received while you were in the hospital after you are discharged, you can call the unit and asked to speak with the hospitalist on call if the hospitalist that took care of you is not available. Once you are discharged, your primary care physician will handle any further medical issues. Please note that NO REFILLS for any discharge medications will be authorized once you are discharged, as it is imperative that you return to your primary  care physician (or establish a relationship with a primary care physician if you do not have one) for your aftercare needs so that they can reassess your need for medications and monitor your lab values.     Today   Pt. Has left upper ext. Numbness, tingling which has improved.  Patient's renal function improved since yesterday with IV fluids.  Will discharge patient home today with home health services.  VITAL SIGNS:  Blood pressure 109/60, pulse 82, temperature 98.2 F (36.8 C), temperature source Oral, resp. rate (!) 22, height 4\' 11"  (1.499 m), weight 82.5 kg (181 lb 14.1 oz), SpO2 97 %.  I/O:    Intake/Output Summary (Last 24 hours) at 06/21/2017 1210 Last data filed at 06/21/2017 0951 Gross per 24 hour  Intake 1108 ml  Output 1500 ml  Net -392 ml    PHYSICAL EXAMINATION:   GENERAL:  76 y.o.-year-old obese patient lying in the bed with no acute distress.  EYES: Pupils equal, round, reactive to light and accommodation. No scleral icterus. Extraocular muscles intact.  HEENT: Head atraumatic, normocephalic. Oropharynx and nasopharynx clear.  NECK:  Supple, no jugular venous distention. No thyroid enlargement, no tenderness.  LUNGS: Normal breath sounds bilaterally, no wheezing, rales,rhonchi. No use of accessory muscles of respiration.  CARDIOVASCULAR: S1, S2 normal. No murmurs, rubs, or gallops.  ABDOMEN: Soft, non-tender, non-distended. Bowel sounds present. No organomegaly or mass.  EXTREMITIES: No pedal edema, cyanosis, or clubbing.  NEUROLOGIC: Cranial nerves II through XII are intact. No focal motor or sensory defecits b/l.  PSYCHIATRIC: The patient is alert and oriented x 3. .  SKIN: No obvious rash, lesion, or ulcer.   DATA REVIEW:   CBC Recent Labs  Lab 06/20/17 0837  WBC 9.0  HGB 12.6  HCT 39.1  PLT 204    Chemistries  Recent Labs  Lab 06/20/17 0837 06/21/17 0836  NA 137 140  K 4.2 4.3  CL 94* 104  CO2 30 29  GLUCOSE 109* 148*  BUN 76* 66*   CREATININE 2.11* 1.65*  CALCIUM 9.3 8.5*  AST 24  --   ALT 15  --   ALKPHOS 140*  --  BILITOT 0.5  --     Cardiac Enzymes Recent Labs  Lab 06/20/17 0837  TROPONINI <0.03    Microbiology Results  Results for orders placed or performed during the hospital encounter of 06/20/17  MRSA PCR Screening     Status: None   Collection Time: 06/20/17  4:15 PM  Result Value Ref Range Status   MRSA by PCR NEGATIVE NEGATIVE Final    Comment:        The GeneXpert MRSA Assay (FDA approved for NASAL specimens only), is one component of a comprehensive MRSA colonization surveillance program. It is not intended to diagnose MRSA infection nor to guide or monitor treatment for MRSA infections. Performed at [facility], [address] [place], [place] [address]     RADIOLOGY:  Ct Head Wo Contrast  Result Date: 06/20/2017 CLINICAL DATA:  Onset of difficulty walking and left hand numbness today. EXAM: CT HEAD WITHOUT CONTRAST TECHNIQUE: Contiguous axial images were obtained from the base of the skull through the vertex without intravenous contrast. COMPARISON:  Brain MRI 01/24/2016. FINDINGS: Brain: No evidence of acute infarction, hemorrhage, hydrocephalus, extra-axial collection or mass lesion/mass effect. Cortical atrophy and chronic microvascular ischemic change are identified. Vascular: Atherosclerosis noted. Skull: Intact. Sinuses/Orbits: Negative. Other: None. IMPRESSION: No acute abnormality. Atrophy and chronic microvascular ischemic change. Atherosclerosis. Electronically Signed   By: Drusilla Kanner M.D.   On: 06/20/2017 09:15   Mr Brain Wo Contrast  Result Date: 06/20/2017 CLINICAL DATA:  Left upper extremity weakness. EXAM: MRI HEAD WITHOUT CONTRAST MRA HEAD WITHOUT CONTRAST TECHNIQUE: Multiplanar, multiecho pulse sequences of the brain and surrounding structures were obtained without intravenous contrast. Angiographic images of the head were obtained using MRA technique  without contrast. COMPARISON:  Head CT 06/20/2017 and MRI 01/24/2016 FINDINGS: MRI HEAD FINDINGS Despite the patient being pre-medicated, there is moderate to severe motion artifact throughout the examination. Brain: Diffusion imaging is considered to be of diagnostic quality and is without evidence of acute infarct. The ventricles and sulci are normal. 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 No intracranial hemorrhage, mass, midline shift, or extra-axial fluid collection is identified. Vascular: Major intracranial vascular flow voids are grossly preserved. Skull and upper cervical spine: No destructive osseous lesion identified. Sinuses/Orbits: Bilateral cataract extraction. Paranasal sinuses and mastoid air cells are clear. Other: None. MRA HEAD FINDINGS The study is severely motion degraded. Signal loss in the distal V3 and proximal V4 segments is attributed to artifact. The distal V4 segments are patent to the vertebrobasilar junction. The basilar artery is patent without evidence of significant stenosis in its mid and distal portions, with motion artifact limiting assessment proximally. The PCAs are patent proximally. The internal carotid arteries are patent from skull base to carotid termini with suspected artifactual signal loss in both petrous segments and no definite high-grade stenosis more distally. The M1 segments are patent in their proximal and mid portions. The MCAs from the distal M1 level and beyond are largely obscured by motion artifact. The ACAs are grossly patent. IMPRESSION: 1. Severely motion degraded examination without acute infarct. 2. Mild chronic small vessel ischemic disease. 3. Limited MRA with grossly patent large vessels as above. Electronically Signed   By: Sebastian Ache M.D.   On: 06/20/2017 14:58   US  Carotid Bilateral (at [facility] And [facility] Only)  Result Date: 06/21/2017 CLINICAL DATA:  TIA symptoms, hypertension,, diabetes EXAM: BILATERAL CAROTID DUPLEX ULTRASOUND TECHNIQUE: Wallace Cullens scale imaging, color Doppler and duplex ultrasound were performed of bilateral carotid and vertebral arteries in the neck. COMPARISON:  None. FINDINGS: Criteria: Quantification of carotid stenosis is based on velocity parameters that correlate the residual internal carotid diameter with NASCET-based stenosis levels, using the diameter of the distal internal carotid lumen as the denominator for stenosis measurement. The following velocity measurements were obtained: RIGHT ICA: 96/26 cm/sec CCA: 85/21 cm/sec SYSTOLIC ICA/CCA RATIO:  1.1 ECA:  268 cm/sec LEFT ICA: 83/22 cm/sec CCA: 70/14 cm/sec SYSTOLIC ICA/CCA RATIO:  1.2 ECA:  80 cm/sec RIGHT CAROTID ARTERY: Minor echogenic shadowing plaque formation. No hemodynamically significant right ICA stenosis, velocity elevation, or turbulent flow. Degree of narrowing less than 50%. RIGHT VERTEBRAL ARTERY:  Antegrade LEFT CAROTID ARTERY: Similar scattered minor echogenic plaque formation. No hemodynamically significant left ICA stenosis, velocity elevation, or turbulent flow. LEFT VERTEBRAL ARTERY:  Antegrade IMPRESSION: Minor carotid atherosclerosis. No hemodynamically significant ICA stenosis. Degree of narrowing less than 50% bilaterally by ultrasound criteria. Patent antegrade vertebral flow bilaterally Electronically Signed   By: Judie Petit.  Shick M.D.   On: 06/21/2017 07:46   Mr Maxine Glenn Head/brain ZO Cm  Result Date: 06/20/2017 CLINICAL DATA:  Left upper extremity weakness. EXAM: MRI HEAD WITHOUT CONTRAST MRA HEAD WITHOUT CONTRAST TECHNIQUE: Multiplanar, multiecho pulse sequences of the brain and surrounding structures were obtained without intravenous contrast. Angiographic images of the head were obtained using MRA technique without contrast. COMPARISON:  Head CT 06/20/2017 and MRI 01/24/2016  FINDINGS: MRI HEAD FINDINGS Despite the patient being pre-medicated, there is moderate to severe motion artifact throughout the examination. Brain: Diffusion imaging is considered to be of diagnostic quality and is without evidence of acute infarct. The ventricles and sulci are normal. 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 No intracranial hemorrhage, mass, midline shift, or extra-axial fluid collection is identified. Vascular: Major intracranial vascular flow voids are grossly preserved. Skull and upper cervical spine: No destructive osseous lesion identified. Sinuses/Orbits: Bilateral cataract extraction. Paranasal sinuses and mastoid air cells are clear. Other: None. MRA HEAD FINDINGS The study is severely motion degraded. Signal loss in the distal V3 and proximal V4 segments is attributed to artifact. The distal V4 segments are patent to the vertebrobasilar junction. The basilar artery is patent without evidence of significant stenosis in its mid and distal portions, with motion artifact limiting assessment proximally. The PCAs are patent proximally. The internal carotid arteries are patent from skull base to carotid termini with suspected artifactual signal loss in both petrous segments and no definite high-grade stenosis more distally. The M1 segments are patent in their proximal and mid portions. The MCAs from the distal M1 level and beyond are largely obscured by motion artifact. The ACAs are grossly patent. IMPRESSION: 1. Severely motion degraded examination without acute infarct. 2. Mild chronic small vessel ischemic disease. 3. Limited MRA with grossly patent large vessels as above. Electronically Signed   By: Sebastian Ache M.D.   On: 06/20/2017 14:58      Management plans discussed with the patient, family and they are in  agreement.  CODE STATUS:     Code Status Orders  (From admission, onward)        Start     Ordered   06/20/17 1157  Do not attempt resuscitation (DNR)  Continuous  Question Answer Comment  In the event of cardiac or respiratory ARREST Do not call a "code blue"   In the event of cardiac or respiratory ARREST Do not perform Intubation, CPR, defibrillation or ACLS   In the event of cardiac or respiratory ARREST Use medication by any route, position, wound care, and other measures to relive pain and suffering. May use oxygen, suction and manual treatment of airway obstruction as needed for comfort.      06/20/17 1156  Advance Directive Documentation     Most Recent Value  Type of Advance Directive  Living will  Pre-existing out of facility DNR order (yellow form or pink MOST form)  -  "MOST" Form in Place?  -      TOTAL TIME TAKING CARE OF THIS PATIENT: 40 minutes.    Houston Siren M.D on 06/21/2017 at 12:10 PM  Between 7am to 6pm - Pager - [phone]  After 6pm go to [email or web address] - Scientist, research (life sciences) [facility]  Office  [phone]  CC: Primary care physician; Mickey Farber, MD

## 2017-06-21 NOTE — Evaluation (Signed)
Physical Therapy Evaluation Patient Details Name: Brenda Glenn MRN: [id number] DOB: 1940-06-13 Today's Date: 06/21/2017   History of Present Illness  Pt admitted for ARF with complaints of L arm weakness/numbness. History includes HTN, DM, COPD, CRF on home O2, and GERD. MRI negative at this time. Pt reports she still feels weak in L arm  Clinical Impression  Pt is a pleasant 77 year old female who was admitted for ARF with complaints of L UE weakness. Pt performs bed mobility, transfers, and ambulation with cga and RW. Pt demonstrates deficits with L hemibody strength/balance/mobility. At baseline, she reports she is somewhat sedentary and only ambulates inside home. She is nearing baseline level, however is appropriate for use of RW at this time due to strength/balance deficits. She also desats quickly with limited distances, all mobility performed on 2 L of O2 with sats decreasing to 80%. RN notified.  Would benefit from skilled PT to address above deficits and promote optimal return to PLOF. Recommend transition to HHPT upon discharge from acute hospitalization.       Follow Up Recommendations Home health PT    Equipment Recommendations  Rolling walker with 5" wheels    Recommendations for Other Services       Precautions / Restrictions Precautions Precautions: Fall Restrictions Weight Bearing Restrictions: No      Mobility  Bed Mobility Overal bed mobility: Needs Assistance Bed Mobility: Supine to Sit     Supine to sit: Min guard     General bed mobility comments: cues for reaching with B UE for railing. Once seated at EOB, able to sit with supervision.  Transfers Overall transfer level: Needs assistance Equipment used: Rolling walker (2 wheeled) Transfers: Sit to/from Stand Sit to Stand: Min guard         General transfer comment: safe technique with forward flexed posture noted. All mobility performed on 2L of O2. Cues to push from seated surface as she  tries to pull up on RW  Ambulation/Gait Ambulation/Gait assistance: Min guard Ambulation Distance (Feet): 45 Feet Assistive device: Rolling walker (2 wheeled) Gait Pattern/deviations: Step-through pattern     General Gait Details: ambulated first to recliner and then around room to bathroom. Safe technique with RW, however fatigues with increased distance with O2 sats decreasing to 80% with increased distance while on 2L of O2. With seated rest break improve and then decrease to 85% with ambulation back to recliner.  Stairs            Wheelchair Mobility    Modified Rankin (Stroke Patients Only)       Balance Overall balance assessment: Needs assistance Sitting-balance support: Feet supported Sitting balance-Leahy Scale: Good     Standing balance support: Bilateral upper extremity supported Standing balance-Leahy Scale: Good                               Pertinent Vitals/Pain Pain Assessment: No/denies pain    Home Living Family/patient expects to be discharged to:: Private residence Living Arrangements: Spouse/significant other Available Help at Discharge: Family Type of Home: House Home Access: Ramped entrance     Home Layout: One level Home Equipment: Cane - single point      Prior Function Level of Independence: Independent with assistive device(s)         Comments: reports she uses SPC for all mobility, not very active, short distances at baseline     Hand Dominance  Extremity/Trunk Assessment   Upper Extremity Assessment Upper Extremity Assessment: Generalized weakness(L grip 3+/5; elbow 4/5; R UE grossly 5/5)    Lower Extremity Assessment Lower Extremity Assessment: Generalized weakness(L LE grossly 4/5; R LE grossly 4+/5)       Communication   Communication: No difficulties  Cognition Arousal/Alertness: Awake/alert Behavior During Therapy: WFL for tasks assessed/performed Overall Cognitive Status: Within  Functional Limits for tasks assessed                                        General Comments      Exercises Other Exercises Other Exercises: ambulated to bathroom with cga and supervision for set up. Needs cues for safety and reaching back for [facility] over toliet. Able to perform self hygiene with supervision   Assessment/Plan    PT Assessment Patient needs continued PT services  PT Problem List Decreased strength;Decreased activity tolerance;Decreased balance;Decreased knowledge of use of DME;Cardiopulmonary status limiting activity       PT Treatment Interventions DME instruction;Therapeutic exercise;Balance training    PT Goals (Current goals can be found in the Care Plan section)  Acute Rehab PT Goals Patient Stated Goal: to go home PT Goal Formulation: With patient Time For Goal Achievement: 07/05/17 Potential to Achieve Goals: Good    Frequency Min 2X/week   Barriers to discharge        Co-evaluation               AM-PAC PT "6 Clicks" Daily Activity  Outcome Measure Difficulty turning over in bed (including adjusting bedclothes, sheets and blankets)?: Unable Difficulty moving from lying on back to sitting on the side of the bed? : Unable Difficulty sitting down on and standing up from a chair with arms (e.g., wheelchair, bedside commode, etc,.)?: Unable Help needed moving to and from a bed to chair (including a wheelchair)?: A Little Help needed walking in hospital room?: A Little Help needed climbing 3-5 steps with a railing? : A Lot 6 Click Score: 11    End of Session Equipment Utilized During Treatment: Gait belt;Oxygen Activity Tolerance: Patient limited by fatigue Patient left: in chair;with chair alarm set Nurse Communication: Mobility status PT Visit Diagnosis: Unsteadiness on feet (R26.81);Muscle weakness (generalized) (M62.81);Difficulty in walking, not elsewhere classified (R26.2)    Time: 1610-9604 PT Time Calculation (min)  (ACUTE ONLY): 31 min   Charges:   PT Evaluation $PT Eval Low Complexity: 1 Low PT Treatments $Therapeutic Activity: 8-22 mins   PT G CodesElizabeth Palau, PT, DPT [phone]   Beatriz Settles 06/21/2017, 11:03 AM

## 2017-06-21 NOTE — Progress Notes (Signed)
Brenda Glenn Graves Job  A and O x 4. VSS. Pt tolerating diet well. No complaints of pain or nausea. IV removed intact, prescriptions given. Pt and husband voiced understanding of discharge instructions with no further questions. Pt discharged via wheelchair with nurse tech. Husband has home O2 tank in the car.   Orvil FeilAbbie Rosely Fernandez MSN, RN-BC  Allergies as of 06/21/2017      Reactions   Tequin [gatifloxacin] Shortness Of Breath   Keflex [cephalexin] Other (See Comments)   Unknown   Lisinopril Other (See Comments)   Unknown      Medication List    TAKE these medications   albuterol 108 (90 Base) MCG/ACT inhaler Commonly known as:  PROVENTIL HFA;VENTOLIN HFA Inhale 2 puffs into the lungs every 6 (six) hours as needed for wheezing or shortness of breath.   allopurinol 100 MG tablet Commonly known as:  ZYLOPRIM Take 200 mg by mouth daily. In am.   diclofenac 1.3 % Ptch Commonly known as:  FLECTOR Place 1 patch onto the skin daily as needed (for pain).   ezetimibe 10 MG tablet Commonly known as:  ZETIA Take 10 mg by mouth at bedtime.   felodipine 10 MG 24 hr tablet Commonly known as:  PLENDIL Take 10 mg by mouth at bedtime.   fluticasone 50 MCG/ACT nasal spray Commonly known as:  FLONASE Place 2 sprays into both nostrils daily as needed for allergies.   fluticasone-salmeterol 115-21 MCG/ACT inhaler Commonly known as:  ADVAIR HFA Inhale 2 puffs into the lungs 2 (two) times daily.   furosemide 20 MG tablet Commonly known as:  LASIX Take 40 mg by mouth daily.   gabapentin 100 MG capsule Commonly known as:  NEURONTIN Take 100 mg by mouth 3 (three) times daily.   losartan 100 MG tablet Commonly known as:  COZAAR Take 100 mg by mouth daily. In am   metoprolol succinate 100 MG 24 hr tablet Commonly known as:  TOPROL-XL Take 100 mg by mouth daily. Take with or immediately following a meal.  In am.   MUCINEX 600 MG 12 hr tablet Generic drug:  guaiFENesin Take 600 mg by mouth  every 12 (twelve) hours as needed for cough.   nicotine 21 mg/24hr patch Commonly known as:  NICODERM CQ - dosed in mg/24 hours Place 1 patch (21 mg total) onto the skin daily.   nystatin 100000 UNIT/ML suspension Commonly known as:  MYCOSTATIN Take 5 mLs by mouth 4 (four) times daily as needed (for trush).   omeprazole 40 MG capsule Commonly known as:  PRILOSEC Take 40 mg by mouth daily.   oxyCODONE 15 MG immediate release tablet Commonly known as:  ROXICODONE Take 15 mg by mouth every 4 (four) hours.   pramipexole 0.5 MG tablet Commonly known as:  MIRAPEX Take 1 mg by mouth at bedtime.   promethazine 25 MG tablet Commonly known as:  PHENERGAN Take 25 mg by mouth every 6 (six) hours as needed for nausea or vomiting.   theophylline 400 MG 24 hr capsule Commonly known as:  THEO-24 Take 400 mg by mouth daily. In am.   tiotropium 18 MCG inhalation capsule Commonly known as:  SPIRIVA Place 18 mcg into inhaler and inhale daily. 2 puffs daily.   triamcinolone cream 0.1 % Commonly known as:  KENALOG Apply 1 application topically 2 (two) times daily as needed (for rash).   valACYclovir 500 MG tablet Commonly known as:  VALTREX Take 500 mg by mouth 2 (two) times daily as needed (  fever blisters).   Vitamin D (Ergocalciferol) 50000 units Caps capsule Commonly known as:  DRISDOL Take 50,000 Units by mouth every 14 (fourteen) days.            Durable Medical Equipment  (From admission, onward)        Start     Ordered   06/21/17 1203  For home use only DME Walker rolling  Once    Question:  Patient needs a walker to treat with the following condition  Answer:  Weakness   06/21/17 1202      Vitals:   06/21/17 0450 06/21/17 1443  BP: 109/60 (!) 115/57  Pulse: 82 96  Resp: (!) 22 14  Temp: 98.2 F (36.8 C) 97.7 F (36.5 C)  SpO2: 97% 98%

## 2017-06-21 NOTE — Progress Notes (Signed)
OT Cancellation Note  Patient Details Name: [name] NoJamie Graves Capistran MRN: [id number] DOB: [birth year]   Cancelled Treatment:    Reason Eval/Treat Not Completed: Other (comment). Order received, chart reviewed. Noted pt now with discharge order in. Spoke with RN, pt preparing for discharge currently. Not available for OT evaluation at this time.   Richrd PrimeJamie Stiller, MPH, MS, OTR/L ascom [phone] 06/21/17, 1:27 PM

## 2017-06-21 NOTE — Care Management Note (Signed)
Case Management Note  Patient Details  Name: [name] NoJamie Graves Phenix MRN: [id number] Date of Birth: 10-25-1940   Patient admitted from home with weakness,, and AKI.  Patient lives at home with wife.  PCP Thies.  Pharmacy [facility].  PT has assessed patient and recommends home health PT.  Patient agreeable to services. Has had [facility] in the past, and would like to choose a difference agency.  Patient states that she does not have an agency preference.  [facility]  RW to be delivered to room prior to discharge.   Patient states that she has a BSC, and chronic O2 through [employer]. Family to bring portable O2 for discharge.  RNCM signing.   Subjective/Objective:                    Action/Plan:   Expected Discharge Date:  06/21/17               Expected Discharge Plan:  Home w Home Health Services  In-House Referral:     Discharge planning Services  CM Consult  Post Acute Care Choice:  Durable Medical Equipment, Home Health Choice offered to:  Patient  DME Arranged:  Walker rolling DME Agency:  [facility]  HH Arranged:  RN, PT, Nurse's Aide HH Agency:  [facility]  Status of Service:  Completed, signed off  If discussed at Long Length of Stay Meetings, dates discussed:    Additional CommentsChapman Fitch:  Secilia Apps T, RN 06/21/2017, 2:04 PM

## 2017-06-21 NOTE — Progress Notes (Signed)
Per Dr. Cherlynn KaiserSainani okay to discontinue ECHO at this time.

## 2017-06-25 ENCOUNTER — Emergency Department: Payer: Medicare Other

## 2017-06-25 ENCOUNTER — Other Ambulatory Visit: Payer: Self-pay

## 2017-06-25 ENCOUNTER — Encounter: Payer: Self-pay | Admitting: Emergency Medicine

## 2017-06-25 ENCOUNTER — Emergency Department
Admission: EM | Admit: 2017-06-25 | Discharge: 2017-06-25 | Disposition: A | Discharge status: Home / Self Care | Payer: Medicare Other | Attending: Emergency Medicine | Admitting: Emergency Medicine

## 2017-06-25 DIAGNOSIS — Y939 Activity, unspecified: Secondary | ICD-10-CM | POA: Insufficient documentation

## 2017-06-25 DIAGNOSIS — I10 Essential (primary) hypertension: Secondary | ICD-10-CM | POA: Insufficient documentation

## 2017-06-25 DIAGNOSIS — Z96651 Presence of right artificial knee joint: Secondary | ICD-10-CM | POA: Diagnosis not present

## 2017-06-25 DIAGNOSIS — R52 Pain, unspecified: Secondary | ICD-10-CM

## 2017-06-25 DIAGNOSIS — E119 Type 2 diabetes mellitus without complications: Secondary | ICD-10-CM | POA: Insufficient documentation

## 2017-06-25 DIAGNOSIS — W06XXXA Fall from bed, initial encounter: Secondary | ICD-10-CM | POA: Diagnosis not present

## 2017-06-25 DIAGNOSIS — Y929 Unspecified place or not applicable: Secondary | ICD-10-CM | POA: Insufficient documentation

## 2017-06-25 DIAGNOSIS — Y999 Unspecified external cause status: Secondary | ICD-10-CM | POA: Insufficient documentation

## 2017-06-25 DIAGNOSIS — S0990XA Unspecified injury of head, initial encounter: Secondary | ICD-10-CM | POA: Diagnosis not present

## 2017-06-25 DIAGNOSIS — J449 Chronic obstructive pulmonary disease, unspecified: Secondary | ICD-10-CM | POA: Insufficient documentation

## 2017-06-25 DIAGNOSIS — F17210 Nicotine dependence, cigarettes, uncomplicated: Secondary | ICD-10-CM | POA: Insufficient documentation

## 2017-06-25 DIAGNOSIS — S0003XA Contusion of scalp, initial encounter: Secondary | ICD-10-CM | POA: Insufficient documentation

## 2017-06-25 DIAGNOSIS — W19XXXA Unspecified fall, initial encounter: Secondary | ICD-10-CM

## 2017-06-25 MED ORDER — HYDROMORPHONE HCL 1 MG/ML IJ SOLN
1.0000 mg | Freq: Once | INTRAMUSCULAR | Status: AC
  Administered 2017-06-25: 1 mg via INTRAMUSCULAR
  Filled 2017-06-25: qty 1

## 2017-06-25 NOTE — ED Notes (Signed)
Patient transported to X-ray 

## 2017-06-25 NOTE — ED Provider Notes (Signed)
[facility] Emergency Department Provider Note       Time seen: ----------------------------------------- 8:36 AM on 06/25/2017 -----------------------------------------   I have reviewed the triage vital signs and the nursing notes.  HISTORY   Chief Complaint Fall    HPI Marcos EkeJamie Graves Elita Booneash is a 77 y.o. female with a history of arthritis, COPD, diabetes, GERD, hyperlipidemia, hypertension, pneumonia who presents to the ED for a fall that occurred 1 hour prior to arrival.  She fell out of bed this morning, had an ecchymosis noted to the left eye and forehead.  Patient does have chronic pain as well and has been dealing with right shoulder pain.  She was recently admitted in the hospital for COPD exacerbation.    Past Medical History:  Diagnosis Date  . Arthritis    all over  . Back pain   . COPD (chronic obstructive pulmonary disease) (HCC)    O2 use continuosly at 2 liters/ some  asthma symptoms  . Diabetes mellitus without complication (HCC)    diet controlled  . GERD (gastroesophageal reflux disease)   . Heart murmur    lifelong  . Hypercholesterolemia   . Hypertension    controlled on meds  . Pneumonia 2015   [facility]  . Renal insufficiency    early stage kidney failure  . Shortness of breath dyspnea   . Sleep apnea    2nd sleep study said pt does not have sleep apnea  . Swelling    of feet and legs    Patient Active Problem List   Diagnosis Date Noted  . ARF (acute renal failure) (HCC) 06/20/2017  . 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 Lymphedema 01/06/2016  . Chronic venous insufficiency 01/06/2016  . Pain in limb 01/06/2016  . COPD (chronic obstructive pulmonary disease) (HCC) 01/06/2016  . Abnormality of gait 01/06/2016    Past Surgical History:  Procedure Laterality Date  . ABDOMINAL HYSTERECTOMY  1988  . BACK SURGERY  1978 and 1995   residual nerve damage legs-weakness  . BLADDER SURGERY  2017  .  CATARACT EXTRACTION Right   . CATARACT EXTRACTION W/PHACO Left 09/05/2014   Procedure: CATARACT EXTRACTION PHACO AND INTRAOCULAR LENS PLACEMENT (IOC);  Surgeon: Lockie Molahadwick Brasington, MD;  [facility];  Service: Ophthalmology;  Laterality: Left;  DIABETIC  . CHOLECYSTECTOMY  2011  . JOINT REPLACEMENT Right 2009  . REPLACEMENT TOTAL KNEE Right   . REVERSE SHOULDER ARTHROPLASTY Right 01/08/2017   Procedure: REVERSE SHOULDER ARTHROPLASTY;  Surgeon: Signa KellPatel, Sunny, MD;  Location: [facility] ORS;  Service: Orthopedics;  Laterality: Right;    Allergies Tequin [gatifloxacin]; Keflex [cephalexin]; and Lisinopril  Social History Social History   Tobacco Use  . Smoking status: Current Every Day Smoker    Packs/day: 0.25    Years: 50.00    Pack years: 12.50    Types: Cigarettes  . Smokeless tobacco: Never Used  Substance Use Topics  . Alcohol use: Yes    Alcohol/week: 1.8 oz    Types: 3 Glasses of wine per week    Comment: rarely  . Drug use: No    Review of Systems Constitutional: Negative for fever. Cardiovascular: Positive right-sided chest wall pain Respiratory: Negative for shortness of breath. Gastrointestinal: Negative for abdominal pain, vomiting and diarrhea. Musculoskeletal: Positive for back pain, right arm pain Skin: Positive for facial ecchymosis Neurological: Positive for headache  All systems negative/normal/unremarkable except as stated in the HPI  ____________________________________________   PHYSICAL EXAM:  VITAL SIGNS: ED  Triage Vitals  Enc Vitals Group     BP 06/25/17 0831 124/66     Pulse Rate 06/25/17 0831 94     Resp --      Temp --      Temp Source 06/25/17 0831 Oral     SpO2 06/25/17 0831 (!) 86 %     Weight 06/25/17 0832 181 lb (82.1 kg)     Height 06/25/17 0832 4\' 11"  (1.499 m)     Head Circumference --      Peak Flow --      Pain Score 06/25/17 0831 7     Pain Loc --      Pain Edu? --      Excl. in GC? --    Constitutional:  Alert, no distress Eyes: Conjunctivae are normal. Normal extraocular movements. ENT   Head: Normocephalic, right periorbital ecchymosis, central frontal scalp contusion   Nose: No congestion/rhinnorhea.   Mouth/Throat: Mucous membranes are moist.   Neck: No stridor. Cardiovascular: Normal rate, regular rhythm. No murmurs, rubs, or gallops. Respiratory: Normal respiratory effort without tachypnea nor retractions. Breath sounds are clear and equal bilaterally. No wheezes/rales/rhonchi. Gastrointestinal: Soft and nontender. Normal bowel sounds Musculoskeletal: Limited range of motion of the right arm, tenderness noted to the right shoulder Neurologic:  Normal speech and language. No gross focal neurologic deficits are appreciated.  Skin: Right periorbital and frontal scalp hematoma Psychiatric: Depressed mood and affect ____________________________________________  ED COURSE:  As part of my medical decision making, I reviewed the following data within the electronic MEDICAL RECORD NUMBER History obtained from family if available, nursing notes, old chart and ekg, as well as notes from prior ED visits. Patient presented for a fall, we will assess with imaging as indicated at this time.   Procedures ____________________________________________   RADIOLOGY Images were viewed by me  CT head, right shoulder x-ray, rib x-rays, back x-rays Do not reveal any acute process ____________________________________________  DIFFERENTIAL DIAGNOSIS   Fall, contusion, subdural hematoma, fracture  FINAL ASSESSMENT AND PLAN  Fall, head injury, chronic pain   Plan: The patient had presented for fall and head injury. Patient's imaging was negative for any acute abnormality.  Patient achieved pain control here with Dilaudid but does appear to be in chronic pain.  I would advise that she taper off of her chronic pain medication.  She and family are comfortable with her going home.   Ulice Dash, MD   Note: This note was generated in part or whole with voice recognition software. Voice recognition is usually quite accurate but there are transcription errors that can and very often do occur. I apologize for any typographical errors that were not detected and corrected.     Emily Filbert, MD 06/25/17 1016

## 2017-06-25 NOTE — ED Triage Notes (Signed)
Brenda Glenn out of bed this morning.  ecchymosis to left eye and forehead.  Brenda Glenn about 1 hour PTA

## 2017-06-26 ENCOUNTER — Emergency Department: Payer: Medicare Other

## 2017-06-26 ENCOUNTER — Emergency Department
Admission: EM | Admit: 2017-06-26 | Discharge: 2017-06-26 | Disposition: A | Discharge status: Home / Self Care | Payer: Medicare Other | Attending: Emergency Medicine | Admitting: Emergency Medicine

## 2017-06-26 DIAGNOSIS — I10 Essential (primary) hypertension: Secondary | ICD-10-CM | POA: Diagnosis not present

## 2017-06-26 DIAGNOSIS — E119 Type 2 diabetes mellitus without complications: Secondary | ICD-10-CM | POA: Diagnosis not present

## 2017-06-26 DIAGNOSIS — E7800 Pure hypercholesterolemia, unspecified: Secondary | ICD-10-CM | POA: Insufficient documentation

## 2017-06-26 DIAGNOSIS — J449 Chronic obstructive pulmonary disease, unspecified: Secondary | ICD-10-CM | POA: Insufficient documentation

## 2017-06-26 DIAGNOSIS — S060X9D Concussion with loss of consciousness of unspecified duration, subsequent encounter: Secondary | ICD-10-CM | POA: Diagnosis not present

## 2017-06-26 DIAGNOSIS — S0990XD Unspecified injury of head, subsequent encounter: Secondary | ICD-10-CM | POA: Diagnosis present

## 2017-06-26 DIAGNOSIS — W19XXXD Unspecified fall, subsequent encounter: Secondary | ICD-10-CM | POA: Diagnosis not present

## 2017-06-26 DIAGNOSIS — F17210 Nicotine dependence, cigarettes, uncomplicated: Secondary | ICD-10-CM | POA: Diagnosis not present

## 2017-06-26 DIAGNOSIS — Z79899 Other long term (current) drug therapy: Secondary | ICD-10-CM | POA: Diagnosis not present

## 2017-06-26 LAB — PROTIME-INR
INR: 1
Prothrombin Time: 13.1 seconds (ref 11.4–15.2)

## 2017-06-26 LAB — CBC WITH DIFFERENTIAL/PLATELET
Basophils Absolute: 0 10*3/uL (ref 0–0.1)
Basophils Relative: 0 %
Eosinophils Absolute: 0.1 10*3/uL (ref 0–0.7)
Eosinophils Relative: 1 %
HCT: 35.5 % (ref 35.0–47.0)
Hemoglobin: 11.6 g/dL — ABNORMAL LOW (ref 12.0–16.0)
LYMPHS ABS: 1.1 10*3/uL (ref 1.0–3.6)
LYMPHS PCT: 15 %
MCH: 31.2 pg (ref 26.0–34.0)
MCHC: 32.6 g/dL (ref 32.0–36.0)
MCV: 95.9 fL (ref 80.0–100.0)
MONO ABS: 0.7 10*3/uL (ref 0.2–0.9)
MONOS PCT: 10 %
NEUTROS ABS: 5.3 10*3/uL (ref 1.4–6.5)
Neutrophils Relative %: 74 %
Platelets: 186 10*3/uL (ref 150–440)
RBC: 3.71 MIL/uL — ABNORMAL LOW (ref 3.80–5.20)
RDW: 17.9 % — AB (ref 11.5–14.5)
WBC: 7.3 10*3/uL (ref 3.6–11.0)

## 2017-06-26 LAB — COMPREHENSIVE METABOLIC PANEL
ALBUMIN: 2.7 g/dL — AB (ref 3.5–5.0)
ALT: 14 U/L (ref 14–54)
AST: 22 U/L (ref 15–41)
Alkaline Phosphatase: 124 U/L (ref 38–126)
Anion gap: 8 (ref 5–15)
BUN: 46 mg/dL — AB (ref 6–20)
CO2: 29 mmol/L (ref 22–32)
Calcium: 8.4 mg/dL — ABNORMAL LOW (ref 8.9–10.3)
Chloride: 100 mmol/L — ABNORMAL LOW (ref 101–111)
Creatinine, Ser: 1.76 mg/dL — ABNORMAL HIGH (ref 0.44–1.00)
GFR calc Af Amer: 31 mL/min — ABNORMAL LOW (ref >60)
GFR calc non Af Amer: 27 mL/min — ABNORMAL LOW (ref >60)
GLUCOSE: 88 mg/dL (ref 65–99)
Potassium: 4.7 mmol/L (ref 3.5–5.1)
Sodium: 137 mmol/L (ref 135–145)
TOTAL PROTEIN: 5.5 g/dL — AB (ref 6.5–8.1)
Total Bilirubin: 0.5 mg/dL (ref 0.3–1.2)

## 2017-06-26 LAB — URINALYSIS, COMPLETE (UACMP) WITH MICROSCOPIC
BACTERIA UA: NONE SEEN
Bilirubin Urine: NEGATIVE
Glucose, UA: NEGATIVE mg/dL
KETONES UR: NEGATIVE mg/dL
Nitrite: NEGATIVE
Protein, ur: NEGATIVE mg/dL
SPECIFIC GRAVITY, URINE: 1.012 (ref 1.005–1.030)
pH: 5 (ref 5.0–8.0)

## 2017-06-26 LAB — URINE DRUG SCREEN, QUALITATIVE (ARMC ONLY)
Amphetamines, Ur Screen: NOT DETECTED
Barbiturates, Ur Screen: NOT DETECTED
Benzodiazepine, Ur Scrn: POSITIVE — AB
CANNABINOID 50 NG, UR ~~LOC~~: NOT DETECTED
COCAINE METABOLITE, UR ~~LOC~~: NOT DETECTED
MDMA (Ecstasy)Ur Screen: NOT DETECTED
Methadone Scn, Ur: NOT DETECTED
OPIATE, UR SCREEN: POSITIVE — AB
PHENCYCLIDINE (PCP) UR S: NOT DETECTED
Tricyclic, Ur Screen: NOT DETECTED

## 2017-06-26 LAB — TROPONIN I: Troponin I: <0.03 ng/mL (ref <0.03)

## 2017-06-26 LAB — ETHANOL: Alcohol, Ethyl (B): <10 mg/dL (ref <10)

## 2017-06-26 NOTE — Discharge Instructions (Signed)
Fortunately today your head CT and your blood work were reassuring.  It is normal for concussion to last a full 2 to 76 weeks.  Please follow-up with your primary care physician this coming Thursday as scheduled and return to the emergency department sooner for any concerns whatsoever.  It was a pleasure to take care of you today, and thank you for coming to our emergency department.  If you have any questions or concerns before leaving please ask the nurse to grab me and I'm more than happy to go through your aftercare instructions again.  If you were prescribed any opioid pain medication today such as Norco, Vicodin, Percocet, morphine, hydrocodone, or oxycodone please make sure you do not drive when you are taking this medication as it can alter your ability to drive safely.  If you have any concerns once you are home that you are not improving or are in fact getting worse before you can make it to your follow-up appointment, please do not hesitate to call 911 and come back for further evaluation.  Merrily Brittle, MD  Results for orders placed or performed during the hospital encounter of 06/26/17  Comprehensive metabolic panel  Result Value Ref Range   Sodium 137 135 - 145 mmol/L   Potassium 4.7 3.5 - 5.1 mmol/L   Chloride 100 (L) 101 - 111 mmol/L   CO2 29 22 - 32 mmol/L   Glucose, Bld 88 65 - 99 mg/dL   BUN 46 (H) 6 - 20 mg/dL   Creatinine, Ser 4.09 (H) 0.44 - 1.00 mg/dL   Calcium 8.4 (L) 8.9 - 10.3 mg/dL   Total Protein 5.5 (L) 6.5 - 8.1 g/dL   Albumin 2.7 (L) 3.5 - 5.0 g/dL   AST 22 15 - 41 U/L   ALT 14 14 - 54 U/L   Alkaline Phosphatase 124 38 - 126 U/L   Total Bilirubin 0.5 0.3 - 1.2 mg/dL   GFR calc non Af Amer 27 (L) >60 mL/min   GFR calc Af Amer 31 (L) >60 mL/min   Anion gap 8 5 - 15  Ethanol  Result Value Ref Range   Alcohol, Ethyl (B) <10 <10 mg/dL  CBC with Differential  Result Value Ref Range   WBC 7.3 3.6 - 11.0 K/uL   RBC 3.71 (L) 3.80 - 5.20 MIL/uL   Hemoglobin  11.6 (L) 12.0 - 16.0 g/dL   HCT 81.1 91.4 - 78.2 %   MCV 95.9 80.0 - 100.0 fL   MCH 31.2 26.0 - 34.0 pg   MCHC 32.6 32.0 - 36.0 g/dL   RDW 95.6 (H) 21.3 - 08.6 %   Platelets 186 150 - 440 K/uL   Neutrophils Relative % 74 %   Neutro Abs 5.3 1.4 - 6.5 K/uL   Lymphocytes Relative 15 %   Lymphs Abs 1.1 1.0 - 3.6 K/uL   Monocytes Relative 10 %   Monocytes Absolute 0.7 0.2 - 0.9 K/uL   Eosinophils Relative 1 %   Eosinophils Absolute 0.1 0 - 0.7 K/uL   Basophils Relative 0 %   Basophils Absolute 0.0 0 - 0.1 K/uL  Troponin I  Result Value Ref Range   Troponin I <0.03 <0.03 ng/mL  Protime-INR  Result Value Ref Range   Prothrombin Time 13.1 11.4 - 15.2 seconds   INR 1.00    Dg Chest 2 View  Result Date: 06/25/2017 CLINICAL DATA:  Fall today.  Hypoxia EXAM: CHEST - 2 VIEW COMPARISON:  CT chest 05/22/2014 FINDINGS:  Chronic right lower lobe atelectasis unchanged. Mild pleural thickening on the right also unchanged. Mild scarring in the lingula unchanged. Cardiac enlargement. Negative for heart failure. Atherosclerotic aortic arch. Right shoulder replacement. IMPRESSION: Chronic right lower lobe atelectasis. No superimposed acute abnormality Electronically Signed   By: Marlan Palau M.D.   On: 06/25/2017 10:05   Dg Ribs Unilateral Right  Result Date: 06/25/2017 CLINICAL DATA:  Right chest pain since a fall out of bed today. Initial encounter. EXAM: RIGHT RIBS - 2 VIEW COMPARISON:  None. FINDINGS: No fracture or other bone lesions are seen involving the ribs. Remote healed right seventh rib fracture is seen. Reverse shoulder arthroplasty is in place. Convex left lumbar scoliosis and multilevel spondylosis are noted. IMPRESSION: Negative for acute fracture. Electronically Signed   By: Drusilla Kanner M.D.   On: 06/25/2017 09:38   Dg Lumbar Spine 2-3 Views  Result Date: 06/25/2017 CLINICAL DATA:  Low back pain since a fall out of bed today. Initial encounter. EXAM: LUMBAR SPINE - 2-3 VIEW  COMPARISON:  CT abdomen and pelvis 02/20/2013. FINDINGS: Marked convex left scoliosis with the apex at L3 is unchanged. No listhesis. The patient has severe multilevel degenerative disc and facet arthropathy. Large stool burden in the visualized colon noted. IMPRESSION: No acute abnormality. Severe convex left scoliosis and multilevel degenerative disease. Large colonic stool burden. Electronically Signed   By: Drusilla Kanner M.D.   On: 06/25/2017 09:39   Ct Head Wo Contrast  Result Date: 06/26/2017 CLINICAL DATA:  77 year old female with loose in a shins, recent fall EXAM: CT HEAD WITHOUT CONTRAST TECHNIQUE: Contiguous axial images were obtained from the base of the skull through the vertex without intravenous contrast. COMPARISON:  CT scan of the head yesterday 06/25/2017 FINDINGS: Brain: No evidence of acute infarction, hemorrhage, hydrocephalus, extra-axial collection or mass lesion/mass effect. Vascular: No hyperdense vessel or unexpected calcification. Bilateral cavernous carotid artery calcifications. Skull: Normal. Negative for fracture or focal lesion. Sinuses/Orbits: No acute finding. Other: Decreasing frontal scalp hematoma. IMPRESSION: 1. No acute intracranial abnormality. 2. No significant interval change compared to yesterday. 3. Decreasing frontal scalp hematoma. Electronically Signed   By: Malachy Moan M.D.   On: 06/26/2017 13:33   Ct Head Wo Contrast  Result Date: 06/25/2017 CLINICAL DATA:  Fall, head injury today EXAM: CT HEAD WITHOUT CONTRAST TECHNIQUE: Contiguous axial images were obtained from the base of the skull through the vertex without intravenous contrast. COMPARISON:  06/20/2017 FINDINGS: Brain: Ventricle size normal. Patchy hypodensity in the cerebral white matter bilaterally is unchanged from the prior study. Negative for acute infarct. Negative for acute hemorrhage mass or edema. Vascular: Negative for hyperdense vessel Skull: Negative for skull fracture.  Right frontal  scalp hematoma Sinuses/Orbits: Paranasal sinuses clear.  Bilateral cataract surgery Other: None IMPRESSION: No acute intracranial abnormality. Right frontal scalp hematoma. Negative for skull fracture. Electronically Signed   By: Marlan Palau M.D.   On: 06/25/2017 09:46   Ct Head Wo Contrast  Result Date: 06/20/2017 CLINICAL DATA:  Onset of difficulty walking and left hand numbness today. EXAM: CT HEAD WITHOUT CONTRAST TECHNIQUE: Contiguous axial images were obtained from the base of the skull through the vertex without intravenous contrast. COMPARISON:  Brain MRI 01/24/2016. FINDINGS: Brain: No evidence of acute infarction, hemorrhage, hydrocephalus, extra-axial collection or mass lesion/mass effect. Cortical atrophy and chronic microvascular ischemic change are identified. Vascular: Atherosclerosis noted. Skull: Intact. Sinuses/Orbits: Negative. Other: None. IMPRESSION: No acute abnormality. Atrophy and chronic microvascular ischemic change. Atherosclerosis. Electronically Signed  By: Drusilla Kanner M.D.   On: 06/20/2017 09:15   Mr Brain Wo Contrast  Result Date: 06/20/2017 CLINICAL DATA:  Left upper extremity weakness. EXAM: MRI HEAD WITHOUT CONTRAST MRA HEAD WITHOUT CONTRAST TECHNIQUE: Multiplanar, multiecho pulse sequences of the brain and surrounding structures were obtained without intravenous contrast. Angiographic images of the head were obtained using MRA technique without contrast. COMPARISON:  Head CT 06/20/2017 and MRI 01/24/2016 FINDINGS: MRI HEAD FINDINGS Despite the patient being pre-medicated, there is moderate to severe motion artifact throughout the examination. Brain: Diffusion imaging is considered to be of diagnostic quality and is without evidence of acute infarct. The ventricles and sulci are normal. Patchy cerebral white matter T2 hyperintensities are grossly similar to the prior MRI and compatible with mild chronic small vessel ischemic disease with a chronic lacunar infarct in  the anterior left corona radiata. No intracranial hemorrhage, mass, midline shift, or extra-axial fluid collection is identified. Vascular: Major intracranial vascular flow voids are grossly preserved. Skull and upper cervical spine: No destructive osseous lesion identified. Sinuses/Orbits: Bilateral cataract extraction. Paranasal sinuses and mastoid air cells are clear. Other: None. MRA HEAD FINDINGS The study is severely motion degraded. Signal loss in the distal V3 and proximal V4 segments is attributed to artifact. The distal V4 segments are patent to the vertebrobasilar junction. The basilar artery is patent without evidence of significant stenosis in its mid and distal portions, with motion artifact limiting assessment proximally. The PCAs are patent proximally. The internal carotid arteries are patent from skull base to carotid termini with suspected artifactual signal loss in both petrous segments and no definite high-grade stenosis more distally. The M1 segments are patent in their proximal and mid portions. The MCAs from the distal M1 level and beyond are largely obscured by motion artifact. The ACAs are grossly patent. IMPRESSION: 1. Severely motion degraded examination without acute infarct. 2. Mild chronic small vessel ischemic disease. 3. Limited MRA with grossly patent large vessels as above. Electronically Signed   By: Sebastian Ache M.D.   On: 06/20/2017 14:58   Mr Cervical Spine Wo Contrast  Result Date: 06/11/2017 CLINICAL DATA:  Chronic low back pain, worse recently. Pain, numbness, and tingling in the right lower extremity. Remote lumbar surgery. Neck pain and bilateral hand numbness. Unsteady gait. Imbalance. EXAM: MRI CERVICAL AND LUMBAR SPINE WITHOUT CONTRAST TECHNIQUE: Multiplanar and multiecho pulse sequences of the cervical spine, to include the craniocervical junction and cervicothoracic junction, and lumbar spine, were obtained without intravenous contrast. COMPARISON:  Lumbar MRI  11/20/2006. CT abdomen and pelvis 02/20/2013. No prior cervical spine imaging. FINDINGS: MRI CERVICAL SPINE FINDINGS The study is severely motion degraded as the patient kept falling asleep. Alignment: Grade 1 anterolisthesis of C4 on C5 and C5 on C6. Vertebrae: No gross fracture or suspicious osseous lesion. Degenerative endplate changes at C6-7 greater than C5-6 with severe disc space narrowing at both levels. Cord: Poorly evaluated due to the degree of motion artifact. Posterior Fossa, vertebral arteries, paraspinal tissues: Normally positioned cerebellar tonsils. No gross paraspinal soft tissue abnormality allowing for motion. Disc levels: Detailed assessment of degenerative changes is precluded by the degree of motion artifact. At C4-5, disc bulging and infolding of the ligamentum flavum result in likely moderate spinal stenosis, and there is likely mild spinal stenosis at C3-4. Facet arthrosis likely contributes to multilevel neural foraminal stenosis in the mid and lower cervical spine. MRI LUMBAR SPINE FINDINGS Segmentation:  Standard. Alignment: 4 mm anterolisthesis of L5 on S1, slightly increased from the  prior MRI. Trace retrolisthesis of L1 on L2, similar to prior. Moderate lumbar dextroscoliosis. Vertebrae: No fracture or suspicious osseous lesion. Mild degenerative endplate edema at [id number], T12-L1, L1-2, and L4-5. large L2 vertebral body hemangioma. Conus medullaris and cauda equina: Conus extends to the upper L1 level. Conus and cauda equina appear normal. Paraspinal and other soft tissues: Numerous small T2 hyperintense lesions throughout both kidneys measuring up to 2 cm bilaterally and likely representing cysts. Several T2 hypointense lesions are present in both kidneys and measure up to 1.4 cm on the right and 1.3 cm on the left, most likely hemorrhagic/proteinaceous cysts though incompletely evaluated. Bilateral lumbar paraspinal muscle atrophy. Disc levels: Disc desiccation throughout the lumbar  spine. Chronic severe disc space narrowing at L1-2 and L2-3. Since 2008, progressive disc space narrowing at other levels, severe at T11-12, T12-L1, and L3-4 and mild-to-moderate at L4-5 and L5-S1. T11-12: New mild disc bulging and mild facet hypertrophy without stenosis. T12-L1: New circumferential disc bulging greater to the left, mild facet hypertrophy, and disc space narrowing result in mild left neural foraminal stenosis without spinal stenosis. L1-2: Circumferential disc bulging asymmetric to the left, a central to left subarticular disc protrusion, and mild facet hypertrophy result in mild left and borderline right lateral recess stenosis and borderline left neural foraminal stenosis without significant spinal stenosis, unchanged. L2-3: Prior right laminotomy again noted. Minimal disc bulging, disc space height loss, and mild facet hypertrophy result in moderate right neural foraminal stenosis without spinal stenosis. Enhancing material extending into the right neural foramen on the prior study appears to have decreased (within limitations of noncontrast technique on today's study) and presumably reflected postoperative granulation tissue. L3-4: Mild circumferential disc bulging, endplate spurring, disc space height loss, and mild-to-moderate facet and ligamentum flavum hypertrophy result in mild spinal stenosis, mild right lateral recess stenosis, and mild right neural foraminal stenosis. Disc bulging has decreased and results in improved right neural foraminal and spinal canal patency. L4-5: Progressive circumferential disc bulging, a superimposed central disc protrusion, ligamentum flavum thickening, and severe facet arthrosis result in progressive severe spinal stenosis and severe right and moderate left neural foraminal stenosis. A new cystic structure along the inferior aspect of the right facet joint measures 10 x 9 mm on axial images and presumably represents a synovial cyst, resulting in severe right  lateral recess stenosis below the disc space level. L5-S1: Anterolisthesis with bulging uncovered disc and severe facet arthrosis result in mild right and moderate left neural foraminal stenosis and mild spinal stenosis, progressed from prior. IMPRESSION: 1. Severely motion degraded cervical spine MRI limiting assessment of degenerative changes. Suspected moderate spinal stenosis at C4-5, mild spinal stenosis at C3-4, and multilevel neural foraminal stenosis. Consider cervical myelogram for further evaluation as clinically warranted. 2. Progressive disc degeneration at multiple levels in the lumbar spine, most notably L4-5 where there is now severe spinal stenosis and severe right and moderate left neural foraminal stenosis. 3. New synovial cyst inferior to the right L4-5 facet joint resulting in severe right lateral recess stenosis. 4. Mild spinal stenosis and mild right and moderate left neural foraminal stenosis at L5-S1. Electronically Signed   By: Sebastian Ache M.D.   On: 06/11/2017 14:43   Mr Lumbar Spine Wo Contrast  Result Date: 06/11/2017 CLINICAL DATA:  Chronic low back pain, worse recently. Pain, numbness, and tingling in the right lower extremity. Remote lumbar surgery. Neck pain and bilateral hand numbness. Unsteady gait. Imbalance. EXAM: MRI CERVICAL AND LUMBAR SPINE WITHOUT CONTRAST TECHNIQUE: Multiplanar  and multiecho pulse sequences of the cervical spine, to include the craniocervical junction and cervicothoracic junction, and lumbar spine, were obtained without intravenous contrast. COMPARISON:  Lumbar MRI 11/20/2006. CT abdomen and pelvis 02/20/2013. No prior cervical spine imaging. FINDINGS: MRI CERVICAL SPINE FINDINGS The study is severely motion degraded as the patient kept falling asleep. Alignment: Grade 1 anterolisthesis of C4 on C5 and C5 on C6. Vertebrae: No gross fracture or suspicious osseous lesion. Degenerative endplate changes at C6-7 greater than C5-6 with severe disc space  narrowing at both levels. Cord: Poorly evaluated due to the degree of motion artifact. Posterior Fossa, vertebral arteries, paraspinal tissues: Normally positioned cerebellar tonsils. No gross paraspinal soft tissue abnormality allowing for motion. Disc levels: Detailed assessment of degenerative changes is precluded by the degree of motion artifact. At C4-5, disc bulging and infolding of the ligamentum flavum result in likely moderate spinal stenosis, and there is likely mild spinal stenosis at C3-4. Facet arthrosis likely contributes to multilevel neural foraminal stenosis in the mid and lower cervical spine. MRI LUMBAR SPINE FINDINGS Segmentation:  Standard. Alignment: 4 mm anterolisthesis of L5 on S1, slightly increased from the prior MRI. Trace retrolisthesis of L1 on L2, similar to prior. Moderate lumbar dextroscoliosis. Vertebrae: No fracture or suspicious osseous lesion. Mild degenerative endplate edema at [id number], T12-L1, L1-2, and L4-5. large L2 vertebral body hemangioma. Conus medullaris and cauda equina: Conus extends to the upper L1 level. Conus and cauda equina appear normal. Paraspinal and other soft tissues: Numerous small T2 hyperintense lesions throughout both kidneys measuring up to 2 cm bilaterally and likely representing cysts. Several T2 hypointense lesions are present in both kidneys and measure up to 1.4 cm on the right and 1.3 cm on the left, most likely hemorrhagic/proteinaceous cysts though incompletely evaluated. Bilateral lumbar paraspinal muscle atrophy. Disc levels: Disc desiccation throughout the lumbar spine. Chronic severe disc space narrowing at L1-2 and L2-3. Since 2008, progressive disc space narrowing at other levels, severe at T11-12, T12-L1, and L3-4 and mild-to-moderate at L4-5 and L5-S1. T11-12: New mild disc bulging and mild facet hypertrophy without stenosis. T12-L1: New circumferential disc bulging greater to the left, mild facet hypertrophy, and disc space narrowing  result in mild left neural foraminal stenosis without spinal stenosis. L1-2: Circumferential disc bulging asymmetric to the left, a central to left subarticular disc protrusion, and mild facet hypertrophy result in mild left and borderline right lateral recess stenosis and borderline left neural foraminal stenosis without significant spinal stenosis, unchanged. L2-3: Prior right laminotomy again noted. Minimal disc bulging, disc space height loss, and mild facet hypertrophy result in moderate right neural foraminal stenosis without spinal stenosis. Enhancing material extending into the right neural foramen on the prior study appears to have decreased (within limitations of noncontrast technique on today's study) and presumably reflected postoperative granulation tissue. L3-4: Mild circumferential disc bulging, endplate spurring, disc space height loss, and mild-to-moderate facet and ligamentum flavum hypertrophy result in mild spinal stenosis, mild right lateral recess stenosis, and mild right neural foraminal stenosis. Disc bulging has decreased and results in improved right neural foraminal and spinal canal patency. L4-5: Progressive circumferential disc bulging, a superimposed central disc protrusion, ligamentum flavum thickening, and severe facet arthrosis result in progressive severe spinal stenosis and severe right and moderate left neural foraminal stenosis. A new cystic structure along the inferior aspect of the right facet joint measures 10 x 9 mm on axial images and presumably represents a synovial cyst, resulting in severe right lateral recess stenosis below the  disc space level. L5-S1: Anterolisthesis with bulging uncovered disc and severe facet arthrosis result in mild right and moderate left neural foraminal stenosis and mild spinal stenosis, progressed from prior. IMPRESSION: 1. Severely motion degraded cervical spine MRI limiting assessment of degenerative changes. Suspected moderate spinal stenosis  at C4-5, mild spinal stenosis at C3-4, and multilevel neural foraminal stenosis. Consider cervical myelogram for further evaluation as clinically warranted. 2. Progressive disc degeneration at multiple levels in the lumbar spine, most notably L4-5 where there is now severe spinal stenosis and severe right and moderate left neural foraminal stenosis. 3. New synovial cyst inferior to the right L4-5 facet joint resulting in severe right lateral recess stenosis. 4. Mild spinal stenosis and mild right and moderate left neural foraminal stenosis at L5-S1. Electronically Signed   By: Sebastian Ache M.D.   On: 06/11/2017 14:43   US Carotid Bilateral (at [facility] Only)  Result Date: 06/21/2017 CLINICAL DATA:  TIA symptoms, hypertension,, diabetes EXAM: BILATERAL CAROTID DUPLEX ULTRASOUND TECHNIQUE: Wallace Cullens scale imaging, color Doppler and duplex ultrasound were performed of bilateral carotid and vertebral arteries in the neck. COMPARISON:  None. FINDINGS: Criteria: Quantification of carotid stenosis is based on velocity parameters that correlate the residual internal carotid diameter with NASCET-based stenosis levels, using the diameter of the distal internal carotid lumen as the denominator for stenosis measurement. The following velocity measurements were obtained: RIGHT ICA: 96/26 cm/sec CCA: 85/21 cm/sec SYSTOLIC ICA/CCA RATIO:  1.1 ECA:  268 cm/sec LEFT ICA: 83/22 cm/sec CCA: 70/14 cm/sec SYSTOLIC ICA/CCA RATIO:  1.2 ECA:  80 cm/sec RIGHT CAROTID ARTERY: Minor echogenic shadowing plaque formation. No hemodynamically significant right ICA stenosis, velocity elevation, or turbulent flow. Degree of narrowing less than 50%. RIGHT VERTEBRAL ARTERY:  Antegrade LEFT CAROTID ARTERY: Similar scattered minor echogenic plaque formation. No hemodynamically significant left ICA stenosis, velocity elevation, or turbulent flow. LEFT VERTEBRAL ARTERY:  Antegrade IMPRESSION: Minor carotid atherosclerosis. No hemodynamically  significant ICA stenosis. Degree of narrowing less than 50% bilaterally by ultrasound criteria. Patent antegrade vertebral flow bilaterally Electronically Signed   By: Judie Petit.  Shick M.D.   On: 06/21/2017 07:46   Dg Chest Port 1 View  Result Date: 06/26/2017 CLINICAL DATA:  77 year old female with persistent hallucinations EXAM: PORTABLE CHEST 1 VIEW COMPARISON:  Prior chest x-ray 06/25/2017 FINDINGS: Stable cardiomegaly. Enlarged main pulmonary artery suggests underlying pulmonary arterial hypertension. Atherosclerotic calcifications present in the transverse aorta. No pulmonary edema. Linear atelectasis versus scarring in the lingula. Stable moderate right pleural effusion and associated right lower lobe atelectasis. The bones appear demineralized. No acute osseous abnormality. Right shoulder joint arthroplasty. IMPRESSION: 1. Stable chest x-ray without significant interval change compared to 06/25/2017. 2. Moderate right pleural effusion with associated right lower lobe atelectasis. 3. Stable cardiomegaly. 4. Stable pulmonary artery enlargement suggesting underlying pulmonary arterial hypertension. 5.  Aortic Atherosclerosis (ICD10-170.0) Electronically Signed   By: Malachy Moan M.D.   On: 06/26/2017 13:29   Dg Humerus Right  Result Date: 06/25/2017 CLINICAL DATA:  Right humerus pain secondary to a fall from her bed today. EXAM: RIGHT HUMERUS - 2+ VIEW COMPARISON:  Radiographs dated 01/08/2017 FINDINGS: The right humerus is intact. Right reverse total shoulder prosthesis in place. There appears to be a fracture through the base of the coracoid process. Slight degenerative changes at the [facility] joint. IMPRESSION: 1. Intact humerus.  No dislocation. 2. Fracture of the base of the coracoid process of the scapula. Electronically Signed   By: Francene Boyers M.D.   On: 06/25/2017  09:41   Mr Maxine Glenn Head/brain [facility] Cm  Result Date: 06/20/2017 CLINICAL DATA:  Left upper extremity weakness. EXAM: MRI HEAD WITHOUT  CONTRAST MRA HEAD WITHOUT CONTRAST TECHNIQUE: Multiplanar, multiecho pulse sequences of the brain and surrounding structures were obtained without intravenous contrast. Angiographic images of the head were obtained using MRA technique without contrast. COMPARISON:  Head CT 06/20/2017 and MRI 01/24/2016 FINDINGS: MRI HEAD FINDINGS Despite the patient being pre-medicated, there is moderate to severe motion artifact throughout the examination. Brain: Diffusion imaging is considered to be of diagnostic quality and is without evidence of acute infarct. The ventricles and sulci are normal. 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 No intracranial hemorrhage, mass, midline shift, or extra-axial fluid collection is identified. Vascular: Major intracranial vascular flow voids are grossly preserved. Skull and upper cervical spine: No destructive osseous lesion identified. Sinuses/Orbits: Bilateral cataract extraction. Paranasal sinuses and mastoid air cells are clear. Other: None. MRA HEAD FINDINGS The study is severely motion degraded. Signal loss in the distal V3 and proximal V4 segments is attributed to artifact. The distal V4 segments are patent to the vertebrobasilar junction. The basilar artery is patent without evidence of significant stenosis in its mid and distal portions, with motion artifact limiting assessment proximally. The PCAs are patent proximally. The internal carotid arteries are patent from skull base to carotid termini with suspected artifactual signal loss in both petrous segments and no definite high-grade stenosis more distally. The M1 segments are patent in their proximal and mid portions. The MCAs from the distal M1 level and beyond are largely obscured by motion artifact. The ACAs are grossly patent. IMPRESSION: 1. Severely  motion degraded examination without acute infarct. 2. Mild chronic small vessel ischemic disease. 3. Limited MRA with grossly patent large vessels as above. Electronically Signed   By: Sebastian Ache M.D.   On: 06/20/2017 14:58   Dg Hip Unilat W Or Wo Pelvis 2-3 Views Right  Result Date: 06/25/2017 CLINICAL DATA:  Fall.  Diffuse pain. EXAM: DG HIP (WITH OR WITHOUT PELVIS) 2-3V RIGHT COMPARISON:  06/25/2017. FINDINGS: Diffuse osteopenia. Degenerative changes lumbar spine and both hips. No acute bony abnormality. No evidence of fracture or dislocation. Pelvic calcifications consistent phleboliths. IMPRESSION: Diffuse osteopenia. Degenerative changes lumbar spine and both hips. No acute bony or joint abnormality. Electronically Signed   By: Maisie Fus  Register   On: 06/25/2017 09:41

## 2017-06-26 NOTE — ED Triage Notes (Signed)
Pt arrives via ems from home. Pt seen here yesterday for fall. EMS states pt ct was clear yesterday. EMS states pt started experiencing hallucinations since leaving the hospital. Pt a&o x4. Pupils appear constricted with little and slow response to light. nad noted at this time

## 2017-06-26 NOTE — ED Notes (Signed)
External catheter in place for urine sample.

## 2017-06-26 NOTE — ED Provider Notes (Signed)
[facility] Emergency Department Provider Note  ____________________________________________   First MD Initiated Contact with Patient 06/26/17 1242     (approximate)  I have reviewed the triage vital signs and the nursing notes.   HISTORY  Chief Complaint Head Injury   HPI Brenda Glenn is a 77 y.o. female who comes to the emergency department via EMS with confusion and hallucinations for the past 24 hours or so.  She was seen in our emergency department yesterday after a mechanical fall suffering a significant head injury.  She had normal CTs and was discharged home.  Ever since going home family is noted that the patient has been somewhat confused and has been responding to internal stimuli.  No fevers or chills.  No chest pain or shortness of breath.  She does have some moderate severity throbbing headache.  She does not take any blood thinning medication.  Her symptoms have been gradual onset slowly progressive now moderate severity.  Nothing particular seems to make them better or worse.  Past Medical History:  Diagnosis Date  . Arthritis    all over  . Back pain   . COPD (chronic obstructive pulmonary disease) (HCC)    O2 use continuosly at 2 liters/ some  asthma symptoms  . Diabetes mellitus without complication (HCC)    diet controlled  . GERD (gastroesophageal reflux disease)   . Heart murmur    lifelong  . Hypercholesterolemia   . Hypertension    controlled on meds  . Pneumonia 2015   [facility] hospitalized  . Renal insufficiency    early stage kidney failure  . Shortness of breath dyspnea   . Sleep apnea    2nd sleep study said pt does not have sleep apnea  . Swelling    of feet and legs    Patient Active Problem List   Diagnosis Date Noted  . ARF (acute renal failure) (HCC) 06/20/2017  . 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 Lymphedema 01/06/2016  . Chronic venous insufficiency 01/06/2016  . Pain in limb 01/06/2016  .  COPD (chronic obstructive pulmonary disease) (HCC) 01/06/2016  . Abnormality of gait 01/06/2016    Past Surgical History:  Procedure Laterality Date  . ABDOMINAL HYSTERECTOMY  1988  . BACK SURGERY  1978 and 1995   residual nerve damage legs-weakness  . BLADDER SURGERY  2017  . CATARACT EXTRACTION Right   . CATARACT EXTRACTION W/PHACO Left 09/05/2014   Procedure: CATARACT EXTRACTION PHACO AND INTRAOCULAR LENS PLACEMENT (IOC);  Surgeon: Lockie Mola, MD;  Location: [facility];  Service: Ophthalmology;  Laterality: Left;  DIABETIC  . CHOLECYSTECTOMY  2011  . JOINT REPLACEMENT Right 2009  . REPLACEMENT TOTAL KNEE Right   . REVERSE SHOULDER ARTHROPLASTY Right 01/08/2017   Procedure: REVERSE SHOULDER ARTHROPLASTY;  Surgeon: Signa Kell, MD;  Location: [facility] ORS;  Service: Orthopedics;  Laterality: Right;    Prior to Admission medications   Medication Sig Start Date End Date Taking? Authorizing Provider  albuterol (PROVENTIL HFA;VENTOLIN HFA) 108 (90 Base) MCG/ACT inhaler Inhale 2 puffs into the lungs every 6 (six) hours as needed for wheezing or shortness of breath.    [provider]  allopurinol (ZYLOPRIM) 100 MG tablet Take 200 mg by mouth daily. In am.    [provider]  diclofenac (FLECTOR) 1.3 % PTCH Place 1 patch onto the skin daily as needed (for pain).    [provider]  ezetimibe (ZETIA) 10 MG tablet Take 10 mg by  mouth at bedtime.    [provider]  felodipine (PLENDIL) 10 MG 24 hr tablet Take 10 mg by mouth at bedtime.    [provider]  fluticasone (FLONASE) 50 MCG/ACT nasal spray Place 2 sprays into both nostrils daily as needed for allergies. 12/08/16   [provider]  fluticasone-salmeterol (ADVAIR HFA) 115-21 MCG/ACT inhaler Inhale 2 puffs into the lungs 2 (two) times daily.     [provider]  furosemide (LASIX) 20 MG tablet Take 40 mg by mouth daily.     [provider]  gabapentin  (NEURONTIN) 100 MG capsule Take 100 mg by mouth 3 (three) times daily. 06/09/17   [provider]  guaiFENesin (MUCINEX) 600 MG 12 hr tablet Take 600 mg by mouth every 12 (twelve) hours as needed for cough.    [provider]  losartan (COZAAR) 100 MG tablet Take 100 mg by mouth daily. In am    [provider]  metoprolol succinate (TOPROL-XL) 100 MG 24 hr tablet Take 100 mg by mouth daily. Take with or immediately following a meal.  In am.    [provider]  nicotine (NICODERM CQ - DOSED IN MG/24 HOURS) 21 mg/24hr patch Place 1 patch (21 mg total) onto the skin daily. 06/21/17   Houston Siren, MD  nystatin (MYCOSTATIN) 100000 UNIT/ML suspension Take 5 mLs by mouth 4 (four) times daily as needed (for trush).  11/25/16   [provider]  omeprazole (PRILOSEC) 40 MG capsule Take 40 mg by mouth daily.     [provider]  oxyCODONE (ROXICODONE) 15 MG immediate release tablet Take 15 mg by mouth every 4 (four) hours.     [provider]  pramipexole (MIRAPEX) 0.5 MG tablet Take 1 mg by mouth at bedtime.     [provider]  promethazine (PHENERGAN) 25 MG tablet Take 25 mg by mouth every 6 (six) hours as needed for nausea or vomiting.    [provider]  theophylline (THEO-24) 400 MG 24 hr capsule Take 400 mg by mouth daily. In am.    [provider]  tiotropium (SPIRIVA) 18 MCG inhalation capsule Place 18 mcg into inhaler and inhale daily. 2 puffs daily.    [provider]  triamcinolone cream (KENALOG) 0.1 % Apply 1 application topically 2 (two) times daily as needed (for rash).    [provider]  valACYclovir (VALTREX) 500 MG tablet Take 500 mg by mouth 2 (two) times daily as needed (fever blisters).  05/07/17   [provider]  Vitamin D, Ergocalciferol, (DRISDOL) 50000 UNITS CAPS capsule Take 50,000 Units by mouth every 14 (fourteen) days.    [provider]     Allergies Tequin [gatifloxacin]; Keflex [cephalexin]; and Lisinopril  Family History  Problem Relation Age of Onset  . Breast cancer Mother 51  . Breast cancer Paternal Grandmother 51    Social History Social History   Tobacco Use  . Smoking status: Current Every Day Smoker    Packs/day: 0.25    Years: 50.00    Pack years: 12.50    Types: Cigarettes  . Smokeless tobacco: Never Used  Substance Use Topics  . Alcohol use: Yes    Alcohol/week: 1.8 oz    Types: 3 Glasses of wine per week    Comment: rarely  . Drug use: No    Review of Systems Constitutional: No fever/chills Eyes: No visual changes. ENT: No sore throat. Cardiovascular: Positive for chest pain. Respiratory: Denies  shortness of breath. Gastrointestinal: No abdominal pain.  No nausea, no vomiting.  No diarrhea.  No constipation. Genitourinary: Negative for dysuria. Musculoskeletal: Negative for back pain. Skin: Negative for rash. Neurological: Positive for headache   ____________________________________________   PHYSICAL EXAM:  VITAL SIGNS: ED Triage Vitals  Enc Vitals Group     BP      Pulse      Resp      Temp      Temp src      SpO2      Weight      Height      Head Circumference      Peak Flow      Pain Score      Pain Loc      Pain Edu?      Excl. in GC?     Constitutional: Pleasant cooperative somewhat confused Eyes: PERRL EOMI. range and brisk Head: Significant old appearing ecchymosis across anterior face.  Periorbitally Nose: No congestion/rhinnorhea. Mouth/Throat: No trismus Neck: No stridor.   Cardiovascular: Normal rate, regular rhythm. Grossly normal heart sounds.  Good peripheral circulation. Respiratory: Normal respiratory effort.  No retractions. Lungs CTAB and moving good air Gastrointestinal: Soft nontender Musculoskeletal: No lower extremity edema   Neurologic:  . No gross focal neurologic deficits are appreciated. Skin:  Skin is warm, dry and intact. No rash  noted.    ____________________________________________   DIFFERENTIAL includes but not limited to  Delayed intracerebral hemorrhage, subdural hematoma, concussion, pneumothorax, pulmonary contusion ____________________________________________   LABS (all labs ordered are listed, but only abnormal results are displayed)  Labs Reviewed  URINE DRUG SCREEN, QUALITATIVE ([facility] ONLY) - Abnormal; Notable for the following components:      Result Value   Opiate, Ur Screen POSITIVE (*)    Benzodiazepine, Ur Scrn POSITIVE (*)    All other components within normal limits  URINALYSIS, COMPLETE (UACMP) WITH MICROSCOPIC - Abnormal; Notable for the following components:   Color, Urine YELLOW (*)    APPearance CLEAR (*)    Hgb urine dipstick MODERATE (*)    Leukocytes, UA SMALL (*)    All other components within normal limits  COMPREHENSIVE METABOLIC PANEL - Abnormal; Notable for the following components:   Chloride 100 (*)    BUN 46 (*)    Creatinine, Ser 1.76 (*)    Calcium 8.4 (*)    Total Protein 5.5 (*)    Albumin 2.7 (*)    GFR calc non Af Amer 27 (*)    GFR calc Af Amer 31 (*)    All other components within normal limits  CBC WITH DIFFERENTIAL/PLATELET - Abnormal; Notable for the following components:   RBC 3.71 (*)    Hemoglobin 11.6 (*)    RDW 17.9 (*)    All other components within normal limits  ETHANOL  TROPONIN I  PROTIME-INR    Lab work reviewed by me shows the patient has benzodiazepine and opioid positive likely from her state yesterday.  Slight dehydration. __________________________________________  EKG   ____________________________________________  RADIOLOGY  Chest x-ray reviewed by me with no acute disease Head CT reviewed by me with no acute disease ____________________________________________   PROCEDURES  Procedure(s) performed: no  Procedures  Critical Care performed: no  Observation:  no ____________________________________________   INITIAL IMPRESSION / ASSESSMENT AND PLAN / ED COURSE  Pertinent labs & imaging results that were available during my care of the patient were reviewed by me and considered in my medical decision making (see  chart for details).  The patient arrives with increasing confusion and hallucinations 24 hours after significant head injury.  While she is not on any blood thinning medication it is certainly possible she could have developed a delayed subdural hematoma.  Head CT is pending.  Fortunately the patient's neuroimaging is negative for acute pathology.  I had a lengthy discussion with the patient family at bedside regarding the signs and symptoms of a concussion and that the patient is medically stable for outpatient management.  Strict return precautions have been given and everyone verbalized understanding agree with plan.       FINAL CLINICAL IMPRESSION(S) / ED DIAGNOSES  Final diagnoses:  Concussion with loss of consciousness, subsequent encounter  Injury of head, subsequent encounter      NEW MEDICATIONS STARTED DURING THIS VISIT:  Discharge Medication List as of 06/26/2017  2:31 PM       Note:  This document was prepared using Dragon voice recognition software and may include unintentional dictation errors.     Merrily Brittle, MD 06/29/17 2208

## 2017-06-26 NOTE — ED Notes (Signed)
Pt c/o seeing things that aren't there. Is a&ox4. Had a head injury yesterday with negative ct's. Pt unsure if placed on pain medications. Pupils are sluggish, pt states trouble seeing otherwise.

## 2017-07-06 ENCOUNTER — Ambulatory Visit (INDEPENDENT_AMBULATORY_CARE_PROVIDER_SITE_OTHER): Payer: Medicare Other | Admitting: Vascular Surgery

## 2017-07-28 ENCOUNTER — Ambulatory Visit (INDEPENDENT_AMBULATORY_CARE_PROVIDER_SITE_OTHER): Payer: Medicare Other | Admitting: Vascular Surgery

## 2017-07-28 ENCOUNTER — Encounter (INDEPENDENT_AMBULATORY_CARE_PROVIDER_SITE_OTHER): Payer: Self-pay | Admitting: Vascular Surgery

## 2017-07-28 VITALS — BP 97/63 | HR 91 | Resp 14 | Ht 59.0 in | Wt 179.0 lb

## 2017-07-28 DIAGNOSIS — R600 Localized edema: Secondary | ICD-10-CM | POA: Insufficient documentation

## 2017-07-28 DIAGNOSIS — R200 Anesthesia of skin: Secondary | ICD-10-CM

## 2017-07-28 NOTE — Progress Notes (Signed)
Subjective:    Patient ID: Brenda Glenn, female    DOB: 03/17/1940, 77 y.o.   MRN: [id number] Chief Complaint  Patient presents with  . Follow-up    Varicose vein with healed ulcer   Presents as a new patient referred by Dr. Audelia Acton for evaluation of ulcer associated with varicose veins.  She was seen with her husband.  Patient notes experiencing ulcers to the left calf approximately 2 weeks ago.  The patient notes she was treated with and what sounds like an Unna wrap however these were removed this week by Dr. Audelia Acton.  The patient notes that she has compression socks and a lymphedema pump however does not use them on a daily basis.  The patient does note a long-standing history of edema to the bilateral lower extremity which has progressively worsened.  The patient notes that her swelling is worse towards the end of the day.  The patient notes that she experiences numbness to the bilateral feet.  Patient is not very ambulatory and does not do much walking therefore denies any claudication-like symptoms.  The patient denies any rest pain.  She denies any recent surgery or trauma to the bilateral lower extremity.  Patient denies any DVT history.  Patient denies any erythema to the bilateral lower extremity patient denies any fever, nausea or vomiting.  Review of Systems  Constitutional: Negative.   HENT: Negative.   Eyes: Negative.   Respiratory: Negative.   Cardiovascular: Positive for leg swelling.       Lower extremity numbness  Gastrointestinal: Negative.   Endocrine: Negative.   Genitourinary: Negative.   Musculoskeletal: Negative.   Skin: Negative.   Allergic/Immunologic: Negative.   Neurological: Negative.   Hematological: Negative.   Psychiatric/Behavioral: Negative.       Objective:   Physical Exam  Constitutional: She is oriented to person, place, and time. She appears well-developed and well-nourished. No distress.  HENT:  Head: Normocephalic and atraumatic.  Right  Ear: External ear normal.  Left Ear: External ear normal.  Eyes: Pupils are equal, round, and reactive to light. Conjunctivae and EOM are normal.  Neck: Normal range of motion.  Cardiovascular: Normal rate, regular rhythm, normal heart sounds and intact distal pulses.  Pulses:      Radial pulses are 2+ on the right side, and 2+ on the left side.  Hard to palpate pedal pulses due to body habitus and edema  Pulmonary/Chest: Effort normal and breath sounds normal.  Musculoskeletal: Normal range of motion. She exhibits edema (Moderate edema noted to the bilateral lower extremity.  Left greater than right.).  Neurological: She is alert and oriented to person, place, and time.  Skin: She is not diaphoretic.  There is no stasis dermatitis.  There is the beginning of fibrosis noted bilaterally.  There is no active cellulitis or ulcerations noted to the bilateral lower extremity.  Psychiatric: She has a normal mood and affect. Her behavior is normal. Judgment and thought content normal.  Vitals reviewed.  BP 97/63 (BP Location: Left Arm, Patient Position: Sitting)   Pulse 91   Resp 14   Ht  (1.499 m)   Wt 179 lb (81.2 kg)   BMI 36.15 kg/m   Past Medical History:  Diagnosis Date  . Arthritis    all over  . Back pain   . COPD (chronic obstructive pulmonary disease) (HCC)    O2 use continuosly at 2 liters/ some  asthma symptoms  . Diabetes mellitus without complication (HCC)  diet controlled  . GERD (gastroesophageal reflux disease)   . Heart murmur    lifelong  . Hypercholesterolemia   . Hypertension    controlled on meds  . Pneumonia 2015   [facility] hospitalized  . Renal insufficiency    early stage kidney failure  . Shortness of breath dyspnea   . Sleep apnea    2nd sleep study said pt does not have sleep apnea  . Swelling    of feet and legs   Social History   Socioeconomic History  . Marital status: Married    Spouse name: Not on file  . Number of children: Not on  file  . Years of education: Not on file  . Highest education level: Not on file  Occupational History  . Not on file  Social Needs  . Financial resource strain: Not on file  . Food insecurity:    Worry: Not on file    Inability: Not on file  . Transportation needs:    Medical: Not on file    Non-medical: Not on file  Tobacco Use  . Smoking status: Current Every Day Smoker    Packs/day: 0.25    Years: 50.00    Pack years: 12.50    Types: Cigarettes  . Smokeless tobacco: Never Used  Substance and Sexual Activity  . Alcohol use: Yes    Alcohol/week: 1.8 oz    Types: 3 Glasses of wine per week    Comment: rarely  . Drug use: No  . Sexual activity: Not on file  Lifestyle  . Physical activity:    Days per week: Not on file    Minutes per session: Not on file  . Stress: Not on file  Relationships  . Social connections:    Talks on phone: Not on file    Gets together: Not on file    Attends religious service: Not on file    Active member of club or organization: Not on file    Attends meetings of clubs or organizations: Not on file    Relationship status: Not on file  . Intimate partner violence:    Fear of current or ex partner: Not on file    Emotionally abused: Not on file    Physically abused: Not on file    Forced sexual activity: Not on file  Other Topics Concern  . Not on file  Social History Narrative  . Not on file   Past Surgical History:  Procedure Laterality Date  . ABDOMINAL HYSTERECTOMY  1988  . BACK SURGERY  1978 and 1995   residual nerve damage legs-weakness  . BLADDER SURGERY  2017  . CATARACT EXTRACTION Right   . CATARACT EXTRACTION W/PHACO Left 09/05/2014   Procedure: CATARACT EXTRACTION PHACO AND INTRAOCULAR LENS PLACEMENT (IOC);  Surgeon: Lockie Mola, MD;  Location: [facility];  Service: Ophthalmology;  Laterality: Left;  DIABETIC  . CHOLECYSTECTOMY  2011  . JOINT REPLACEMENT Right 2009  . REPLACEMENT TOTAL KNEE Right   .  REVERSE SHOULDER ARTHROPLASTY Right 01/08/2017   Procedure: REVERSE SHOULDER ARTHROPLASTY;  Surgeon: Signa Kell, MD;  Location: [facility];  Service: Orthopedics;  Laterality: Right;   Family History  Problem Relation Age of Onset  . Breast cancer Mother 34  . Breast cancer Paternal Grandmother 13   Allergies  Allergen Reactions  . Tequin [Gatifloxacin] Shortness Of Breath  . Bupropion     Other reaction(s): Other (See Comments) shaking  . Keflex [Cephalexin] Other (See Comments)  Unknown  . Lisinopril Other (See Comments)    Unknown  . Varenicline Nausea Only and Nausea And Vomiting      Assessment & Plan:  Presents as a new patient referred by Dr. Audelia Acton for evaluation of ulcer associated with varicose veins.  She was seen with her husband.  Patient notes experiencing ulcers to the left calf approximately 2 weeks ago.  The patient notes she was treated with and what sounds like an Unna wrap however these were removed this week by Dr. Audelia Acton.  The patient notes that she has compression socks and a lymphedema pump however does not use them on a daily basis.  The patient does note a long-standing history of edema to the bilateral lower extremity which has progressively worsened.  The patient notes that her swelling is worse towards the end of the day.  The patient notes that she experiences numbness to the bilateral feet.  Patient is not very ambulatory and does not do much walking therefore denies any claudication-like symptoms.  The patient denies any rest pain.  She denies any recent surgery or trauma to the bilateral lower extremity.  Patient denies any DVT history.  Patient denies any erythema to the bilateral lower extremity patient denies any fever, nausea or vomiting.  1. Bilateral lower extremity edema - New Patient presents today at the request of Dr. Audelia Acton for bilateral lower extremity edema, varicose veins, lymphedema with ulceration. The patient was recently treated with a Unna  wrap to the left lower extremity due to a edema exacerbation with ulceration.  At this time, there are no active ulcerations however the patient's bilateral legs are very edematous. I recommended 3 layer zinc oxide Unna wraps to bilateral lower extremity to gain control the patient's swelling with the hope of then transitioning her into medical grade 1 compression socks however both the patient and her husband refused this. We discussed appropriate elevation as heart level or higher as much as possible The patient is to use her lymphedema pump at least twice a day for an hour each time Patient and her husband did agree to come back for an ABI and to undergo a bilateral venous duplex to rule out any contributing venous versus lymphatic disease. The patient was instructed to call the office in the interim if any worsening edema or ulcerations to the legs, feet or toes occurs. The patient expresses their understanding  - VAS [place] LOWER EXTREMITY VENOUS REFLUX; Future  2. Lower extremity numbness - New As above  - VAS [place] ABI WITH/WO TBI; Future  Current Outpatient Medications on File Prior to Visit  Medication Sig Dispense Refill  . albuterol (PROVENTIL HFA;VENTOLIN HFA) 108 (90 Base) MCG/ACT inhaler Inhale 2 puffs into the lungs every 6 (six) hours as needed for wheezing or shortness of breath.    . allopurinol (ZYLOPRIM) 100 MG tablet Take 200 mg by mouth daily. In am.    . aspirin EC 81 MG tablet Take by mouth.    . diclofenac (FLECTOR) 1.3 % PTCH Place 1 patch onto the skin daily as needed (for pain).    Marland Kitchen doxycycline (VIBRAMYCIN) 100 MG capsule Take 100 mg by mouth 2 (two) times daily.    Marland Kitchen ezetimibe (ZETIA) 10 MG tablet Take 10 mg by mouth at bedtime.    . felodipine (PLENDIL) 10 MG 24 hr tablet Take 10 mg by mouth at bedtime.    . fluticasone-salmeterol (ADVAIR HFA) 115-21 MCG/ACT inhaler Inhale 2 puffs into the lungs 2 (two)  times daily.     . furosemide (LASIX) 20 MG tablet Take 40 mg by  mouth daily.     Marland Kitchen gabapentin (NEURONTIN) 100 MG capsule Take 100 mg by mouth 3 (three) times daily.  0  . losartan (COZAAR) 100 MG tablet Take 100 mg by mouth daily. In am    . meloxicam (MOBIC) 15 MG tablet TK 1 T PO ONCE D  1  . metoprolol succinate (TOPROL-XL) 100 MG 24 hr tablet Take 100 mg by mouth daily. Take with or immediately following a meal.  In am.    . nicotine (NICODERM CQ - DOSED IN MG/24 HOURS) 14 mg/24hr patch APP 1 PA EXT TO THE SKIN D  0  . nystatin (MYCOSTATIN) 100000 UNIT/ML suspension Take 5 mLs by mouth 4 (four) times daily as needed (for trush).     Marland Kitchen omeprazole (PRILOSEC) 40 MG capsule Take 40 mg by mouth daily.     Marland Kitchen oxyCODONE (ROXICODONE) 15 MG immediate release tablet Take 15 mg by mouth every 4 (four) hours.     . pramipexole (MIRAPEX) 0.5 MG tablet Take 1 mg by mouth at bedtime.     . promethazine (PHENERGAN) 25 MG tablet Take 25 mg by mouth every 6 (six) hours as needed for nausea or vomiting.    . theophylline (THEO-24) 400 MG 24 hr capsule Take 400 mg by mouth daily. In am.    . tiotropium (SPIRIVA) 18 MCG inhalation capsule Place 18 mcg into inhaler and inhale daily. 2 puffs daily.    Marland Kitchen triamcinolone cream (KENALOG) 0.1 % Apply 1 application topically 2 (two) times daily as needed (for rash).    . Vitamin D, Ergocalciferol, (DRISDOL) 50000 UNITS CAPS capsule Take 50,000 Units by mouth every 14 (fourteen) days.    . fluticasone (FLONASE) 50 MCG/ACT nasal spray Place 2 sprays into both nostrils daily as needed for allergies.    Marland Kitchen guaiFENesin (MUCINEX) 600 MG 12 hr tablet Take 600 mg by mouth every 12 (twelve) hours as needed for cough.    . valACYclovir (VALTREX) 500 MG tablet Take 500 mg by mouth 2 (two) times daily as needed (fever blisters).   2   No current facility-administered medications on file prior to visit.    There are no Patient Instructions on file for this visit. No follow-ups on file.  Damoni Erker A Arisbel Maione, PA-C

## 2017-07-29 ENCOUNTER — Telehealth (INDEPENDENT_AMBULATORY_CARE_PROVIDER_SITE_OTHER): Payer: Self-pay | Admitting: Vascular Surgery

## 2017-07-29 NOTE — Telephone Encounter (Signed)
Patients hubby called about the lymph.. pump. They need to know who to contact about the equipment because they need a new ac cord.

## 2017-07-29 NOTE — Telephone Encounter (Signed)
Patient can call Natalia Leatherwood with [employer] - [phone].

## 2017-08-05 ENCOUNTER — Other Ambulatory Visit: Payer: Self-pay | Admitting: Orthopedic Surgery

## 2017-08-05 DIAGNOSIS — M25511 Pain in right shoulder: Secondary | ICD-10-CM

## 2017-08-05 DIAGNOSIS — Z6841 Body Mass Index (BMI) 40.0 and over, adult: Secondary | ICD-10-CM | POA: Insufficient documentation

## 2017-08-14 ENCOUNTER — Ambulatory Visit
Admission: EM | Admit: 2017-08-14 | Discharge: 2017-08-14 | Discharge status: Against Medical Advice | Payer: Medicare Other | Attending: Family Medicine | Admitting: Family Medicine

## 2017-08-15 ENCOUNTER — Ambulatory Visit (INDEPENDENT_AMBULATORY_CARE_PROVIDER_SITE_OTHER)
Admission: EM | Admit: 2017-08-15 | Discharge: 2017-08-15 | Disposition: A | Discharge status: Nursing Home (Includes ICF) | Payer: Medicare Other | Source: Home / Self Care

## 2017-08-15 ENCOUNTER — Encounter: Payer: Self-pay | Admitting: Gynecology

## 2017-08-15 ENCOUNTER — Other Ambulatory Visit: Payer: Self-pay

## 2017-08-15 ENCOUNTER — Inpatient Hospital Stay: Payer: Medicare Other

## 2017-08-15 ENCOUNTER — Emergency Department: Payer: Medicare Other

## 2017-08-15 ENCOUNTER — Inpatient Hospital Stay
Admission: EM | Admit: 2017-08-15 | Discharge: 2017-08-26 | DRG: 853 | Disposition: A | Discharge status: Skilled Nursing Facility | Payer: Medicare Other | Attending: Internal Medicine | Admitting: Internal Medicine

## 2017-08-15 ENCOUNTER — Encounter: Payer: Self-pay | Admitting: Emergency Medicine

## 2017-08-15 DIAGNOSIS — H539 Unspecified visual disturbance: Secondary | ICD-10-CM | POA: Insufficient documentation

## 2017-08-15 DIAGNOSIS — K219 Gastro-esophageal reflux disease without esophagitis: Secondary | ICD-10-CM

## 2017-08-15 DIAGNOSIS — Z9049 Acquired absence of other specified parts of digestive tract: Secondary | ICD-10-CM

## 2017-08-15 DIAGNOSIS — N390 Urinary tract infection, site not specified: Secondary | ICD-10-CM | POA: Diagnosis present

## 2017-08-15 DIAGNOSIS — B964 Proteus (mirabilis) (morganii) as the cause of diseases classified elsewhere: Secondary | ICD-10-CM | POA: Diagnosis present

## 2017-08-15 DIAGNOSIS — J449 Chronic obstructive pulmonary disease, unspecified: Secondary | ICD-10-CM | POA: Diagnosis present

## 2017-08-15 DIAGNOSIS — Z888 Allergy status to other drugs, medicaments and biological substances status: Secondary | ICD-10-CM

## 2017-08-15 DIAGNOSIS — G9341 Metabolic encephalopathy: Secondary | ICD-10-CM | POA: Diagnosis present

## 2017-08-15 DIAGNOSIS — K644 Residual hemorrhoidal skin tags: Secondary | ICD-10-CM | POA: Diagnosis present

## 2017-08-15 DIAGNOSIS — Z96651 Presence of right artificial knee joint: Secondary | ICD-10-CM | POA: Diagnosis present

## 2017-08-15 DIAGNOSIS — N179 Acute kidney failure, unspecified: Secondary | ICD-10-CM | POA: Diagnosis present

## 2017-08-15 DIAGNOSIS — E785 Hyperlipidemia, unspecified: Secondary | ICD-10-CM | POA: Diagnosis present

## 2017-08-15 DIAGNOSIS — J90 Pleural effusion, not elsewhere classified: Secondary | ICD-10-CM | POA: Diagnosis present

## 2017-08-15 DIAGNOSIS — A4151 Sepsis due to Escherichia coli [E. coli]: Secondary | ICD-10-CM | POA: Diagnosis present

## 2017-08-15 DIAGNOSIS — Z7982 Long term (current) use of aspirin: Secondary | ICD-10-CM

## 2017-08-15 DIAGNOSIS — R410 Disorientation, unspecified: Secondary | ICD-10-CM | POA: Diagnosis present

## 2017-08-15 DIAGNOSIS — K610 Anal abscess: Secondary | ICD-10-CM | POA: Diagnosis present

## 2017-08-15 DIAGNOSIS — E7800 Pure hypercholesterolemia, unspecified: Secondary | ICD-10-CM | POA: Diagnosis present

## 2017-08-15 DIAGNOSIS — Z961 Presence of intraocular lens: Secondary | ICD-10-CM | POA: Diagnosis present

## 2017-08-15 DIAGNOSIS — R6521 Severe sepsis with septic shock: Secondary | ICD-10-CM

## 2017-08-15 DIAGNOSIS — G2581 Restless legs syndrome: Secondary | ICD-10-CM | POA: Diagnosis present

## 2017-08-15 DIAGNOSIS — N183 Chronic kidney disease, stage 3 (moderate): Secondary | ICD-10-CM | POA: Diagnosis present

## 2017-08-15 DIAGNOSIS — Z6837 Body mass index (BMI) 37.0-37.9, adult: Secondary | ICD-10-CM

## 2017-08-15 DIAGNOSIS — Z9889 Other specified postprocedural states: Secondary | ICD-10-CM | POA: Insufficient documentation

## 2017-08-15 DIAGNOSIS — Z803 Family history of malignant neoplasm of breast: Secondary | ICD-10-CM | POA: Insufficient documentation

## 2017-08-15 DIAGNOSIS — Z9981 Dependence on supplemental oxygen: Secondary | ICD-10-CM

## 2017-08-15 DIAGNOSIS — M1990 Unspecified osteoarthritis, unspecified site: Secondary | ICD-10-CM | POA: Diagnosis present

## 2017-08-15 DIAGNOSIS — N189 Chronic kidney disease, unspecified: Secondary | ICD-10-CM

## 2017-08-15 DIAGNOSIS — R600 Localized edema: Secondary | ICD-10-CM

## 2017-08-15 DIAGNOSIS — Z79899 Other long term (current) drug therapy: Secondary | ICD-10-CM

## 2017-08-15 DIAGNOSIS — F17210 Nicotine dependence, cigarettes, uncomplicated: Secondary | ICD-10-CM

## 2017-08-15 DIAGNOSIS — Z96611 Presence of right artificial shoulder joint: Secondary | ICD-10-CM | POA: Diagnosis present

## 2017-08-15 DIAGNOSIS — R338 Other retention of urine: Secondary | ICD-10-CM | POA: Diagnosis present

## 2017-08-15 DIAGNOSIS — N766 Ulceration of vulva: Secondary | ICD-10-CM | POA: Diagnosis present

## 2017-08-15 DIAGNOSIS — G4730 Sleep apnea, unspecified: Secondary | ICD-10-CM

## 2017-08-15 DIAGNOSIS — I129 Hypertensive chronic kidney disease with stage 1 through stage 4 chronic kidney disease, or unspecified chronic kidney disease: Secondary | ICD-10-CM | POA: Diagnosis present

## 2017-08-15 DIAGNOSIS — Z452 Encounter for adjustment and management of vascular access device: Secondary | ICD-10-CM

## 2017-08-15 DIAGNOSIS — Z515 Encounter for palliative care: Secondary | ICD-10-CM | POA: Diagnosis not present

## 2017-08-15 DIAGNOSIS — R770 Abnormality of albumin: Secondary | ICD-10-CM | POA: Diagnosis present

## 2017-08-15 DIAGNOSIS — I4891 Unspecified atrial fibrillation: Secondary | ICD-10-CM | POA: Diagnosis not present

## 2017-08-15 DIAGNOSIS — I872 Venous insufficiency (chronic) (peripheral): Secondary | ICD-10-CM

## 2017-08-15 DIAGNOSIS — E1122 Type 2 diabetes mellitus with diabetic chronic kidney disease: Secondary | ICD-10-CM | POA: Insufficient documentation

## 2017-08-15 DIAGNOSIS — Z881 Allergy status to other antibiotic agents status: Secondary | ICD-10-CM

## 2017-08-15 DIAGNOSIS — R339 Retention of urine, unspecified: Secondary | ICD-10-CM

## 2017-08-15 DIAGNOSIS — Z96619 Presence of unspecified artificial shoulder joint: Secondary | ICD-10-CM | POA: Insufficient documentation

## 2017-08-15 DIAGNOSIS — E669 Obesity, unspecified: Secondary | ICD-10-CM | POA: Diagnosis present

## 2017-08-15 DIAGNOSIS — K6131 Horseshoe abscess: Secondary | ICD-10-CM | POA: Diagnosis present

## 2017-08-15 DIAGNOSIS — G894 Chronic pain syndrome: Secondary | ICD-10-CM | POA: Diagnosis present

## 2017-08-15 DIAGNOSIS — Z794 Long term (current) use of insulin: Secondary | ICD-10-CM | POA: Diagnosis not present

## 2017-08-15 DIAGNOSIS — Z791 Long term (current) use of non-steroidal anti-inflammatories (NSAID): Secondary | ICD-10-CM

## 2017-08-15 DIAGNOSIS — A419 Sepsis, unspecified organism: Secondary | ICD-10-CM | POA: Diagnosis present

## 2017-08-15 DIAGNOSIS — E875 Hyperkalemia: Secondary | ICD-10-CM | POA: Diagnosis present

## 2017-08-15 DIAGNOSIS — Z79891 Long term (current) use of opiate analgesic: Secondary | ICD-10-CM | POA: Insufficient documentation

## 2017-08-15 DIAGNOSIS — R531 Weakness: Secondary | ICD-10-CM | POA: Diagnosis not present

## 2017-08-15 DIAGNOSIS — Z792 Long term (current) use of antibiotics: Secondary | ICD-10-CM

## 2017-08-15 DIAGNOSIS — Z90710 Acquired absence of both cervix and uterus: Secondary | ICD-10-CM

## 2017-08-15 DIAGNOSIS — R011 Cardiac murmur, unspecified: Secondary | ICD-10-CM | POA: Diagnosis present

## 2017-08-15 DIAGNOSIS — Z9842 Cataract extraction status, left eye: Secondary | ICD-10-CM

## 2017-08-15 DIAGNOSIS — M549 Dorsalgia, unspecified: Secondary | ICD-10-CM | POA: Diagnosis present

## 2017-08-15 DIAGNOSIS — J9611 Chronic respiratory failure with hypoxia: Secondary | ICD-10-CM | POA: Diagnosis present

## 2017-08-15 DIAGNOSIS — Z9841 Cataract extraction status, right eye: Secondary | ICD-10-CM

## 2017-08-15 DIAGNOSIS — Z7189 Other specified counseling: Secondary | ICD-10-CM | POA: Diagnosis not present

## 2017-08-15 DIAGNOSIS — Z7952 Long term (current) use of systemic steroids: Secondary | ICD-10-CM

## 2017-08-15 LAB — URINALYSIS, COMPLETE (UACMP) WITH MICROSCOPIC
Bilirubin Urine: NEGATIVE
Glucose, UA: NEGATIVE mg/dL
HGB URINE DIPSTICK: NEGATIVE
Ketones, ur: NEGATIVE mg/dL
Nitrite: NEGATIVE
PROTEIN: 100 mg/dL — AB
Specific Gravity, Urine: 1.014 (ref 1.005–1.030)
WBC, UA: >50 WBC/hpf — ABNORMAL HIGH (ref 0–5)
pH: 8 (ref 5.0–8.0)

## 2017-08-15 LAB — CBC WITH DIFFERENTIAL/PLATELET
BASOS ABS: 0.1 10*3/uL (ref 0–0.1)
BASOS PCT: 0 %
Basophils Absolute: 0.1 10*3/uL (ref 0–0.1)
Basophils Relative: 0 %
EOS ABS: 0 10*3/uL (ref 0–0.7)
Eosinophils Absolute: 0 10*3/uL (ref 0–0.7)
Eosinophils Relative: 0 %
Eosinophils Relative: 0 %
HCT: 32.3 % — ABNORMAL LOW (ref 35.0–47.0)
HCT: 34.3 % — ABNORMAL LOW (ref 35.0–47.0)
HEMOGLOBIN: 10.1 g/dL — AB (ref 12.0–16.0)
Hemoglobin: 11 g/dL — ABNORMAL LOW (ref 12.0–16.0)
LYMPHS PCT: 1 %
Lymphocytes Relative: 1 %
Lymphs Abs: 0.3 10*3/uL — ABNORMAL LOW (ref 1.0–3.6)
Lymphs Abs: 0.3 10*3/uL — ABNORMAL LOW (ref 1.0–3.6)
MCH: 30.5 pg (ref 26.0–34.0)
MCH: 30.9 pg (ref 26.0–34.0)
MCHC: 31.4 g/dL — ABNORMAL LOW (ref 32.0–36.0)
MCHC: 32 g/dL (ref 32.0–36.0)
MCV: 97 fL (ref 80.0–100.0)
MCV: 96.4 fL (ref 80.0–100.0)
Monocytes Absolute: 0.9 10*3/uL (ref 0.2–0.9)
Monocytes Absolute: 1 10*3/uL — ABNORMAL HIGH (ref 0.2–0.9)
Monocytes Relative: 3 %
Monocytes Relative: 3 %
NEUTROS ABS: 25 10*3/uL — AB (ref 1.4–6.5)
NEUTROS PCT: 96 %
Neutro Abs: 28 10*3/uL — ABNORMAL HIGH (ref 1.4–6.5)
Neutrophils Relative %: 96 %
Platelets: 242 10*3/uL (ref 150–440)
Platelets: 271 10*3/uL (ref 150–440)
RBC: 3.33 MIL/uL — AB (ref 3.80–5.20)
RBC: 3.56 MIL/uL — AB (ref 3.80–5.20)
RDW: 18.7 % — ABNORMAL HIGH (ref 11.5–14.5)
RDW: 19.3 % — ABNORMAL HIGH (ref 11.5–14.5)
WBC: 26.2 10*3/uL — AB (ref 3.6–11.0)
WBC: 29.4 10*3/uL — AB (ref 3.6–11.0)

## 2017-08-15 LAB — COMPREHENSIVE METABOLIC PANEL
ALBUMIN: 2.6 g/dL — AB (ref 3.5–5.0)
ALT: 18 U/L (ref 14–54)
ANION GAP: 9 (ref 5–15)
AST: 44 U/L — ABNORMAL HIGH (ref 15–41)
Alkaline Phosphatase: 104 U/L (ref 38–126)
BUN: 55 mg/dL — ABNORMAL HIGH (ref 6–20)
CHLORIDE: 98 mmol/L — AB (ref 101–111)
CO2: 25 mmol/L (ref 22–32)
CREATININE: 1.97 mg/dL — AB (ref 0.44–1.00)
Calcium: 6.2 mg/dL — CL (ref 8.9–10.3)
GFR calc non Af Amer: 23 mL/min — ABNORMAL LOW (ref >60)
GFR, EST AFRICAN AMERICAN: 27 mL/min — AB (ref >60)
Glucose, Bld: 130 mg/dL — ABNORMAL HIGH (ref 65–99)
Potassium: 4.8 mmol/L (ref 3.5–5.1)
SODIUM: 132 mmol/L — AB (ref 135–145)
Total Bilirubin: 0.7 mg/dL (ref 0.3–1.2)
Total Protein: 6 g/dL — ABNORMAL LOW (ref 6.5–8.1)

## 2017-08-15 LAB — BLOOD GAS, ARTERIAL
ACID-BASE EXCESS: 0.3 mmol/L (ref 0.0–2.0)
Bicarbonate: 26 mmol/L (ref 20.0–28.0)
FIO2: 0.36
O2 Saturation: 87.4 %
PATIENT TEMPERATURE: 37
pCO2 arterial: 46 mmHg (ref 32.0–48.0)
pH, Arterial: 7.36 (ref 7.350–7.450)
pO2, Arterial: 56 mmHg — ABNORMAL LOW (ref 83.0–108.0)

## 2017-08-15 LAB — GLUCOSE, CAPILLARY
Glucose-Capillary: 126 mg/dL — ABNORMAL HIGH (ref 65–99)
Glucose-Capillary: 156 mg/dL — ABNORMAL HIGH (ref 65–99)
Glucose-Capillary: 174 mg/dL — ABNORMAL HIGH (ref 65–99)

## 2017-08-15 LAB — MAGNESIUM: MAGNESIUM: 2.2 mg/dL (ref 1.7–2.4)

## 2017-08-15 LAB — LACTIC ACID, PLASMA
LACTIC ACID, VENOUS: 1.2 mmol/L (ref 0.5–1.9)
Lactic Acid, Venous: 0.9 mmol/L (ref 0.5–1.9)

## 2017-08-15 LAB — TROPONIN I: Troponin I: 0.04 ng/mL (ref <0.03)

## 2017-08-15 LAB — BRAIN NATRIURETIC PEPTIDE: B Natriuretic Peptide: 176 pg/mL — ABNORMAL HIGH (ref 0.0–100.0)

## 2017-08-15 MED ORDER — CALCIUM GLUCONATE 10 % IV SOLN
1.0000 g | Freq: Once | INTRAVENOUS | Status: AC
  Administered 2017-08-15: 1 g via INTRAVENOUS
  Filled 2017-08-15: qty 10

## 2017-08-15 MED ORDER — SODIUM CHLORIDE 0.9 % IV BOLUS
250.0000 mL | Freq: Once | INTRAVENOUS | Status: AC
  Administered 2017-08-15: 250 mL via INTRAVENOUS

## 2017-08-15 MED ORDER — MOMETASONE FURO-FORMOTEROL FUM 200-5 MCG/ACT IN AERO
2.0000 | INHALATION_SPRAY | Freq: Two times a day (BID) | RESPIRATORY_TRACT | Status: DC
  Administered 2017-08-15 – 2017-08-26 (×22): 2 via RESPIRATORY_TRACT
  Filled 2017-08-15: qty 8.8

## 2017-08-15 MED ORDER — SODIUM CHLORIDE 0.9 % IV SOLN
500.0000 mg | Freq: Two times a day (BID) | INTRAVENOUS | Status: DC
  Administered 2017-08-15 – 2017-08-18 (×6): 500 mg via INTRAVENOUS
  Filled 2017-08-15: qty 500
  Filled 2017-08-15: qty 0.5
  Filled 2017-08-15 (×2): qty 500
  Filled 2017-08-15 (×2): qty 0.5
  Filled 2017-08-15 (×2): qty 500
  Filled 2017-08-15: qty 0.5

## 2017-08-15 MED ORDER — ACETAMINOPHEN 325 MG PO TABS
650.0000 mg | ORAL_TABLET | Freq: Four times a day (QID) | ORAL | Status: DC | PRN
  Administered 2017-08-17: 650 mg via ORAL
  Filled 2017-08-15: qty 2

## 2017-08-15 MED ORDER — ALLOPURINOL 100 MG PO TABS
200.0000 mg | ORAL_TABLET | Freq: Every day | ORAL | Status: DC
  Administered 2017-08-16 – 2017-08-26 (×11): 200 mg via ORAL
  Filled 2017-08-15 (×11): qty 2

## 2017-08-15 MED ORDER — PRAMIPEXOLE DIHYDROCHLORIDE 0.25 MG PO TABS
1.0000 mg | ORAL_TABLET | Freq: Every day | ORAL | Status: DC
  Administered 2017-08-16 – 2017-08-25 (×10): 1 mg via ORAL
  Filled 2017-08-15 (×9): qty 4

## 2017-08-15 MED ORDER — INSULIN ASPART 100 UNIT/ML ~~LOC~~ SOLN: 0.0000 [IU] | Freq: Three times a day (TID) | SUBCUTANEOUS | Status: DC

## 2017-08-15 MED ORDER — PIPERACILLIN-TAZOBACTAM 3.375 G IVPB 30 MIN
3.3750 g | Freq: Once | INTRAVENOUS | Status: AC
  Administered 2017-08-15: 3.375 g via INTRAVENOUS
  Filled 2017-08-15: qty 50

## 2017-08-15 MED ORDER — SODIUM CHLORIDE 0.9 % IV SOLN
INTRAVENOUS | Status: AC
  Filled 2017-08-15: qty 10

## 2017-08-15 MED ORDER — GUAIFENESIN ER 600 MG PO TB12: 600.0000 mg | ORAL_TABLET | Freq: Two times a day (BID) | ORAL | Status: DC | PRN

## 2017-08-15 MED ORDER — ASPIRIN EC 81 MG PO TBEC
81.0000 mg | DELAYED_RELEASE_TABLET | Freq: Every day | ORAL | Status: DC
  Administered 2017-08-16 – 2017-08-21 (×6): 81 mg via ORAL
  Filled 2017-08-15 (×6): qty 1

## 2017-08-15 MED ORDER — ONDANSETRON HCL 4 MG PO TABS: 4.0000 mg | ORAL_TABLET | Freq: Four times a day (QID) | ORAL | Status: DC | PRN

## 2017-08-15 MED ORDER — FLUTICASONE PROPIONATE 50 MCG/ACT NA SUSP
2.0000 | Freq: Every day | NASAL | Status: DC | PRN
  Filled 2017-08-15: qty 16

## 2017-08-15 MED ORDER — IPRATROPIUM-ALBUTEROL 0.5-2.5 (3) MG/3ML IN SOLN
3.0000 mL | Freq: Four times a day (QID) | RESPIRATORY_TRACT | Status: DC
  Administered 2017-08-15 – 2017-08-16 (×3): 3 mL via RESPIRATORY_TRACT
  Filled 2017-08-15 (×3): qty 3

## 2017-08-15 MED ORDER — NOREPINEPHRINE 4 MG/250ML-% IV SOLN
INTRAVENOUS | Status: AC
  Administered 2017-08-15: 19 ug/min via INTRAVENOUS
  Filled 2017-08-15: qty 250

## 2017-08-15 MED ORDER — NOREPINEPHRINE 16 MG/250ML-% IV SOLN
0.0000 ug/min | INTRAVENOUS | Status: DC
  Administered 2017-08-15: 30 ug/min via INTRAVENOUS
  Administered 2017-08-16: 14 ug/min via INTRAVENOUS
  Filled 2017-08-15 (×2): qty 250

## 2017-08-15 MED ORDER — THEOPHYLLINE ER 200 MG PO CP24
400.0000 mg | ORAL_CAPSULE | Freq: Every day | ORAL | Status: DC
  Filled 2017-08-15 (×3): qty 2

## 2017-08-15 MED ORDER — ACETAMINOPHEN 650 MG RE SUPP: 650.0000 mg | Freq: Four times a day (QID) | RECTAL | Status: DC | PRN

## 2017-08-15 MED ORDER — SODIUM CHLORIDE 0.9 % IV SOLN
INTRAVENOUS | Status: DC
  Administered 2017-08-15: 15:00:00 via INTRAVENOUS

## 2017-08-15 MED ORDER — ALBUTEROL SULFATE HFA 108 (90 BASE) MCG/ACT IN AERS: 2.0000 | INHALATION_SPRAY | Freq: Four times a day (QID) | RESPIRATORY_TRACT | Status: DC | PRN

## 2017-08-15 MED ORDER — DICLOFENAC EPOLAMINE 1.3 % TD PTCH
1.0000 | MEDICATED_PATCH | Freq: Every day | TRANSDERMAL | Status: DC | PRN
  Filled 2017-08-15 (×2): qty 1

## 2017-08-15 MED ORDER — NOREPINEPHRINE 4 MG/250ML-% IV SOLN
19.0000 ug/min | Freq: Once | INTRAVENOUS | Status: AC
  Administered 2017-08-15: 19 ug/min via INTRAVENOUS

## 2017-08-15 MED ORDER — SODIUM CHLORIDE 0.9 % IV SOLN
INTRAVENOUS | Status: DC
  Administered 2017-08-15 – 2017-08-16 (×2): via INTRAVENOUS

## 2017-08-15 MED ORDER — HYDROCORTISONE 2.5 % RE CREA
TOPICAL_CREAM | Freq: Three times a day (TID) | RECTAL | Status: DC
  Administered 2017-08-15 – 2017-08-20 (×10): via RECTAL
  Filled 2017-08-15: qty 28.35

## 2017-08-15 MED ORDER — EZETIMIBE 10 MG PO TABS
10.0000 mg | ORAL_TABLET | Freq: Every day | ORAL | Status: DC
  Administered 2017-08-16 – 2017-08-25 (×10): 10 mg via ORAL
  Filled 2017-08-15 (×12): qty 1

## 2017-08-15 MED ORDER — ENOXAPARIN SODIUM 30 MG/0.3ML ~~LOC~~ SOLN
30.0000 mg | SUBCUTANEOUS | Status: DC
  Administered 2017-08-15: 30 mg via SUBCUTANEOUS
  Filled 2017-08-15: qty 0.3

## 2017-08-15 MED ORDER — FELODIPINE ER 10 MG PO TB24
10.0000 mg | ORAL_TABLET | Freq: Every day | ORAL | Status: DC
  Filled 2017-08-15 (×2): qty 1

## 2017-08-15 MED ORDER — TIOTROPIUM BROMIDE MONOHYDRATE 18 MCG IN CAPS: 18.0000 ug | ORAL_CAPSULE | Freq: Every day | RESPIRATORY_TRACT | Status: DC

## 2017-08-15 MED ORDER — PANTOPRAZOLE SODIUM 40 MG PO TBEC
40.0000 mg | DELAYED_RELEASE_TABLET | Freq: Every day | ORAL | Status: DC
  Administered 2017-08-16 – 2017-08-26 (×11): 40 mg via ORAL
  Filled 2017-08-15 (×11): qty 1

## 2017-08-15 MED ORDER — ONDANSETRON HCL 4 MG/2ML IJ SOLN
4.0000 mg | Freq: Four times a day (QID) | INTRAMUSCULAR | Status: DC | PRN
  Administered 2017-08-19 – 2017-08-20 (×4): 4 mg via INTRAVENOUS
  Filled 2017-08-15 (×4): qty 2

## 2017-08-15 MED ORDER — VITAMIN D (ERGOCALCIFEROL) 1.25 MG (50000 UNIT) PO CAPS
50000.0000 [IU] | ORAL_CAPSULE | ORAL | Status: DC
  Filled 2017-08-15: qty 1

## 2017-08-15 MED ORDER — NOREPINEPHRINE 4 MG/250ML-% IV SOLN
INTRAVENOUS | Status: AC
  Filled 2017-08-15: qty 250

## 2017-08-15 NOTE — ED Notes (Addendum)
Pt given three 250cc boluses for a total of 750cc of fluids prior to a 20cc/hr KVO dose (see MAR) due to extreme peripheral edema. Levophed started (see MAR) vs. overloading with more fluids, per EDP.   Second bag of levophed overridden from pyxis d/t almost empty bag in room.

## 2017-08-15 NOTE — ED Provider Notes (Addendum)
[facility] ____________________________________________  Time seen: Approximately 12:31 PM  I have reviewed the triage vital signs and the nursing notes.   HISTORY  Chief Complaint Urinary Retention   HPI Brenda Glenn is a 77 y.o. female past medical history of COPD on supplemental oxygen, diabetes, hypertension, hyperlipidemia, chronic renal insufficiency, chronic lower extremity edema, presenting with daughter at bedside for evaluation of acute urinary retention since yesterday.  Patient daughter reports last void was around 2:00 yesterday, but reports she has since felt the need to urinate but unable to do so.  Denies history of similar in the past.  Continues to drink fluids well.     also reported that over the last 1 week patient has had intermittent visual hallucinations, stating that she is seeing people there that are not.  Daughter also reports that patient chronically has episodes of her dozing off, but reports over the last 2 days that has increased.  As well as increase bilateral lower extremity edema compared to baseline. Intermittent shortness of breath, reports that is chronic, but slightly worse over the last few days.  States recent decrease of blood pressure medicines by her primary care, taken her off of losartan, and a recent increase of gabapentin.  Denies other recent medical changes.  Mickey Farber, MD: PCP   Past Medical History:  Diagnosis Date  . Arthritis    all over  . Back pain   . COPD (chronic obstructive pulmonary disease) (HCC)    O2 use continuosly at 2 liters/ some  asthma symptoms  . Diabetes mellitus without complication (HCC)    diet controlled  . GERD (gastroesophageal reflux disease)   . Heart murmur    lifelong  . Hypercholesterolemia   . Hypertension    controlled on meds  . Pneumonia 2015   [facility] hospitalized  . Renal insufficiency    early stage kidney failure  . Shortness of breath dyspnea   . Sleep apnea    2nd  sleep study said pt does not have sleep apnea  . Swelling    of feet and legs    Patient Active Problem List   Diagnosis Date Noted  . Bilateral lower extremity edema 07/28/2017  . Lower extremity numbness 07/28/2017  . ARF (acute renal failure) (HCC) 06/20/2017  . 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 Lymphedema 01/06/2016  . Chronic venous insufficiency 01/06/2016  . Pain in limb 01/06/2016  . COPD (chronic obstructive pulmonary disease) (HCC) 01/06/2016  . Abnormality of gait 01/06/2016    Past Surgical History:  Procedure Laterality Date  . ABDOMINAL HYSTERECTOMY  1988  . BACK SURGERY  1978 and 1995   residual nerve damage legs-weakness  . BLADDER SURGERY  2017  . CATARACT EXTRACTION Right   . CATARACT EXTRACTION W/PHACO Left 09/05/2014   Procedure: CATARACT EXTRACTION PHACO AND INTRAOCULAR LENS PLACEMENT (IOC);  Surgeon: Lockie Mola, MD;  Location: [facility];  Service: Ophthalmology;  Laterality: Left;  DIABETIC  . CHOLECYSTECTOMY  2011  . JOINT REPLACEMENT Right 2009  . REPLACEMENT TOTAL KNEE Right   . REVERSE SHOULDER ARTHROPLASTY Right 01/08/2017   Procedure: REVERSE SHOULDER ARTHROPLASTY;  Surgeon: Signa Kell, MD;  Location: [facility];  Service: Orthopedics;  Laterality: Right;     No current facility-administered medications for this encounter.   Current Outpatient Medications:  .  albuterol (PROVENTIL HFA;VENTOLIN HFA) 108 (90 Base) MCG/ACT inhaler, Inhale 2 puffs into the lungs every 6 (six) hours as needed for wheezing or shortness of  breath., Disp: , Rfl:  .  allopurinol (ZYLOPRIM) 100 MG tablet, Take 200 mg by mouth daily. In am., Disp: , Rfl:  .  aspirin EC 81 MG tablet, Take by mouth., Disp: , Rfl:  .  diclofenac (FLECTOR) 1.3 % PTCH, Place 1 patch onto the skin daily as needed (for pain)., Disp: , Rfl:  .  doxycycline (VIBRAMYCIN) 100 MG capsule, Take 100 mg by mouth 2 (two) times daily., Disp: , Rfl:  .  ezetimibe (ZETIA) 10 MG  tablet, Take 10 mg by mouth at bedtime., Disp: , Rfl:  .  felodipine (PLENDIL) 10 MG 24 hr tablet, Take 10 mg by mouth at bedtime., Disp: , Rfl:  .  fluticasone (FLONASE) 50 MCG/ACT nasal spray, Place 2 sprays into both nostrils daily as needed for allergies., Disp: , Rfl:  .  fluticasone-salmeterol (ADVAIR HFA) 115-21 MCG/ACT inhaler, Inhale 2 puffs into the lungs 2 (two) times daily. , Disp: , Rfl:  .  furosemide (LASIX) 20 MG tablet, Take 40 mg by mouth daily. , Disp: , Rfl:  .  gabapentin (NEURONTIN) 100 MG capsule, Take 100 mg by mouth 3 (three) times daily., Disp: , Rfl: 0 .  meloxicam (MOBIC) 15 MG tablet, TK 1 T PO ONCE D, Disp: , Rfl: 1 .  metoprolol succinate (TOPROL-XL) 100 MG 24 hr tablet, Take 100 mg by mouth daily. Take with or immediately following a meal.  In am., Disp: , Rfl:  .  nicotine (NICODERM CQ - DOSED IN MG/24 HOURS) 14 mg/24hr patch, APP 1 PA EXT TO THE SKIN D, Disp: , Rfl: 0 .  nystatin (MYCOSTATIN) 100000 UNIT/ML suspension, Take 5 mLs by mouth 4 (four) times daily as needed (for trush). , Disp: , Rfl:  .  omeprazole (PRILOSEC) 40 MG capsule, Take 40 mg by mouth daily. , Disp: , Rfl:  .  oxyCODONE (ROXICODONE) 15 MG immediate release tablet, Take 15 mg by mouth every 4 (four) hours. , Disp: , Rfl:  .  pramipexole (MIRAPEX) 0.5 MG tablet, Take 1 mg by mouth at bedtime. , Disp: , Rfl:  .  promethazine (PHENERGAN) 25 MG tablet, Take 25 mg by mouth every 6 (six) hours as needed for nausea or vomiting., Disp: , Rfl:  .  theophylline (THEO-24) 400 MG 24 hr capsule, Take 400 mg by mouth daily. In am., Disp: , Rfl:  .  tiotropium (SPIRIVA) 18 MCG inhalation capsule, Place 18 mcg into inhaler and inhale daily. 2 puffs daily., Disp: , Rfl:  .  triamcinolone cream (KENALOG) 0.1 %, Apply 1 application topically 2 (two) times daily as needed (for rash)., Disp: , Rfl:  .  valACYclovir (VALTREX) 500 MG tablet, Take 500 mg by mouth 2 (two) times daily as needed (fever blisters). , Disp:  , Rfl: 2 .  Vitamin D, Ergocalciferol, (DRISDOL) 50000 UNITS CAPS capsule, Take 50,000 Units by mouth every 14 (fourteen) days., Disp: , Rfl:  .  guaiFENesin (MUCINEX) 600 MG 12 hr tablet, Take 600 mg by mouth every 12 (twelve) hours as needed for cough., Disp: , Rfl:  .  losartan (COZAAR) 100 MG tablet, Take 100 mg by mouth daily. In am, Disp: , Rfl:   Allergies Tequin [gatifloxacin]; Bupropion; Keflex [cephalexin]; Lisinopril; and Varenicline  Family History  Problem Relation Age of Onset  . Breast cancer Mother 60  . Breast cancer Paternal Grandmother 18    Social History Social History   Tobacco Use  . Smoking status: Current Every Day Smoker  Packs/day: 0.25    Years: 50.00    Pack years: 12.50    Types: Cigarettes  . Smokeless tobacco: Never Used  Substance Use Topics  . Alcohol use: Yes    Alcohol/week: 1.8 oz    Types: 3 Glasses of wine per week    Comment: rarely  . Drug use: No    Review of Systems Constitutional: No fever/chills Eyes AS above. Cardiovascular: Denies chest pain. Respiratory: AS above.  Gastrointestinal: As above.  Reports some abdomen feels swollen. Genitourinary: Negative for dysuria. Musculoskeletal: Negative for back pain. Skin: Negative for rash. Neurological: Negative for focal weakness or numbness. As above.    ____________________________________________   PHYSICAL EXAM:  VITAL SIGNS: ED Triage Vitals  Enc Vitals Group     BP 08/15/17 1220 (!) 78/63     Pulse Rate 08/15/17 1220 96     Resp 08/15/17 1220 16     Temp 08/15/17 1220 98.1 F (36.7 C)     Temp Source 08/15/17 1220 Oral     SpO2 08/15/17 1220 93 %     Weight 08/15/17 1224 179 lb (81.2 kg)     Height --      Head Circumference --      Peak Flow --      Pain Score 08/15/17 1224 8     Pain Loc --      Pain Edu? --      Excl. in GC? --     Constitutional: Alert and oriented.  ENT      Head: Normocephalic and atraumatic.      Nose: No  congestion/rhinnorhea.      Mouth/Throat: Mucous membranes are moist.  Cardiovascular: Normal rate, regular rhythm. Grossly normal heart sounds.  Good peripheral circulation. Respiratory: Normal respiratory effort without tachypnea nor retractions.  Decreased bilateral base breath sounds.  No wheezes.  Mild scattered rhonchi.  On oxygen by nasal cannula. Gastrointestinal: Obese abdomen.  Suprapubic pressure and lower abdomen pressure, abdomen otherwise soft and nontender. Musculoskeletal: Bilateral lower extremities 2+ pitting edema.  Unable to manually palpate bilateral dorsalis pedis and posterior tibialis pulses. Mild edema right upper extremity diffuse. Neurologic:  Normal speech and language, patient dozing off during exam, see relation.  Quickly wakes up with verbal Skin:  Skin is warm, dry.    ___________________________________________   LABS (all labs ordered are listed, but only abnormal results are displayed)  Labs Reviewed  GLUCOSE, CAPILLARY - Abnormal; Notable for the following components:      Result Value   Glucose-Capillary 126 (*)    All other components within normal limits    RADIOLOGY  No results found. ____________________________________________   PROCEDURES Procedures   INITIAL IMPRESSION / ASSESSMENT AND PLAN / ED COURSE  Pertinent labs & imaging results that were available during my care of the patient were reviewed by me and considered in my medical decision making (see chart for details).  Patient with extensive past medical history and multiple comorbidities, presenting for acute urinary retention, increased lower extremity edema as well as change in mental status.  Patient hypotensive in urgent care.  Recommend further evaluation in emergency room at this time, recommend EMS transfer, patient and daughter agreed to plan.  Brandi RN Press [facility]charge nurse at [facility] called and given report.   ____________________________________________   FINAL CLINICAL  IMPRESSION(S) / ED DIAGNOSES  Final diagnoses:  Urinary retention  Bilateral lower extremity edema  Visual disturbance     ED Discharge Orders    None  Note: This dictation was prepared with Dragon dictation along with smaller phrase technology. Any transcriptional errors that result from this process are unintentional.           Renford Dills, NP 08/15/17 1359

## 2017-08-15 NOTE — Progress Notes (Signed)
CODE SEPSIS - PHARMACY COMMUNICATION  **Broad Spectrum Antibiotics should be administered within 1 hour of Sepsis diagnosis**  Time Code Sepsis Called/Page Received: 1661  Antibiotics Ordered: Zosyn  Time of 1st antibiotic administration:  1507  Additional action taken by pharmacy: none required  If necessary, Name of Provider/Nurse Contacted: N/A    Lowella Bandyodney D Aviannah Castoro ,PharmD Clinical Pharmacist  08/15/2017  4:46 PM

## 2017-08-15 NOTE — ED Provider Notes (Signed)
Radiology calls back about the central line x-ray they are concerned about the anatomy.  Dr. Roxan Hockeyobinson checks the blood flow volume in the room.  The blood flow is good it is not pulsatile.  The line is in good position.  Clinically.  There is infusing properly.  The patient is better.   Brenda Glenn, Brenda Grogan F, MD 08/15/17 [phone]

## 2017-08-15 NOTE — H&P (Signed)
[facility] - [place] at [facility]   PATIENT NAME: Brenda Glenn    MR#:  [id number]  DATE OF BIRTH:  [birth year]  DATE OF ADMISSION:  08/15/2017  PRIMARY CARE PHYSICIAN: Mickey Farber, MD   REQUESTING/REFERRING PHYSICIAN: Arnaldo Natal, MD  CHIEF COMPLAINT:   Chief Complaint  Patient presents with  . Urinary Retention  . Multiple Complaints    HISTORY OF PRESENT ILLNESS: Brenda Glenn  is a 77 y.o. female with a known history of osteoarthritis, COPD on 2 L of oxygen, diabetes type 2, GERD, essential hypertension, hypercholesteremia chronic lower extremity swelling presenting to the emergency room with complaint of urinary retention and feeling very tired and weak.  The symptoms have been going on for more than 2 weeks.  Patient's evaluation in the ER shows she has a urinary tract infection and signs of sepsis.  Patient also noticed to have hypotension receiving fluid boluses.  She denies any chest pain has chronic shortness of breath and cough which is unchanged.  Denies any nausea vomiting or diarrhea.  Complains of burning with urination and urinary retention.    PAST MEDICAL HISTORY:   Past Medical History:  Diagnosis Date  . Arthritis    all over  . Back pain   . COPD (chronic obstructive pulmonary disease) (HCC)    O2 use continuosly at 2 liters/ some  asthma symptoms  . Diabetes mellitus without complication (HCC)    diet controlled  . GERD (gastroesophageal reflux disease)   . Heart murmur    lifelong  . Hypercholesterolemia   . Hypertension    controlled on meds  . Pneumonia 2015   [facility] hospitalized  . Renal insufficiency    early stage kidney failure  . Shortness of breath dyspnea   . Sleep apnea    2nd sleep study said pt does not have sleep apnea  . Swelling    of feet and legs    PAST SURGICAL HISTORY:  Past Surgical History:  Procedure Laterality Date  . ABDOMINAL HYSTERECTOMY  1988  . BACK SURGERY  1978 and 1995   residual nerve damage  legs-weakness  . BLADDER SURGERY  2017  . CATARACT EXTRACTION Right   . CATARACT EXTRACTION W/PHACO Left 09/05/2014   Procedure: CATARACT EXTRACTION PHACO AND INTRAOCULAR LENS PLACEMENT (IOC);  Surgeon: Lockie Mola, MD;  Location: [facility];  Service: Ophthalmology;  Laterality: Left;  DIABETIC  . CHOLECYSTECTOMY  2011  . JOINT REPLACEMENT Right 2009  . REPLACEMENT TOTAL KNEE Right   . REVERSE SHOULDER ARTHROPLASTY Right 01/08/2017   Procedure: REVERSE SHOULDER ARTHROPLASTY;  Surgeon: Signa Kell, MD;  Location: [facility];  Service: Orthopedics;  Laterality: Right;    SOCIAL HISTORY:  Social History   Tobacco Use  . Smoking status: Current Every Day Smoker    Packs/day: 0.25    Years: 50.00    Pack years: 12.50    Types: Cigarettes  . Smokeless tobacco: Never Used  Substance Use Topics  . Alcohol use: Yes    Alcohol/week: 1.8 oz    Types: 3 Glasses of wine per week    Comment: rarely    FAMILY HISTORY:  Family History  Problem Relation Age of Onset  . Breast cancer Mother 48  . Breast cancer Paternal Grandmother 1    DRUG ALLERGIES:  Allergies  Allergen Reactions  . Tequin [Gatifloxacin] Shortness Of Breath  . Bupropion     Other reaction(s): Other (See Comments) shaking  . Keflex [Cephalexin] Other (  See Comments)    Unknown  . Lisinopril Other (See Comments)    Unknown  . Varenicline Nausea Only and Nausea And Vomiting    REVIEW OF SYSTEMS:   CONSTITUTIONAL: No fever, positive fatigue or positive weakness.  EYES: No blurred or double vision.  EARS, NOSE, AND THROAT: No tinnitus or ear pain.  RESPIRATORY: No cough, shortness of breath, wheezing or hemoptysis.  CARDIOVASCULAR: No chest pain, orthopnea, edema.  GASTROINTESTINAL: No nausea, vomiting, diarrhea or abdominal pain.  GENITOURINARY: Positive dysuria, hematuria.  Positive urinary retention ENDOCRINE: No polyuria, nocturia,  HEMATOLOGY: No anemia, easy bruising or bleeding SKIN: No  rash or lesion. MUSCULOSKELETAL: No joint pain or arthritis.   NEUROLOGIC: No tingling, numbness, weakness.  PSYCHIATRY: No anxiety or depression.   MEDICATIONS AT HOME:  Prior to Admission medications   Medication Sig Start Date End Date Taking? Authorizing Provider  albuterol (PROVENTIL HFA;VENTOLIN HFA) 108 (90 Base) MCG/ACT inhaler Inhale 2 puffs into the lungs every 6 (six) hours as needed for wheezing or shortness of breath.   Yes [provider]  allopurinol (ZYLOPRIM) 100 MG tablet Take 200 mg by mouth daily. In am.   Yes [provider]  aspirin EC 81 MG tablet Take 81 mg by mouth daily.    Yes [provider]  diclofenac (FLECTOR) 1.3 % PTCH Place 1 patch onto the skin daily as needed (for pain).   Yes [provider]  ezetimibe (ZETIA) 10 MG tablet Take 10 mg by mouth at bedtime.   Yes [provider]  felodipine (PLENDIL) 10 MG 24 hr tablet Take 10 mg by mouth at bedtime.   Yes [provider]  fluticasone (FLONASE) 50 MCG/ACT nasal spray Place 2 sprays into both nostrils daily as needed for allergies. 12/08/16  Yes [provider]  fluticasone-salmeterol (ADVAIR HFA) 115-21 MCG/ACT inhaler Inhale 2 puffs into the lungs 2 (two) times daily.    Yes [provider]  furosemide (LASIX) 20 MG tablet Take 40 mg by mouth daily.    Yes [provider]  gabapentin (NEURONTIN) 100 MG capsule Take 100 mg by mouth 3 (three) times daily. 06/09/17  Yes [provider]  metoprolol succinate (TOPROL-XL) 100 MG 24 hr tablet Take 100 mg by mouth daily. Take with or immediately following a meal.  In am.   Yes [provider]  omeprazole (PRILOSEC) 40 MG capsule Take 40 mg by mouth daily.    Yes [provider]  pramipexole (MIRAPEX) 0.5 MG tablet Take 1 mg by mouth at bedtime.    Yes [provider]  theophylline (THEO-24) 400 MG 24 hr capsule Take 400 mg by mouth daily. In am.   Yes  [provider]  tiotropium (SPIRIVA) 18 MCG inhalation capsule Place 18 mcg into inhaler and inhale daily. 2 puffs daily.   Yes [provider]  triamcinolone cream (KENALOG) 0.1 % Apply 1 application topically 2 (two) times daily as needed (for rash).   Yes [provider]  Vitamin D, Ergocalciferol, (DRISDOL) 50000 UNITS CAPS capsule Take 50,000 Units by mouth every 14 (fourteen) days.   Yes [provider]  doxycycline (VIBRAMYCIN) 100 MG capsule Take 100 mg by mouth 2 (two) times daily.    [provider]  guaiFENesin (MUCINEX) 600 MG 12 hr tablet Take 600 mg by mouth every 12 (twelve) hours as needed for cough.    [provider]  nicotine (NICODERM CQ - DOSED IN MG/24 HOURS) 14 mg/24hr patch  APP 1 PA EXT TO THE SKIN D 07/01/17   [provider]  nystatin (MYCOSTATIN) 100000 UNIT/ML suspension Take 5 mLs by mouth 4 (four) times daily as needed (for trush).  11/25/16   [provider]  oxyCODONE (ROXICODONE) 15 MG immediate release tablet Take 15 mg by mouth every 4 (four) hours.     [provider]  promethazine (PHENERGAN) 25 MG tablet Take 25 mg by mouth every 6 (six) hours as needed for nausea or vomiting.    [provider]      PHYSICAL EXAMINATION:   VITAL SIGNS: Blood pressure (!) 108/45, pulse 80, temperature 97.6 F (36.4 C), temperature source Oral, resp. rate 15, height 4\' 11"  (1.499 m), weight 83.9 kg (185 lb), SpO2 91 %.  GENERAL:  77 y.o.-year-old patient lying in the bed with no acute distress.  EYES: Pupils equal, round, reactive to light and accommodation. No scleral icterus. Extraocular muscles intact.  HEENT: Head atraumatic, normocephalic. Oropharynx and nasopharynx clear.  NECK:  Supple, no jugular venous distention. No thyroid enlargement, no tenderness.  LUNGS: Normal breath sounds bilaterally, no wheezing, rales,rhonchi or crepitation. No use of accessory muscles of respiration.   CARDIOVASCULAR: S1, S2 normal. No murmurs, rubs, or gallops.  ABDOMEN: Soft, nontender, nondistended. Bowel sounds present. No organomegaly or mass.  EXTREMITIES: 1+ pedal edema, cyanosis, or clubbing.  NEUROLOGIC: Cranial nerves II through XII are intact. Muscle strength 5/5 in all extremities. Sensation intact. Gait not checked.  PSYCHIATRIC: The patient is alert and oriented x 3.  SKIN: No obvious rash, lesion, or ulcer.   LABORATORY PANEL:   CBC Recent Labs  Lab 08/15/17 1411 08/15/17 1708  WBC 26.2* 29.4*  HGB 10.1* 11.0*  HCT 32.3* 34.3*  PLT 242 271  MCV 97.0 96.4  MCH 30.5 30.9  MCHC 31.4* 32.0  RDW 18.7* 19.3*  LYMPHSABS 0.3* 0.3*  MONOABS 0.9 1.0*  EOSABS 0.0 0.0  BASOSABS 0.1 0.1   ------------------------------------------------------------------------------------------------------------------  Chemistries  Recent Labs  Lab 08/15/17 1411  NA 132*  K 4.8  CL 98*  CO2 25  GLUCOSE 130*  BUN 55*  CREATININE 1.97*  CALCIUM 6.2*  MG 2.2  AST 44*  ALT 18  ALKPHOS 104  BILITOT 0.7   ------------------------------------------------------------------------------------------------------------------ estimated creatinine clearance is 22.8 mL/min (A) (by C-G formula based on SCr of 1.97 mg/dL (H)). ------------------------------------------------------------------------------------------------------------------ No results for input(s): TSH, T4TOTAL, T3FREE, THYROIDAB in the last 72 hours.  Invalid input(s): FREET3   Coagulation profile No results for input(s): INR, PROTIME in the last 168 hours. ------------------------------------------------------------------------------------------------------------------- No results for input(s): DDIMER in the last 72 hours. -------------------------------------------------------------------------------------------------------------------  Cardiac Enzymes Recent Labs  Lab 08/15/17 1411  TROPONINI 0.04*    ------------------------------------------------------------------------------------------------------------------ Invalid input(s): POCBNP  ---------------------------------------------------------------------------------------------------------------  Urinalysis    Component Value Date/Time   COLORURINE AMBER (A) 08/15/2017 1430   APPEARANCEUR TURBID (A) 08/15/2017 1430   APPEARANCEUR Clear 02/20/2013 1919   LABSPEC 1.014 08/15/2017 1430   LABSPEC 1.012 02/20/2013 1919   PHURINE 8.0 08/15/2017 1430   GLUCOSEU NEGATIVE 08/15/2017 1430   GLUCOSEU Negative 02/20/2013 1919   HGBUR NEGATIVE 08/15/2017 1430   BILIRUBINUR NEGATIVE 08/15/2017 1430   BILIRUBINUR Negative 02/20/2013 1919   KETONESUR NEGATIVE 08/15/2017 1430   PROTEINUR 100 (A) 08/15/2017 1430   NITRITE NEGATIVE 08/15/2017 1430   LEUKOCYTESUR LARGE (A) 08/15/2017 1430   LEUKOCYTESUR Negative 02/20/2013 1919     RADIOLOGY: Dg Chest Portable 1 View  Result Date: 08/15/2017 CLINICAL DATA:  Status post central line  placement today. EXAM: PORTABLE CHEST 1 VIEW COMPARISON:  Single-view of the chest earlier today. CT chest 08/15/2017. FINDINGS: The catheter courses along the medial aspect of the left chest and the tip projects in the aorta. No pneumothorax. Cardiomegaly and vascular congestion are again seen. There is left basilar atelectasis. IMPRESSION: Left-side catheter position is atypical for venous placement and the catheter may be within the aorta. Lateral film is recommended for further evaluation. No pneumothorax. These results were called by telephone at the time of interpretation on 08/15/2017 at 4:21 pm to Dr. Dorothea GlassmanPAUL MALINDA , who verbally acknowledged these results. Electronically Signed   By: Drusilla Kannerhomas  Dalessio M.D.   On: 08/15/2017 16:24   Dg Chest Portable 1 View  Result Date: 08/15/2017 CLINICAL DATA:  Altered mental status. Low blood pressure. History of pneumonia, hypertension GERD, heart murmur, diabetes and  COPD. EXAM: PORTABLE CHEST 1 VIEW COMPARISON:  06/26/2017 FINDINGS: Mild cardiac enlargement and aortic atherosclerosis. Diffuse pulmonary vascular congestion noted. Scar or subsegmental atelectasis noted within both lower lobes. Previous right shoulder arthroplasty. IMPRESSION: 1. Cardiac enlargement and aortic atherosclerosis. Aortic Atherosclerosis (ICD10-I70.0). 2. Pulmonary vascular congestion. Electronically Signed   By: Signa Kellaylor  Stroud M.D.   On: 08/15/2017 15:23    EKG: Orders placed or performed during the hospital encounter of 08/15/17  . ED EKG 12-Lead  . ED EKG 12-Lead  . EKG 12-Lead  . EKG 12-Lead    IMPRESSION AND PLAN: Patient is 77 year old white female with COPD chronic respiratory failure with sepsis  1.  Sepsis due to UTI We will give her aggressive IV fluids I will give her IV meropenem Follow urine culture blood culture  2.  Hypotension due to #1 IV fluids I will start patient on midodrine for now can be discontinued tomorrow  3.  Diabetes type 2 placed on sliding scale insulin  4.  COPD continue oxygen and inhalers as doing at home  5.  Hyperlipidemia continue home regimen  6.  Miscellaneous Lovenox for DVT prophylaxis   All the records are reviewed and case discussed with ED provider. Management plans discussed with the patient, family and they are in agreement.  CODE STATUS: Code Status History    Date Active Date Inactive Code Status Order ID Comments User Context   06/20/2017 1157 06/21/2017 1837 DNR [id number]  Shaune Pollackhen, Qing, MD Inpatient   01/08/2017 1422 01/09/2017 1959 Full Code [id number]  Signa KellPatel, Sunny, MD Inpatient    Questions for Most Recent Historical Code Status (Order [id number])    Question Answer Comment   In the event of cardiac or respiratory ARREST Do not call a "code blue"    In the event of cardiac or respiratory ARREST Do not perform Intubation, CPR, defibrillation or ACLS    In the event of cardiac or respiratory ARREST Use medication by  any route, position, wound care, and other measures to relive pain and suffering. May use oxygen, suction and manual treatment of airway obstruction as needed for comfort.        TOTAL TIME TAKING CARE OF THIS PATIENT: 55minutes.    Auburn BilberryShreyang Larayah Clute M.D on 08/15/2017 at 5:20 PM  Between 7am to 6pm - Pager - [phone]  After 6pm go to [email or web address] - password [email or web address]  [facility] Office  [phone]  CC: Primary care physician; Mickey Farberhies, David, MD

## 2017-08-15 NOTE — Progress Notes (Signed)
Pharmacist - Prescriber Communication  Enoxaparin dose changed to 30 mg subcutaneously once daily due to CrCL less than 30 mL/min.  Shardae Kleinman A. Candler-McAfeeookson, VermontPharm.D., BCPS Clinical Pharmacist 08/15/17 20:38

## 2017-08-15 NOTE — ED Triage Notes (Signed)
Per patient not able to urinate x couple of days.

## 2017-08-15 NOTE — Progress Notes (Signed)
eLink Physician-Brief Progress Note Patient Name: Brenda NoJamie Graves Glenn DOB: 29-May-1940 MRN: [id number]   Date of Service  08/15/2017  HPI/Events of Note  77 y.o. female with a known history of osteoarthritis, COPD on 2 L of oxygen, diabetes type 2, GERD, essential hypertension, hypercholesteremia chronic lower extremity swelling presenting to the emergency room with complaint of urinary retention and feeling very tired and weak.  The symptoms have been going on for more than 2 weeks.  Patient's evaluation in the ER shows she has a urinary tract infection and signs of sepsis.  Patient also noticed to have hypotension receiving fluid boluses. BP now = 110/61 with MAP = 75 and HR = 96. O2 dat = 95% and RR = 16. Empiric Abx Rx with Meropenem. PCCM asked to assume care.   eICU Interventions  Glenn new orders.      Intervention Category Evaluation Type: New Patient Evaluation  Lenell AntuSommer,Steven Eugene 08/15/2017, 8:44 PM

## 2017-08-15 NOTE — Progress Notes (Signed)
Pharmacist - Prescriber Communication  Meropenem dose modified from 1 gm IV Q8H to 500 mg IV Q12H due to CrCl 10 to 25 mL/min.   Brenda Glenn, VermontPharm.D., BCPS Clinical Pharmacist 08/15/17 17:58

## 2017-08-15 NOTE — Consult Note (Addendum)
PULMONARY / CRITICAL CARE MEDICINE   Name: [name] NoJamie Graves Haislip MRN: [id number] DOB: Jan 28, 1941    ADMISSION DATE:  08/15/2017   CONSULTATION DATE: 08/15/2017  REFERRING MD: Dr. Allena KatzPatel  Reason: Septic shock  HISTORY OF PRESENT ILLNESS:   This is a 77 year old female with a medical history as indicated below who presented to the ED with complaints of urinary retention x2 days and generalized weakness x2 weeks.  Patient's last void was yesterday.   Patient's daughter also state that patient had intermittent hallucinations x1 week. Today symptoms got worse and hence patient was taken to [place].  From there she was referred to the emergency room for further evaluation and treatment.  Her ED work-up revealed a WBC of 26,000, urinalysis was abnormal and her creatinine was 1.97.  She was ruled in for urosepsis.  She became hypotensive necessitating pressors.  She is being admitted to the ICU for further management of urosepsis, acute metabolic encephalopathy and septic shock.   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  PAST MEDICAL HISTORY :  She  has a past medical history of Arthritis, Back pain, COPD (chronic obstructive pulmonary disease) (HCC), Diabetes mellitus without complication (HCC), GERD (gastroesophageal reflux disease), Heart murmur, Hypercholesterolemia, Hypertension, Pneumonia (2015), Renal insufficiency, Shortness of breath dyspnea, Sleep apnea, and Swelling.  PAST SURGICAL HISTORY: She  has a past surgical history that includes Cataract extraction (Right); Cholecystectomy (2011); Abdominal hysterectomy (1988); Replacement total knee (Right); Back surgery (1978 and 1995); Cataract extraction w/PHACO (Left, 09/05/2014); Joint replacement (Right, 2009); Bladder surgery (2017); and Reverse shoulder arthroplasty (Right, 01/08/2017).  Allergies  Allergen Reactions  .  Tequin [Gatifloxacin] Shortness Of Breath  . Bupropion     Other reaction(s): Other (See Comments) shaking  . Keflex [Cephalexin] Other (See Comments)    Unknown  . Lisinopril Other (See Comments)    Unknown  . Varenicline Nausea Only and Nausea And Vomiting    No current facility-administered medications on file prior to encounter.    Current Outpatient Medications on File Prior to Encounter  Medication Sig  . albuterol (PROVENTIL HFA;VENTOLIN HFA) 108 (90 Base) MCG/ACT inhaler Inhale 2 puffs into the lungs every 6 (six) hours as needed for wheezing or shortness of breath.  . allopurinol (ZYLOPRIM) 100 MG tablet Take 200 mg by mouth daily.   Marland Kitchen. aspirin EC 81 MG tablet Take 81 mg by mouth daily.   . diclofenac (FLECTOR) 1.3 % PTCH Place 1 patch onto the skin daily as needed (for pain).  Marland Kitchen. ezetimibe (ZETIA) 10 MG tablet Take 10 mg by mouth at bedtime.  . felodipine (PLENDIL) 10 MG 24 hr tablet Take 10 mg by mouth at bedtime.  . fluticasone (FLONASE) 50 MCG/ACT nasal spray Place 2 sprays into both nostrils daily as needed for allergies.  . fluticasone-salmeterol (ADVAIR HFA) 115-21 MCG/ACT inhaler Inhale 2 puffs into the lungs 2 (two) times daily.   . furosemide (LASIX) 40 MG tablet Take 80 mg by mouth daily.   Marland Kitchen. gabapentin (NEURONTIN) 300 MG capsule Take 1 capsule (300MG ) by mouth every morning and lunchtime and 2 capsules (600MG ) at bedtime  . guaiFENesin (MUCINEX) 600 MG 12 hr tablet Take 600 mg by mouth every 12 (twelve) hours as needed for cough.  . metoprolol succinate (TOPROL-XL) 100 MG 24 hr tablet Take 100 mg by mouth daily.   . nicotine (NICODERM CQ - DOSED  IN MG/24 HOURS) 14 mg/24hr patch APP 1 PA EXT TO THE SKIN D  . omeprazole (PRILOSEC) 40 MG capsule Take 40 mg by mouth daily.   Marland Kitchen oxyCODONE (ROXICODONE) 15 MG immediate release tablet Take 15 mg by mouth 6 (six) times daily.   . pramipexole (MIRAPEX) 0.5 MG tablet Take 1 mg by mouth at bedtime.   . theophylline (THEO-24) 400  MG 24 hr capsule Take 400 mg by mouth daily.   Marland Kitchen tiotropium (SPIRIVA) 18 MCG inhalation capsule Place 18 mcg into inhaler and inhale daily.   . Vitamin D, Ergocalciferol, (DRISDOL) 50000 UNITS CAPS capsule Take 50,000 Units by mouth every 14 (fourteen) days.    FAMILY HISTORY:  Her indicated that her mother is deceased. She indicated that her paternal grandmother is deceased.   SOCIAL HISTORY: She  reports that she has been smoking cigarettes.  She has a 12.50 pack-year smoking history. She has never used smokeless tobacco. She reports that she drinks about 1.8 oz of alcohol per week. She reports that she does not use drugs.  REVIEW OF SYSTEMS:   Unable to obtain as patient is currently confused  SUBJECTIVE:   VITAL SIGNS: BP (!) 101/47   Pulse 68   Temp 97.6 F (36.4 C) (Oral)   Resp 15   Ht 4\' 11"  (1.499 m)   Wt 185 lb (83.9 kg)   SpO2 93%   BMI 37.37 kg/m   HEMODYNAMICS:    VENTILATOR SETTINGS:    INTAKE / OUTPUT: I/O last 3 completed shifts: In: 300 [IV Piggyback:300] Out: 550 [Urine:550]  PHYSICAL EXAMINATION: General: In moderate distress Neuro: Alert and oriented x2, unable to follow basic commands, visual hallucinations HEENT: PERRLA, trachea midline, no JVD Cardiovascular: Apical pulse irregular, S1-S2, no murmur regurg or gallop, +2 pulses bilaterally, +1 edema bilateral Lungs: Bilateral breath sounds, diminished in the bases, no wheezing or rhonchi Abdomen: Obese, normal bowel sounds in all 4 quadrants, palpation reveals no organomegaly Musculoskeletal: Positive range of motion in upper and lower extremities, no deformities Skin: Warm and dry  LABS:  BMET Recent Labs  Lab 08/15/17 1411  NA 132*  K 4.8  CL 98*  CO2 25  BUN 55*  CREATININE 1.97*  GLUCOSE 130*    Electrolytes Recent Labs  Lab 08/15/17 1411  CALCIUM 6.2*  MG 2.2    CBC Recent Labs  Lab 08/15/17 1411 08/15/17 1708  WBC 26.2* 29.4*  HGB 10.1* 11.0*  HCT 32.3* 34.3*   PLT 242 271    Coag's No results for input(s): APTT, INR in the last 168 hours.  Sepsis Markers Recent Labs  Lab 08/15/17 1411 08/15/17 1708  LATICACIDVEN 1.2 0.9    ABG Recent Labs  Lab 08/15/17 1635  PHART 7.36  PCO2ART 46  PO2ART 56*    Liver Enzymes Recent Labs  Lab 08/15/17 1411  AST 44*  ALT 18  ALKPHOS 104  BILITOT 0.7  ALBUMIN 2.6*    Cardiac Enzymes Recent Labs  Lab 08/15/17 1411  TROPONINI 0.04*    Glucose Recent Labs  Lab 08/15/17 1300  GLUCAP 126*    Imaging Dg Chest 1 View  Result Date: 08/15/2017 CLINICAL DATA:  Status post central line placement today. EXAM: CHEST  1 VIEW COMPARISON:  Single-view of the chest earlier today. CT chest 05/22/2014. FINDINGS: Position of the patient's catheter cannot be definitively determined but the course of the catheter does not follow the patient's left internal jugular and subclavian veins as seen on prior chest  CT worrisome for the catheter lying within the aorta. Small left pleural effusion noted. IMPRESSION: Abnormal course of a central line in the left neck is worrisome for placement in the aorta. Small left pleural effusion. These results were called by telephone at the time of interpretation on 08/15/2017 at 5:24 pm to Dr. Willy Eddy , who verbally acknowledged these results. Electronically Signed   By: Drusilla Kanner M.D.   On: 08/15/2017 17:27   Ct Chest Wo Contrast  Result Date: 08/15/2017 CLINICAL DATA:  Follow-up chest x-ray post central line placement. Chest x-ray report from earlier today described an abnormal course of the central line in the LEFT neck worrisome for placement in the aorta. EXAM: CT CHEST WITHOUT CONTRAST TECHNIQUE: Multidetector CT imaging of the chest was performed following the standard protocol without IV contrast. COMPARISON:  None. FINDINGS: Cardiovascular: The tip of the LEFT IJ central line appears to be within the LEFT brachiocephalic vein, just anterior to the  aortic arch Aortic atherosclerosis. No thoracic aortic aneurysm. Heart size is upper normal. No pericardial effusion. Mediastinum/Nodes: No mass or enlarged lymph nodes within the mediastinum or perihilar regions. No hemorrhage or edema within the mediastinum. Esophagus is unremarkable. Trachea appears normal. Lungs/Pleura: Platelike consolidation within the lingula is most likely atelectasis. Additional smaller patchy consolidations within the bilateral lower lobes, atelectasis versus pneumonia. Small LEFT pleural effusion. No pneumothorax. Upper Abdomen: Limited images of the upper abdomen are unremarkable. Musculoskeletal: No acute or suspicious osseous finding. Degenerative changes throughout the thoracic spine, moderate in degree. Ill-defined fluid/edema within the subcutaneous soft tissues of the lower chest and upper abdomen, presumed anasarca. Small drainage catheter within the superficial soft tissues of the LEFT breast. IMPRESSION: 1. The LEFT IJ central line appears to be intravenous with tip in the LEFT brachiocephalic vein. Would consider advancing for more optimal radiographic positioning in the SVC. 2. Small patchy consolidations at the bilateral lung bases, atelectasis versus pneumonia. 3. Small LEFT pleural effusion. 4. Aortic atherosclerosis. 5. Probable anasarca. Aortic Atherosclerosis (ICD10-I70.0). Electronically Signed   By: Bary Richard M.D.   On: 08/15/2017 18:30   Dg Chest Portable 1 View  Result Date: 08/15/2017 CLINICAL DATA:  Status post central line placement today. EXAM: PORTABLE CHEST 1 VIEW COMPARISON:  Single-view of the chest earlier today. CT chest 08/15/2017. FINDINGS: The catheter courses along the medial aspect of the left chest and the tip projects in the aorta. No pneumothorax. Cardiomegaly and vascular congestion are again seen. There is left basilar atelectasis. IMPRESSION: Left-side catheter position is atypical for venous placement and the catheter may be within the  aorta. Lateral film is recommended for further evaluation. No pneumothorax. These results were called by telephone at the time of interpretation on 08/15/2017 at 4:21 pm to Dr. Dorothea Glassman , who verbally acknowledged these results. Electronically Signed   By: Drusilla Kanner M.D.   On: 08/15/2017 16:24   Dg Chest Portable 1 View  Result Date: 08/15/2017 CLINICAL DATA:  Altered mental status. Low blood pressure. History of pneumonia, hypertension GERD, heart murmur, diabetes and COPD. EXAM: PORTABLE CHEST 1 VIEW COMPARISON:  06/26/2017 FINDINGS: Mild cardiac enlargement and aortic atherosclerosis. Diffuse pulmonary vascular congestion noted. Scar or subsegmental atelectasis noted within both lower lobes. Previous right shoulder arthroplasty. IMPRESSION: 1. Cardiac enlargement and aortic atherosclerosis. Aortic Atherosclerosis (ICD10-I70.0). 2. Pulmonary vascular congestion. Electronically Signed   By: Signa Kell M.D.   On: 08/15/2017 15:23   STUDIES:  2D echo pending  CULTURES: Blood cultures x2 Urine  culture  ANTIBIOTICS: Zosyn 08/15/2017>  SIGNIFICANT EVENTS: 06/16>Admitted  LINES/TUBES: Peripheral IVs Foley  DISCUSSION: 77 year old female presenting with urosepsis, septic shock and severe encephalopathy from sepsis  ASSESSMENT Septic shock necessitating pressors Urosepsis Acute urinary retention Acute metabolic encephalopathy Acute on chronic renal failure Type 2 diabetes mellitus COPD on home O2 2 L nasal cannula GERD   PLAN Hemodynamic monitoring per ICU protocol IV fluids and pressors to maintain mean arterial blood pressure greater than 65 Antibiotics as above Follow-up cultures We will consider CT head if patient's mentation does not improve with resolution of sepsis Trend creatinine and procalcitonin Monitor and replace electrolytes Blood glucose monitoring with sliding scale insulin coverage Continue nebulized bronchodilators and inhaled steroids GI and DVT  prophylaxis  FAMILY  - Updates: Patient and daughter updated at bedside  Bethsaida Siegenthaler S. [facility] ANP-BC Pulmonary and Critical Care Medicine [facility] Pager [phone] or [phone]  NB: This document was prepared using Dragon voice recognition software and may include unintentional dictation errors.     08/15/2017, 7:16 PM

## 2017-08-15 NOTE — ED Notes (Signed)
Per Dr. Darnelle CatalanMalinda, OK to start antibiotics with 1 set of blood cultures.

## 2017-08-15 NOTE — ED Provider Notes (Addendum)
.  Central Line Date/Time: 08/15/2017 4:06 PM Performed by: Willy Eddyobinson, Chakira Jachim, MD Authorized by: Willy Eddyobinson, Aakash Hollomon, MD   Consent:    Consent obtained:  Emergent situation   Consent given by:  Patient Pre-procedure details:    Hand hygiene: Hand hygiene performed prior to insertion     Sterile barrier technique: All elements of maximal sterile technique followed     Skin preparation:  2% chlorhexidine   Skin preparation agent: Skin preparation agent completely dried prior to procedure   Procedure details:    Location:  L internal jugular   Patient position:  Flat   Procedural supplies:  Triple lumen   Catheter size:  7 Fr   Landmarks identified: yes     Ultrasound guidance: yes     Sterile ultrasound techniques: Sterile gel and sterile probe covers were used     Number of attempts:  1   Successful placement: yes   Post-procedure details:    Post-procedure:  Dressing applied and line sutured   Assessment:  Blood return through all ports and free fluid flow   Patient tolerance of procedure:  Tolerated well, no immediate complications   Tolerated procedure well.  Post placement chest x-ray did show abnormal positioning of the CVC catheter however is very clearly venous flow nonpulsatile and low pressure despite adequate mean arterial pressures via blood pressure cuff.  Dark venous blood is being return through all 3 ports and again all 3 were checked to evaluate for any pulsatile flow.  Placement was confirmed with the guidewire in the left IJ.  This was confirmed again with Dr. Juliette AlcideMelinda at bedside.    Willy Eddyobinson, Griffey Nicasio, MD 08/15/17 1606    Willy Eddyobinson, Dorris Pierre, MD 08/15/17 1723

## 2017-08-15 NOTE — Progress Notes (Signed)
Advanced care plan.  Purpose of the Encounter: CODE STATUS  Parties in Attendance:patient , husband  Patient's Decision Capacity:intact  Subjective/Patient's story: Patient 77 year old white female with chronic respiratory failure on 2 L oxygen chronically COPD, essential hypertension, hyperlipidemia, GERD, diabetes type 2 presenting to the hospital with sepsis   Objective/Medical story I asked for patient's opinion regarding CODE STATUS, I explained her regarding intubation CPR   Goals of care determination: Patient wishes to be a  full code   CODE STATUS: Full code   Time spent discussing advanced care planning: 16 minutes

## 2017-08-15 NOTE — ED Notes (Signed)
Dr. Roxan Hockeyobinson at bedside to place central line.

## 2017-08-15 NOTE — ED Notes (Signed)
Unable to draw lactic acid at this time d/t central line placement.

## 2017-08-15 NOTE — ED Triage Notes (Signed)
[facility] with [facility]. Pt went to urgent care for acute urinary retention. States she has not been able to urinate for last 24 hours. Pt has peripheral pitting edema to all bilateral lower extremities and right arm.  Pt reports medication change - gabapentin increased x 1 week ago. Pt states she has been more sleepy than normal since then. States she "just about passes out" but denies any LOC.  Takes pain medication chronically for back pain.  Pt also c/o pain on bottom.   Pt has COPD and renal disorder. Denies hx CHF or dialysis. Denies feeling like there is a change in her breathing. Chronic 2L O2. Sat 90%, increased to 4L by EMS. Sat increased to 95-96%.

## 2017-08-15 NOTE — ED Notes (Signed)
This RN and Butch, RN to transport pt to ICU16. Unit aware.

## 2017-08-15 NOTE — ED Provider Notes (Addendum)
[facility] Emergency Department Provider Note   ____________________________________________   First MD Initiated Contact with Patient 08/15/17 1353     (approximate)  I have reviewed the triage vital signs and the nursing notes.   HISTORY  Chief Complaint Urinary Retention and Multiple Complaints   HPI Brenda Glenn is a 77 y.o. female who comes in planing of increasing edema unable to urinate for the last 2 days.  Also complains of a sore on her buttocks which is very tender.  She says is 9 out of 10 pain.  He is also very sleepy.  Review of her old records show that all of these except for the sore on her buttocks are all problems.   Past Medical History:  Diagnosis Date  . Arthritis    all over  . Back pain   . COPD (chronic obstructive pulmonary disease) (HCC)    O2 use continuosly at 2 liters/ some  asthma symptoms  . Diabetes mellitus without complication (HCC)    diet controlled  . GERD (gastroesophageal reflux disease)   . Heart murmur    lifelong  . Hypercholesterolemia   . Hypertension    controlled on meds  . Pneumonia 2015   [facility] hospitalized  . Renal insufficiency    early stage kidney failure  . Shortness of breath dyspnea   . Sleep apnea    2nd sleep study said pt does not have sleep apnea  . Swelling    of feet and legs    Patient Active Problem List   Diagnosis Date Noted  . Acute respiratory failure with hypoxia and hypercapnia (HCC)   . Hyperkalemia   . HCAP (healthcare-associated pneumonia) 09/03/2017  . Acute on chronic respiratory failure with hypoxia (HCC) 09/03/2017  . Osteoarthritis 08/30/2017  . Restless leg syndrome 08/21/2017  . Perianal abscess   . Sepsis (HCC) 08/15/2017  . Bilateral lower extremity edema 07/28/2017  . Lower extremity numbness 07/28/2017  . ARF (acute renal failure) (HCC) 06/20/2017  . 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 Multiple thyroid nodules 10/22/2016  .  Lymphedema 01/06/2016  . Chronic venous insufficiency 01/06/2016  . Pain in limb 01/06/2016  . COPD (chronic obstructive pulmonary disease) (HCC) 01/06/2016  . Abnormality of gait 01/06/2016  . Hypomagnesemia 05/24/2015  . Type 2 diabetes mellitus (HCC) 11/16/2013  . Benign essential hypertension 11/16/2013  . GERD (gastroesophageal reflux disease) 11/16/2013  . Hyperlipidemia 11/16/2013    Past Surgical History:  Procedure Laterality Date  . ABDOMINAL HYSTERECTOMY  1988  . BACK SURGERY  1978 and 1995   residual nerve damage legs-weakness  . BLADDER SURGERY  2017  . CATARACT EXTRACTION Right   . CATARACT EXTRACTION W/PHACO Left 09/05/2014   Procedure: CATARACT EXTRACTION PHACO AND INTRAOCULAR LENS PLACEMENT (IOC);  Surgeon: Lockie Mola, MD;  Location: [facility];  Service: Ophthalmology;  Laterality: Left;  DIABETIC  . CHOLECYSTECTOMY  2011  . INCISION AND DRAINAGE PERIRECTAL ABSCESS N/A 08/20/2017   Procedure: IRRIGATION AND DEBRIDEMENT PERIRECTAL ABSCESS;  Surgeon: Leafy Ro, MD;  Location: [facility] ORS;  Service: General;  Laterality: N/A;  . JOINT REPLACEMENT Right 2009  . RECTAL EXAM UNDER ANESTHESIA N/A 08/20/2017   Procedure: RECTAL EXAM UNDER ANESTHESIA;  Surgeon: Leafy Ro, MD;  Location: [facility] ORS;  Service: General;  Laterality: N/A;  . REPLACEMENT TOTAL KNEE Right   . REVERSE SHOULDER ARTHROPLASTY Right 01/08/2017   Procedure: REVERSE SHOULDER ARTHROPLASTY;  Surgeon: Signa Kell, MD;  Location: [facility];  Service: Orthopedics;  Laterality: Right;    Prior to Admission medications   Medication Sig Start Date End Date Taking? Authorizing Provider  albuterol (PROVENTIL HFA;VENTOLIN HFA) 108 (90 Base) MCG/ACT inhaler Inhale 2 puffs into the lungs every 6 (six) hours as needed for wheezing or shortness of breath.   Yes [provider]  allopurinol (ZYLOPRIM) 100 MG tablet Take 200 mg by mouth daily.    Yes [provider]  aspirin EC 81  MG tablet Take 81 mg by mouth daily.    Yes [provider]  diclofenac (FLECTOR) 1.3 % PTCH Place 1 patch onto the skin daily as needed (for pain).   Yes [provider]  ezetimibe (ZETIA) 10 MG tablet Take 10 mg by mouth at bedtime.   Yes [provider]  felodipine (PLENDIL) 10 MG 24 hr tablet Take 10 mg by mouth at bedtime.   Yes [provider]  fluticasone (FLONASE) 50 MCG/ACT nasal spray Place 2 sprays into both nostrils daily as needed for allergies. 12/08/16  Yes [provider]  fluticasone-salmeterol (ADVAIR HFA) 115-21 MCG/ACT inhaler Inhale 2 puffs into the lungs 2 (two) times daily.    Yes [provider]  guaiFENesin (MUCINEX) 600 MG 12 hr tablet Take 600 mg by mouth every 12 (twelve) hours as needed for cough.   Yes [provider]  metoprolol succinate (TOPROL-XL) 100 MG 24 hr tablet Take 100 mg by mouth daily.    Yes [provider]  nicotine (NICODERM CQ - DOSED IN MG/24 HOURS) 14 mg/24hr patch APP 1 PA EXT TO THE SKIN D 07/01/17  Yes [provider]  omeprazole (PRILOSEC) 40 MG capsule Take 40 mg by mouth daily.    Yes [provider]  pramipexole (MIRAPEX) 0.5 MG tablet Take 1 mg by mouth at bedtime.    Yes [provider]  tiotropium (SPIRIVA) 18 MCG inhalation capsule Place 18 mcg into inhaler and inhale daily.    Yes [provider]  Vitamin D, Ergocalciferol, (DRISDOL) 50000 UNITS CAPS capsule Take 50,000 Units by mouth every 14 (fourteen) days.   Yes [provider]  bisacodyl (DULCOLAX) 5 MG EC tablet Take 2 tablets (10 mg total) by mouth daily. 08/27/17   Shaune Pollack, MD  calcium carbonate (TUMS - DOSED IN MG ELEMENTAL CALCIUM) 500 MG chewable tablet Chew 2 tablets (1,000 mg total) by mouth 3 (three) times daily with meals. 08/26/17   Shaune Pollack, MD  docusate sodium (COLACE) 100 MG capsule Take 1 capsule (100 mg total) by mouth 2 (two) times daily. 08/26/17   Shaune Pollack, MD  folic acid (FOLVITE) 1 MG tablet Take 1 tablet (1 mg total) by mouth daily. 08/27/17   Shaune Pollack, MD  gabapentin (NEURONTIN) 400 MG capsule Take 1 capsule (400 mg total) by mouth 3 (three) times daily. 08/26/17   Shaune Pollack, MD  LORazepam (ATIVAN) 0.5 MG tablet Take 0.5 mg by mouth every 4 (four) hours as needed for anxiety.    [provider]  oxyCODONE (ROXICODONE) 15 MG immediate release tablet Take 1 tablet (15 mg total) by mouth every 6 (six) hours. 08/26/17   Shaune Pollack, MD  sulfamethoxazole-trimethoprim (BACTRIM DS,SEPTRA DS) 800-160 MG tablet Take 1 tablet by mouth every 12 (twelve) hours. 08/26/17   Shaune Pollack, MD  theophylline (UNIPHYL) 400 MG 24 hr tablet Take 1 tablet by mouth daily.  08/17/17   [provider]    Allergies Tequin [gatifloxacin]; Bupropion; Keflex [  cephalexin]; Lisinopril; and Varenicline  Family History  Problem Relation Age of Onset  . Breast cancer Mother 35  . Breast cancer Paternal Grandmother 94    Social History Social History   Tobacco Use  . Smoking status: Current Every Day Smoker    Packs/day: 0.25    Years: 50.00    Pack years: 12.50    Types: Cigarettes  . Smokeless tobacco: Never Used  Substance Use Topics  . Alcohol use: Yes    Alcohol/week: 1.8 oz    Types: 3 Glasses of wine per week    Comment: rarely  . Drug use: No    Review of Systems  Constitutional: No fever/chills Eyes: No visual changes. ENT: No sore throat. Cardiovascular: Denies chest pain. Respiratory: Denies shortness of breath. Gastrointestinal: No abdominal pain.  No nausea, no vomiting.  No diarrhea.  No constipation. Genitourinary: See HPI Musculoskeletal: Negative for back pain. Skin: Negative for rash. Neurological: Negative for headaches, focal weakness  ____________________________________________   PHYSICAL EXAM:  VITAL SIGNS: ED Triage Vitals  Enc Vitals Group     BP 08/15/17 1359 (!) 96/51     Pulse Rate 08/15/17 1359  77     Resp 08/15/17 1359 14     Temp 08/15/17 1359 97.6 F (36.4 C)     Temp Source 08/15/17 1359 Oral     SpO2 08/15/17 1359 93 %     Weight 08/15/17 1404 185 lb (83.9 kg)     Height 08/15/17 1404 4\' 11"  (1.499 m)     Head Circumference --      Peak Flow --      Pain Score 08/15/17 1400 9     Pain Loc --      Pain Edu? --      Excl. in GC? --     Constitutional: Sleepy but arousable she is oriented Eyes: Conjunctivae are normal. PER. EOMI. Head: Atraumatic. Nose: No congestion/rhinnorhea. Mouth/Throat: Mucous membranes are moist.  Oropharynx non-erythematous. Neck: No stridor.   Cardiovascular: Normal rate, regular rhythm. Grossly normal heart sounds.  Good peripheral circulation. Respiratory: Normal respiratory effort.  No retractions. Lungs CTAB. Gastrointestinal: Soft and nontender. No distention. No abdominal bruits. No CVA tenderness. Musculoskeletal: No lower extremity tenderness edema both legs and the right arm   No joint effusions. Neurologic:  Normal speech and language. No gross focal neurologic deficits are appreciated.. Skin:  Skin is warm, dry patient has a 1 x 2 cm superficial apparently decubitus in the crack above the buttocks and a 1 cm ulcer in the left labia majora on the inside.  Very tender. Psychiatric: Mood and affect are normal. Speech and behavior are normal.  ____________________________________________   LABS (all labs ordered are listed, but only abnormal results are displayed)  Labs Reviewed  CULTURE, BLOOD (SINGLE) - Abnormal; Notable for the following components:      Result Value   Culture   (*)    Value: STAPHYLOCOCCUS SPECIES (COAGULASE NEGATIVE) THE SIGNIFICANCE OF ISOLATING THIS ORGANISM FROM A SINGLE SET OF BLOOD CULTURES WHEN MULTIPLE SETS ARE DRAWN IS UNCERTAIN. PLEASE NOTIFY THE MICROBIOLOGY DEPARTMENT WITHIN ONE WEEK IF SPECIATION AND SENSITIVITIES ARE REQUIRED. Performed at [facility], [address] [place], [place]  [address]    All other components within normal limits  MRSA PCR SCREENING - Abnormal; Notable for the following components:   MRSA by PCR POSITIVE (*)    All other components within normal limits  URINE CULTURE - Abnormal; Notable for  the following components:   Culture   (*)    Value: >=100,000 COLONIES/mL PROTEUS MIRABILIS 80,000 COLONIES/mL ESCHERICHIA COLI    Organism ID, Bacteria PROTEUS MIRABILIS (*)    Organism ID, Bacteria ESCHERICHIA COLI (*)    All other components within normal limits  BLOOD CULTURE ID PANEL (REFLEXED) - Abnormal; Notable for the following components:   Staphylococcus species DETECTED (*)    All other components within normal limits  CBC WITH DIFFERENTIAL/PLATELET - Abnormal; Notable for the following components:   WBC 26.2 (*)    RBC 3.33 (*)    Hemoglobin 10.1 (*)    HCT 32.3 (*)    MCHC 31.4 (*)    RDW 18.7 (*)    Neutro Abs 25.0 (*)    Lymphs Abs 0.3 (*)    All other components within normal limits  URINALYSIS, COMPLETE (UACMP) WITH MICROSCOPIC - Abnormal; Notable for the following components:   Color, Urine AMBER (*)    APPearance TURBID (*)    Protein, ur 100 (*)    Leukocytes, UA LARGE (*)    WBC, UA >50 (*)    Bacteria, UA MANY (*)    All other components within normal limits  COMPREHENSIVE METABOLIC PANEL - Abnormal; Notable for the following components:   Sodium 132 (*)    Chloride 98 (*)    Glucose, Bld 130 (*)    BUN 55 (*)    Creatinine, Ser 1.97 (*)    Calcium 6.2 (*)    Total Protein 6.0 (*)    Albumin 2.6 (*)    AST 44 (*)    GFR calc non Af Amer 23 (*)    GFR calc Af Amer 27 (*)    All other components within normal limits  BRAIN NATRIURETIC PEPTIDE - Abnormal; Notable for the following components:   B Natriuretic Peptide 176.0 (*)    All other components within normal limits  TROPONIN I - Abnormal; Notable for the following components:   Troponin I 0.04 (*)    All other components within normal limits  BLOOD GAS,  ARTERIAL - Abnormal; Notable for the following components:   pO2, Arterial 56 (*)    All other components within normal limits  CBC WITH DIFFERENTIAL/PLATELET - Abnormal; Notable for the following components:   WBC 29.4 (*)    RBC 3.56 (*)    Hemoglobin 11.0 (*)    HCT 34.3 (*)    RDW 19.3 (*)    Neutro Abs 28.0 (*)    Lymphs Abs 0.3 (*)    Monocytes Absolute 1.0 (*)    All other components within normal limits  GLUCOSE, CAPILLARY - Abnormal; Notable for the following components:   Glucose-Capillary 156 (*)    All other components within normal limits  CBC - Abnormal; Notable for the following components:   WBC 31.6 (*)    RBC 3.39 (*)    Hemoglobin 10.6 (*)    HCT 32.7 (*)    RDW 19.4 (*)    All other components within normal limits  BASIC METABOLIC PANEL - Abnormal; Notable for the following components:   Glucose, Bld 123 (*)    BUN 49 (*)    Creatinine, Ser 1.62 (*)    Calcium 6.0 (*)    GFR calc non Af Amer 30 (*)    GFR calc Af Amer 34 (*)    All other components within normal limits  GLUCOSE, CAPILLARY - Abnormal; Notable for the following components:   Glucose-Capillary 174 (*)  All other components within normal limits  GLUCOSE, CAPILLARY - Abnormal; Notable for the following components:   Glucose-Capillary 121 (*)    All other components within normal limits  GLUCOSE, CAPILLARY - Abnormal; Notable for the following components:   Glucose-Capillary 125 (*)    All other components within normal limits  GLUCOSE, CAPILLARY - Abnormal; Notable for the following components:   Glucose-Capillary 105 (*)    All other components within normal limits  GLUCOSE, CAPILLARY - Abnormal; Notable for the following components:   Glucose-Capillary 101 (*)    All other components within normal limits  GLUCOSE, CAPILLARY - Abnormal; Notable for the following components:   Glucose-Capillary 130 (*)    All other components within normal limits  CBC - Abnormal; Notable for the  following components:   WBC 23.6 (*)    RBC 3.22 (*)    Hemoglobin 9.8 (*)    HCT 30.8 (*)    MCHC 31.9 (*)    RDW 18.9 (*)    All other components within normal limits  COMPREHENSIVE METABOLIC PANEL - Abnormal; Notable for the following components:   BUN 34 (*)    Creatinine, Ser 1.32 (*)    Calcium 5.6 (*)    Total Protein 4.7 (*)    Albumin 1.9 (*)    Alkaline Phosphatase 292 (*)    GFR calc non Af Amer 38 (*)    GFR calc Af Amer 44 (*)    All other components within normal limits  GLUCOSE, CAPILLARY - Abnormal; Notable for the following components:   Glucose-Capillary 106 (*)    All other components within normal limits  GLUCOSE, CAPILLARY - Abnormal; Notable for the following components:   Glucose-Capillary 116 (*)    All other components within normal limits  BASIC METABOLIC PANEL - Abnormal; Notable for the following components:   CO2 21 (*)    BUN 27 (*)    Creatinine, Ser 1.31 (*)    Calcium 5.6 (*)    GFR calc non Af Amer 38 (*)    GFR calc Af Amer 45 (*)    All other components within normal limits  CBC - Abnormal; Notable for the following components:   WBC 14.7 (*)    RBC 3.41 (*)    Hemoglobin 10.6 (*)    HCT 33.1 (*)    RDW 19.2 (*)    All other components within normal limits  GLUCOSE, CAPILLARY - Abnormal; Notable for the following components:   Glucose-Capillary 137 (*)    All other components within normal limits  GLUCOSE, CAPILLARY - Abnormal; Notable for the following components:   Glucose-Capillary 136 (*)    All other components within normal limits  GLUCOSE, CAPILLARY - Abnormal; Notable for the following components:   Glucose-Capillary 110 (*)    All other components within normal limits  CALCIUM, IONIZED - Abnormal; Notable for the following components:   Calcium, Ionized, Serum 3.8 (*)    All other components within normal limits  BASIC METABOLIC PANEL - Abnormal; Notable for the following components:   CO2 21 (*)    Calcium 5.9 (*)     GFR calc non Af Amer 57 (*)    All other components within normal limits  GLUCOSE, CAPILLARY - Abnormal; Notable for the following components:   Glucose-Capillary 113 (*)    All other components within normal limits  BASIC METABOLIC PANEL - Abnormal; Notable for the following components:   Potassium 5.3 (*)    CO2 21 (*)  Creatinine, Ser 1.05 (*)    Calcium 6.4 (*)    GFR calc non Af Amer 50 (*)    GFR calc Af Amer 58 (*)    All other components within normal limits  ALBUMIN - Abnormal; Notable for the following components:   Albumin 2.2 (*)    All other components within normal limits  GLUCOSE, CAPILLARY - Abnormal; Notable for the following components:   Glucose-Capillary 114 (*)    All other components within normal limits  BASIC METABOLIC PANEL - Abnormal; Notable for the following components:   Calcium 6.3 (*)    GFR calc non Af Amer 57 (*)    All other components within normal limits  CBC - Abnormal; Notable for the following components:   RBC 2.93 (*)    Hemoglobin 9.2 (*)    HCT 28.1 (*)    RDW 18.4 (*)    All other components within normal limits  TRANSFERRIN - Abnormal; Notable for the following components:   Transferrin 98 (*)    All other components within normal limits  GLUCOSE, CAPILLARY - Abnormal; Notable for the following components:   Glucose-Capillary 115 (*)    All other components within normal limits  GLUCOSE, CAPILLARY - Abnormal; Notable for the following components:   Glucose-Capillary 103 (*)    All other components within normal limits  BASIC METABOLIC PANEL - Abnormal; Notable for the following components:   Calcium 6.1 (*)    All other components within normal limits  GLUCOSE, CAPILLARY - Abnormal; Notable for the following components:   Glucose-Capillary 117 (*)    All other components within normal limits  GLUCOSE, CAPILLARY - Abnormal; Notable for the following components:   Glucose-Capillary 128 (*)    All other components within normal  limits  GLUCOSE, CAPILLARY - Abnormal; Notable for the following components:   Glucose-Capillary 147 (*)    All other components within normal limits  BASIC METABOLIC PANEL - Abnormal; Notable for the following components:   Glucose, Bld 104 (*)    Calcium 5.9 (*)    All other components within normal limits  CBC - Abnormal; Notable for the following components:   RBC 2.74 (*)    Hemoglobin 8.6 (*)    HCT 26.7 (*)    RDW 18.6 (*)    All other components within normal limits  GLUCOSE, CAPILLARY - Abnormal; Notable for the following components:   Glucose-Capillary 110 (*)    All other components within normal limits  MAGNESIUM - Abnormal; Notable for the following components:   Magnesium 1.6 (*)    All other components within normal limits  PHOSPHORUS - Abnormal; Notable for the following components:   Phosphorus 2.4 (*)    All other components within normal limits  GLUCOSE, CAPILLARY - Abnormal; Notable for the following components:   Glucose-Capillary 135 (*)    All other components within normal limits  GLUCOSE, CAPILLARY - Abnormal; Notable for the following components:   Glucose-Capillary 129 (*)    All other components within normal limits  BASIC METABOLIC PANEL - Abnormal; Notable for the following components:   Glucose, Bld 104 (*)    Calcium 6.7 (*)    All other components within normal limits  ALBUMIN - Abnormal; Notable for the following components:   Albumin 2.4 (*)    All other components within normal limits  CBC - Abnormal; Notable for the following components:   RBC 3.09 (*)    Hemoglobin 9.6 (*)    HCT  30.0 (*)    RDW 18.3 (*)    All other components within normal limits  GLUCOSE, CAPILLARY - Abnormal; Notable for the following components:   Glucose-Capillary 124 (*)    All other components within normal limits  GLUCOSE, CAPILLARY - Abnormal; Notable for the following components:   Glucose-Capillary 120 (*)    All other components within normal limits    BASIC METABOLIC PANEL - Abnormal; Notable for the following components:   Potassium 5.7 (*)    Glucose, Bld 114 (*)    Calcium 6.5 (*)    All other components within normal limits  GLUCOSE, CAPILLARY - Abnormal; Notable for the following components:   Glucose-Capillary 162 (*)    All other components within normal limits  GLUCOSE, CAPILLARY - Abnormal; Notable for the following components:   Glucose-Capillary 119 (*)    All other components within normal limits  GLUCOSE, CAPILLARY - Abnormal; Notable for the following components:   Glucose-Capillary 110 (*)    All other components within normal limits  GLUCOSE, CAPILLARY - Abnormal; Notable for the following components:   Glucose-Capillary 123 (*)    All other components within normal limits  AEROBIC/ANAEROBIC CULTURE (SURGICAL/DEEP WOUND)  CULTURE, BLOOD (ROUTINE X 2)  CULTURE, BLOOD (ROUTINE X 2)  LACTIC ACID, PLASMA  LACTIC ACID, PLASMA  MAGNESIUM  PROCALCITONIN  PROCALCITONIN  GLUCOSE, CAPILLARY  GLUCOSE, CAPILLARY  GLUCOSE, CAPILLARY  GLUCOSE, CAPILLARY  GLUCOSE, CAPILLARY  GLUCOSE, CAPILLARY  GLUCOSE, CAPILLARY  GLUCOSE, CAPILLARY  MAGNESIUM  GLUCOSE, CAPILLARY  GLUCOSE, CAPILLARY  GLUCOSE, CAPILLARY  GLUCOSE, CAPILLARY  GLUCOSE, CAPILLARY  GLUCOSE, CAPILLARY  GLUCOSE, CAPILLARY  GLUCOSE, CAPILLARY  GLUCOSE, CAPILLARY  FERRITIN  GLUCOSE, CAPILLARY  GLUCOSE, CAPILLARY  GLUCOSE, CAPILLARY  GLUCOSE, CAPILLARY  MAGNESIUM  PHOSPHORUS  GLUCOSE, CAPILLARY  MAGNESIUM  PHOSPHORUS  GLUCOSE, CAPILLARY  POTASSIUM   ____________________________________________  EKG EKG read interpreted by me shows normal sinus rhythm rate of 78 normal axis low amplitude in the chest leads but otherwise no acute changes ____________________________________________  RADIOLOGY  ED MD interpretation:    Official radiology report(s): No results  found.  ____________________________________________   PROCEDURES  Procedure(s) performed:   Procedures  Critical Care performed:  Critical care time 45 minutes including time spent in the room trying to get the patient's blood pressure up discussing the patient with multiple physicians  ____________________________________________   INITIAL IMPRESSION / ASSESSMENT AND PLAN / ED COURSE Patient's blood pressure drops.  Patient still awake but sleepy as she has been.  Urine comes out is very cloudy.  We will give her some fluid and then some antibiotics Zosyn since she is allergic to Tequin and Keflex.  Her husband does not remember what the allergy to Keflex is patient does not think she is allergic to Keflex but it is also in the computer.  If her pressure does not come up rapidly with fluids we will give her some Levophed.   ----------------------------------------- 4:42 PM on 08/15/2017 -----------------------------------------  Patient is now doing better.  We have given her Zosyn for the sepsis.  Dr. Roxan Hockeyobinson put the central and line inform me as I was afraid I would not be able to get an well.  It is doing well blood pressures up she is more alert  Clinical Course as of Sep 06 1236  Sun Aug 15, 2017  1641 I-Stat CG4 Lactic Acid, ED  (not [facility]) [PR]  1732 Alkaline Phosphatase: 104 [PR]    Clinical Course User Index [PR]  Willy Eddy, MD     ____________________________________________   FINAL CLINICAL IMPRESSION(S) / ED DIAGNOSES  Final diagnoses:  Urinary tract infection without hematuria, site unspecified  Sepsis, due to unspecified organism [facility])     ED Discharge Orders        Ordered    bisacodyl (DULCOLAX) 5 MG EC tablet  Daily     08/26/17 1035    folic acid (FOLVITE) 1 MG tablet  Daily     08/26/17 1035    Increase activity slowly     08/26/17 0732    Diet - low sodium heart healthy     08/26/17 0732    oxyCODONE (ROXICODONE) 15 MG  immediate release tablet  Every 6 hours     08/26/17 1035    docusate sodium (COLACE) 100 MG capsule  2 times daily     08/26/17 1035    calcium carbonate (TUMS - DOSED IN MG ELEMENTAL CALCIUM) 500 MG chewable tablet  3 times daily with meals     08/26/17 1035    gabapentin (NEURONTIN) 400 MG capsule  3 times daily     08/26/17 1035    sulfamethoxazole-trimethoprim (BACTRIM DS,SEPTRA DS) 800-160 MG tablet  Every 12 hours     08/26/17 1039       Note:  This document was prepared using Dragon voice recognition software and may include unintentional dictation errors.    Arnaldo Natal, MD 08/15/17 1641    Arnaldo Natal, MD 08/15/17 1709    Arnaldo Natal, MD 08/15/17 1710    Arnaldo Natal, MD 09/06/17 [phone]

## 2017-08-16 LAB — GLUCOSE, CAPILLARY
GLUCOSE-CAPILLARY: 106 mg/dL — AB (ref 65–99)
GLUCOSE-CAPILLARY: 116 mg/dL — AB (ref 65–99)
GLUCOSE-CAPILLARY: 121 mg/dL — AB (ref 65–99)
GLUCOSE-CAPILLARY: 125 mg/dL — AB (ref 65–99)
Glucose-Capillary: 105 mg/dL — ABNORMAL HIGH (ref 65–99)
Glucose-Capillary: 101 mg/dL — ABNORMAL HIGH (ref 65–99)
Glucose-Capillary: 130 mg/dL — ABNORMAL HIGH (ref 65–99)

## 2017-08-16 LAB — CBC
HCT: 32.7 % — ABNORMAL LOW (ref 35.0–47.0)
HEMOGLOBIN: 10.6 g/dL — AB (ref 12.0–16.0)
MCH: 31.3 pg (ref 26.0–34.0)
MCHC: 32.4 g/dL (ref 32.0–36.0)
MCV: 96.5 fL (ref 80.0–100.0)
PLATELETS: 279 10*3/uL (ref 150–440)
RBC: 3.39 MIL/uL — AB (ref 3.80–5.20)
RDW: 19.4 % — ABNORMAL HIGH (ref 11.5–14.5)
WBC: 31.6 10*3/uL — AB (ref 3.6–11.0)

## 2017-08-16 LAB — BLOOD CULTURE ID PANEL (REFLEXED)
Acinetobacter baumannii: NOT DETECTED
CANDIDA GLABRATA: NOT DETECTED
CANDIDA KRUSEI: NOT DETECTED
CANDIDA PARAPSILOSIS: NOT DETECTED
Candida albicans: NOT DETECTED
Candida tropicalis: NOT DETECTED
ENTEROBACTER CLOACAE COMPLEX: NOT DETECTED
ENTEROCOCCUS SPECIES: NOT DETECTED
Enterobacteriaceae species: NOT DETECTED
Escherichia coli: NOT DETECTED
Haemophilus influenzae: NOT DETECTED
KLEBSIELLA OXYTOCA: NOT DETECTED
Klebsiella pneumoniae: NOT DETECTED
Listeria monocytogenes: NOT DETECTED
Methicillin resistance: NOT DETECTED
Neisseria meningitidis: NOT DETECTED
PROTEUS SPECIES: NOT DETECTED
Pseudomonas aeruginosa: NOT DETECTED
SERRATIA MARCESCENS: NOT DETECTED
STAPHYLOCOCCUS SPECIES: DETECTED — AB
STREPTOCOCCUS AGALACTIAE: NOT DETECTED
STREPTOCOCCUS PNEUMONIAE: NOT DETECTED
Staphylococcus aureus (BCID): NOT DETECTED
Streptococcus pyogenes: NOT DETECTED
Streptococcus species: NOT DETECTED

## 2017-08-16 LAB — BASIC METABOLIC PANEL
ANION GAP: 9 (ref 5–15)
BUN: 49 mg/dL — AB (ref 6–20)
CO2: 24 mmol/L (ref 22–32)
Calcium: 6 mg/dL — CL (ref 8.9–10.3)
Chloride: 103 mmol/L (ref 101–111)
Creatinine, Ser: 1.62 mg/dL — ABNORMAL HIGH (ref 0.44–1.00)
GFR, EST AFRICAN AMERICAN: 34 mL/min — AB (ref >60)
GFR, EST NON AFRICAN AMERICAN: 30 mL/min — AB (ref >60)
Glucose, Bld: 123 mg/dL — ABNORMAL HIGH (ref 65–99)
POTASSIUM: 4.7 mmol/L (ref 3.5–5.1)
SODIUM: 136 mmol/L (ref 135–145)

## 2017-08-16 LAB — MRSA PCR SCREENING: MRSA BY PCR: POSITIVE — AB

## 2017-08-16 LAB — PROCALCITONIN: PROCALCITONIN: 0.69 ng/mL

## 2017-08-16 MED ORDER — DOCUSATE SODIUM 100 MG PO CAPS
100.0000 mg | ORAL_CAPSULE | Freq: Two times a day (BID) | ORAL | Status: DC
  Administered 2017-08-16 – 2017-08-25 (×15): 100 mg via ORAL
  Filled 2017-08-16 (×18): qty 1

## 2017-08-16 MED ORDER — ORAL CARE MOUTH RINSE: 15.0000 mL | Freq: Two times a day (BID) | OROMUCOSAL | Status: DC

## 2017-08-16 MED ORDER — CHLORHEXIDINE GLUCONATE CLOTH 2 % EX PADS
6.0000 | MEDICATED_PAD | Freq: Every day | CUTANEOUS | Status: AC
  Administered 2017-08-17 – 2017-08-21 (×3): 6 via TOPICAL

## 2017-08-16 MED ORDER — INSULIN ASPART 100 UNIT/ML ~~LOC~~ SOLN
0.0000 [IU] | Freq: Three times a day (TID) | SUBCUTANEOUS | Status: DC
  Administered 2017-08-16 – 2017-08-24 (×5): 2 [IU] via SUBCUTANEOUS
  Administered 2017-08-25: 3 [IU] via SUBCUTANEOUS
  Administered 2017-08-26: 2 [IU] via SUBCUTANEOUS
  Filled 2017-08-16 (×7): qty 1

## 2017-08-16 MED ORDER — SODIUM CHLORIDE 0.9 % IV SOLN
1.0000 g | Freq: Once | INTRAVENOUS | Status: AC
  Administered 2017-08-16: 1 g via INTRAVENOUS
  Filled 2017-08-16 (×2): qty 10

## 2017-08-16 MED ORDER — LACTATED RINGERS IV SOLN
INTRAVENOUS | Status: DC
  Administered 2017-08-16: 10:00:00 via INTRAVENOUS

## 2017-08-16 MED ORDER — MUPIROCIN 2 % EX OINT
1.0000 "application " | TOPICAL_OINTMENT | Freq: Two times a day (BID) | CUTANEOUS | Status: AC
  Administered 2017-08-16 – 2017-08-21 (×10): 1 via NASAL
  Filled 2017-08-16: qty 22

## 2017-08-16 MED ORDER — ENOXAPARIN SODIUM 30 MG/0.3ML ~~LOC~~ SOLN
30.0000 mg | SUBCUTANEOUS | Status: DC
  Administered 2017-08-16: 30 mg via SUBCUTANEOUS
  Filled 2017-08-16: qty 0.3

## 2017-08-16 MED ORDER — SODIUM CHLORIDE 0.9% FLUSH
10.0000 mL | Freq: Two times a day (BID) | INTRAVENOUS | Status: DC
  Administered 2017-08-16: 10 mL
  Administered 2017-08-17: 30 mL

## 2017-08-16 MED ORDER — THEOPHYLLINE ER 400 MG PO TB24
400.0000 mg | ORAL_TABLET | Freq: Every day | ORAL | Status: DC
  Administered 2017-08-17 – 2017-08-26 (×10): 400 mg via ORAL
  Filled 2017-08-16 (×12): qty 1

## 2017-08-16 MED ORDER — SODIUM CHLORIDE 0.9% FLUSH: 10.0000 mL | INTRAVENOUS | Status: DC | PRN

## 2017-08-16 MED ORDER — INSULIN ASPART 100 UNIT/ML ~~LOC~~ SOLN: 0.0000 [IU] | Freq: Every day | SUBCUTANEOUS | Status: DC

## 2017-08-16 MED ORDER — IPRATROPIUM-ALBUTEROL 0.5-2.5 (3) MG/3ML IN SOLN: 3.0000 mL | Freq: Four times a day (QID) | RESPIRATORY_TRACT | Status: DC | PRN

## 2017-08-16 NOTE — Progress Notes (Signed)
PHARMACY - PHYSICIAN COMMUNICATION CRITICAL VALUE ALERT - BLOOD CULTURE IDENTIFICATION (BCID)  Brenda NoJamie Graves Glenn is an 77 y.o. female who [facility]2017 with a chief complaint of increasing edema and failure to urinate x 2 days  Assessment:  Likely contaminant, staph species (include suspected source if known)  Name of physician (or Provider) Contacted: Dr Auburn BilberryShreyang Patel  Current antibiotics: merrem 500mg  iv q12h   Changes to prescribed antibiotics recommended:  Recommendations declined by provider due to believes contamination  Results for orders placed or performed during the hospital encounter of 08/15/17  Blood Culture ID Panel (Reflexed) (Collected: 08/15/2017  3:12 PM)  Result Value Ref Range   Enterococcus species NOT DETECTED NOT DETECTED   Listeria monocytogenes NOT DETECTED NOT DETECTED   Staphylococcus species DETECTED (A) NOT DETECTED   Staphylococcus aureus NOT DETECTED NOT DETECTED   Methicillin resistance NOT DETECTED NOT DETECTED   Streptococcus species NOT DETECTED NOT DETECTED   Streptococcus agalactiae NOT DETECTED NOT DETECTED   Streptococcus pneumoniae NOT DETECTED NOT DETECTED   Streptococcus pyogenes NOT DETECTED NOT DETECTED   Acinetobacter baumannii NOT DETECTED NOT DETECTED   Enterobacteriaceae species NOT DETECTED NOT DETECTED   Enterobacter cloacae complex NOT DETECTED NOT DETECTED   Escherichia coli NOT DETECTED NOT DETECTED   Klebsiella oxytoca NOT DETECTED NOT DETECTED   Klebsiella pneumoniae NOT DETECTED NOT DETECTED   Proteus species NOT DETECTED NOT DETECTED   Serratia marcescens NOT DETECTED NOT DETECTED   Haemophilus influenzae NOT DETECTED NOT DETECTED   Neisseria meningitidis NOT DETECTED NOT DETECTED   Pseudomonas aeruginosa NOT DETECTED NOT DETECTED   Candida albicans NOT DETECTED NOT DETECTED   Candida glabrata NOT DETECTED NOT DETECTED   Candida krusei NOT DETECTED NOT DETECTED   Candida parapsilosis NOT DETECTED  NOT DETECTED   Candida tropicalis NOT DETECTED NOT DETECTED    Brenda Glenn 08/16/2017  7:47 PM

## 2017-08-16 NOTE — Progress Notes (Signed)
Questions on central line placement from chest XR in ED. Report from nurse saying that MD verified placement and Levophed was running into line upon admission to unit. Upon arrival to ICU, NP verified placement of central line by CXR. Go ahead given to keep using line.

## 2017-08-17 ENCOUNTER — Ambulatory Visit: Payer: Medicare Other

## 2017-08-17 DIAGNOSIS — A419 Sepsis, unspecified organism: Secondary | ICD-10-CM

## 2017-08-17 DIAGNOSIS — R6521 Severe sepsis with septic shock: Secondary | ICD-10-CM

## 2017-08-17 LAB — COMPREHENSIVE METABOLIC PANEL
ALT: 29 U/L (ref 14–54)
AST: 41 U/L (ref 15–41)
Albumin: 1.9 g/dL — ABNORMAL LOW (ref 3.5–5.0)
Alkaline Phosphatase: 292 U/L — ABNORMAL HIGH (ref 38–126)
Anion gap: 8 (ref 5–15)
BILIRUBIN TOTAL: 0.7 mg/dL (ref 0.3–1.2)
BUN: 34 mg/dL — AB (ref 6–20)
CHLORIDE: 108 mmol/L (ref 101–111)
CO2: 22 mmol/L (ref 22–32)
Calcium: 5.6 mg/dL — CL (ref 8.9–10.3)
Creatinine, Ser: 1.32 mg/dL — ABNORMAL HIGH (ref 0.44–1.00)
GFR, EST AFRICAN AMERICAN: 44 mL/min — AB (ref >60)
GFR, EST NON AFRICAN AMERICAN: 38 mL/min — AB (ref >60)
Glucose, Bld: 99 mg/dL (ref 65–99)
POTASSIUM: 4 mmol/L (ref 3.5–5.1)
Sodium: 138 mmol/L (ref 135–145)
TOTAL PROTEIN: 4.7 g/dL — AB (ref 6.5–8.1)

## 2017-08-17 LAB — CBC
HEMATOCRIT: 30.8 % — AB (ref 35.0–47.0)
Hemoglobin: 9.8 g/dL — ABNORMAL LOW (ref 12.0–16.0)
MCH: 30.6 pg (ref 26.0–34.0)
MCHC: 31.9 g/dL — AB (ref 32.0–36.0)
MCV: 95.8 fL (ref 80.0–100.0)
PLATELETS: 261 10*3/uL (ref 150–440)
RBC: 3.22 MIL/uL — ABNORMAL LOW (ref 3.80–5.20)
RDW: 18.9 % — AB (ref 11.5–14.5)
WBC: 23.6 10*3/uL — AB (ref 3.6–11.0)

## 2017-08-17 LAB — GLUCOSE, CAPILLARY
GLUCOSE-CAPILLARY: 76 mg/dL (ref 65–99)
GLUCOSE-CAPILLARY: 137 mg/dL — AB (ref 65–99)
Glucose-Capillary: 80 mg/dL (ref 65–99)
Glucose-Capillary: 88 mg/dL (ref 65–99)

## 2017-08-17 LAB — PROCALCITONIN: Procalcitonin: 0.68 ng/mL

## 2017-08-17 MED ORDER — OXYCODONE HCL 5 MG PO TABS
10.0000 mg | ORAL_TABLET | Freq: Four times a day (QID) | ORAL | Status: DC | PRN
  Administered 2017-08-17 – 2017-08-18 (×3): 10 mg via ORAL
  Filled 2017-08-17 (×3): qty 2

## 2017-08-17 MED ORDER — ENOXAPARIN SODIUM 40 MG/0.4ML ~~LOC~~ SOLN
40.0000 mg | SUBCUTANEOUS | Status: DC
  Administered 2017-08-17 – 2017-08-19 (×3): 40 mg via SUBCUTANEOUS
  Filled 2017-08-17 (×3): qty 0.4

## 2017-08-17 MED ORDER — DILTIAZEM HCL 25 MG/5ML IV SOLN
10.0000 mg | Freq: Once | INTRAVENOUS | Status: AC
Start: 2017-08-17 — End: 2017-08-17
  Administered 2017-08-17: 10 mg via INTRAVENOUS
  Filled 2017-08-17 (×2): qty 5

## 2017-08-17 MED ORDER — METOPROLOL SUCCINATE ER 50 MG PO TB24: 50.0000 mg | ORAL_TABLET | Freq: Every day | ORAL | Status: DC

## 2017-08-17 MED ORDER — METOPROLOL SUCCINATE ER 100 MG PO TB24
100.0000 mg | ORAL_TABLET | Freq: Every day | ORAL | Status: DC
  Administered 2017-08-17 – 2017-08-26 (×10): 100 mg via ORAL
  Filled 2017-08-17 (×2): qty 1
  Filled 2017-08-17: qty 2
  Filled 2017-08-17 (×6): qty 1
  Filled 2017-08-17: qty 2

## 2017-08-17 NOTE — Progress Notes (Signed)
[facility] - [place] at [facility]   PATIENT NAME: Brenda Glenn    MR#:  [id number]  DATE OF BIRTH:  1940-10-02  SUBJECTIVE:  CHIEF COMPLAINT:   Chief Complaint  Patient presents with  . Urinary Retention  . Multiple Complaints   Tearful. Complains of back pain OIff pressors. But still low blood pressure  REVIEW OF SYSTEMS:    Review of Systems  Constitutional: Positive for malaise/fatigue. Negative for chills and fever.  HENT: Negative for sore throat.   Eyes: Negative for blurred vision, double vision and pain.  Respiratory: Negative for cough, hemoptysis, shortness of breath and wheezing.   Cardiovascular: Negative for chest pain, palpitations, orthopnea and leg swelling.  Gastrointestinal: Negative for abdominal pain, constipation, diarrhea, heartburn, nausea and vomiting.  Genitourinary: Negative for dysuria and hematuria.  Musculoskeletal: Positive for back pain. Negative for joint pain.  Skin: Negative for rash.  Neurological: Negative for sensory change, speech change, focal weakness and headaches.  Endo/Heme/Allergies: Does not bruise/bleed easily.  Psychiatric/Behavioral: Negative for depression. The patient is not nervous/anxious.     DRUG ALLERGIES:   Allergies  Allergen Reactions  . Tequin [Gatifloxacin] Shortness Of Breath  . Bupropion     Other reaction(s): Other (See Comments) shaking  . Keflex [Cephalexin] Other (See Comments)    Unknown  . Lisinopril Other (See Comments)    Unknown  . Varenicline Nausea Only and Nausea And Vomiting    VITALS:  Blood pressure (!) 85/51, pulse 80, temperature 97.7 F (36.5 C), temperature source Oral, resp. rate 17, height 4\' 11"  (1.499 m), weight 83.9 kg (185 lb), SpO2 92 %.  PHYSICAL EXAMINATION:   Physical Exam  GENERAL:  77 y.o.-year-old patient lying in the bed. obese  EYES: Pupils equal, round, reactive to light and accommodation. No scleral icterus. Extraocular muscles intact.  HEENT:  Head atraumatic, normocephalic. Oropharynx and nasopharynx clear.  NECK:  Supple, no jugular venous distention. No thyroid enlargement, no tenderness.  LUNGS: Normal breath sounds bilaterally, no wheezing, rales, rhonchi. No use of accessory muscles of respiration.  CARDIOVASCULAR: S1, S2 normal. No murmurs, rubs, or gallops.  ABDOMEN: Soft, nontender, nondistended. Bowel sounds present. No organomegaly or mass.  EXTREMITIES: No cyanosis, clubbing or edema b/l.    NEUROLOGIC: Moves all 4 extremities PSYCHIATRIC: The patient is alert and awake SKIN: No obvious rash, lesion, or ulcer.   LABORATORY PANEL:   CBC Recent Labs  Lab 08/17/17 0350  WBC 23.6*  HGB 9.8*  HCT 30.8*  PLT 261   ------------------------------------------------------------------------------------------------------------------ Chemistries  Recent Labs  Lab 08/15/17 1411  08/17/17 0350  NA 132*   < > 138  K 4.8   < > 4.0  CL 98*   < > 108  CO2 25   < > 22  GLUCOSE 130*   < > 99  BUN 55*   < > 34*  CREATININE 1.97*   < > 1.32*  CALCIUM 6.2*   < > 5.6*  MG 2.2  --   --   AST 44*  --  41  ALT 18  --  29  ALKPHOS 104  --  292*  BILITOT 0.7  --  0.7   < > = values in this interval not displayed.   ------------------------------------------------------------------------------------------------------------------  Cardiac Enzymes Recent Labs  Lab 08/15/17 1411  TROPONINI 0.04*   ------------------------------------------------------------------------------------------------------------------  RADIOLOGY:  No results found.   ASSESSMENT AND PLAN:   Patient is 77 year old white female with COPD chronic respiratory failure with sepsis  *  Septic shock due to UTI On IV abx Cx pending Will need one more day in ICU due to continued hypotension  * Diabetes type 2 placed on sliding scale insulin  * COPD continue oxygen and inhalers as doing at home  * Hyperlipidemia continue home  regimen  Critically ill  All the records are reviewed and case discussed with Care Management/Social Worker Management plans discussed with the patient, family and they are in agreement.  CODE STATUS: FULL CODE  DVT Prophylaxis: SCDs  TOTAL TIME TAKING CARE OF THIS PATIENT: 35 minutes.   POSSIBLE D/C IN 2-3 DAYS, DEPENDING ON CLINICAL CONDITION.  Orie Fisherman M.D on 08/17/2017 at 11:12 PM  Between 7am to 6pm - Pager - [phone]  After 6pm go to [email or web address] - password [email or web address] [facility]  [facility] [place] [facility]  Office  [phone]  CC: Primary care physician; Mickey Farber, MD  Note: This dictation was prepared with Dragon dictation along with smaller phrase technology. Any transcriptional errors that result from this process are unintentional.

## 2017-08-17 NOTE — Progress Notes (Signed)
She has been off vasopressors since last night.  She is somewhat cantankerous this morning but otherwise no distress.  She is requesting her oxycodone (has chronic pain syndrome).  She is also requesting that her diet be advanced.  Vitals:   08/17/17 1045 08/17/17 1100 08/17/17 1130 08/17/17 1157  BP: (!) 89/60 (!) 93/56 (!) 96/49   Pulse: 92 84 82 84  Resp: 17 14 14 13   Temp:    97.6 F (36.4 C)  TempSrc:    Oral  SpO2: 94% 97% 97%   Weight:      Height:        Gen: NAD HEENT: NCAT, sclera white Neck: No JVD Lungs: breath sounds full, no wheezes or other adventitious sounds Cardiovascular: RRR, no murmurs Abdomen: Soft, nontender, normal BS Ext: without clubbing, cyanosis, edema Neuro: grossly intact Skin: Limited exam, no lesions noted   BMP Latest Ref Rng & Units 08/17/2017 08/16/2017 08/15/2017  Glucose 65 - 99 mg/dL 99 409(W123(H) 119(J130(H)  BUN 6 - 20 mg/dL 47(W34(H) 29(F49(H) 62(Z55(H)  Creatinine 0.44 - 1.00 mg/dL 3.08(M1.32(H) 5.78(I1.62(H) 6.96(E1.97(H)  Sodium 135 - 145 mmol/L 138 136 132(L)  Potassium 3.5 - 5.1 mmol/L 4.0 4.7 4.8  Chloride 101 - 111 mmol/L 108 103 98(L)  CO2 22 - 32 mmol/L 22 24 25   Calcium 8.9 - 10.3 mg/dL 5.6(LL) 6.0(LL) 6.2(LL)    CBC Latest Ref Rng & Units 08/17/2017 08/16/2017 08/15/2017  WBC 3.6 - 11.0 K/uL 23.6(H) 31.6(H) 29.4(H)  Hemoglobin 12.0 - 16.0 g/dL 9.5(M9.8(L) 10.6(L) 11.0(L)  Hematocrit 35.0 - 47.0 % 30.8(L) 32.7(L) 34.3(L)  Platelets 150 - 440 K/uL 261 279 271    CXR: No new film   IMPRESSION: Acute urinary retention Urinary tract infection, Proteus (sensitivities pending) Severe sepsis with septic shock, resolved AKI, resolving Acute encephalopathy, resolved Chronic pain syndrome Type 2 diabetes, controlled Oxygen dependent COPD without wheezing  PLAN/REC: Continue meropenem pending sensitivities and narrow accordingly Will leave Foley catheter in place due to presentation with urinary retention Resume oxycodone as needed Continue SSI Advance diet and  activity PT evaluation requested Transfer to MedSurg floor with cardiac monitoring  After transfer, PCCM will sign off. Please call if we can be of further assistance    Billy Fischeravid Jasmon Mattice, MD PCCM service Mobile [phone] Pager [phone] 08/17/2017 12:05 PM

## 2017-08-17 NOTE — Progress Notes (Signed)
[facility] - [place] at [facility]   PATIENT NAME: Brenda Glenn    MR#:  [id number]  DATE OF BIRTH:  [birth year]  SUBJECTIVE:  CHIEF COMPLAINT:   Chief Complaint  Patient presents with  . Urinary Retention  . Multiple Complaints   Feels better. On levophed  REVIEW OF SYSTEMS:    Review of Systems  Constitutional: Positive for malaise/fatigue. Negative for chills and fever.  HENT: Negative for sore throat.   Eyes: Negative for blurred vision, double vision and pain.  Respiratory: Negative for cough, hemoptysis, shortness of breath and wheezing.   Cardiovascular: Negative for chest pain, palpitations, orthopnea and leg swelling.  Gastrointestinal: Negative for abdominal pain, constipation, diarrhea, heartburn, nausea and vomiting.  Genitourinary: Negative for dysuria and hematuria.  Musculoskeletal: Positive for back pain. Negative for joint pain.  Skin: Negative for rash.  Neurological: Negative for sensory change, speech change, focal weakness and headaches.  Endo/Heme/Allergies: Does not bruise/bleed easily.  Psychiatric/Behavioral: Negative for depression. The patient is not nervous/anxious.     DRUG ALLERGIES:   Allergies  Allergen Reactions  . Tequin [Gatifloxacin] Shortness Of Breath  . Bupropion     Other reaction(s): Other (See Comments) shaking  . Keflex [Cephalexin] Other (See Comments)    Unknown  . Lisinopril Other (See Comments)    Unknown  . Varenicline Nausea Only and Nausea And Vomiting    VITALS:  Blood pressure (!) 85/51, pulse 80, temperature 97.7 F (36.5 C), temperature source Oral, resp. rate 17, height 4\' 11"  (1.499 m), weight 83.9 kg (185 lb), SpO2 92 %.  PHYSICAL EXAMINATION:   Physical Exam  GENERAL:  77 y.o.-year-old patient lying in the bed. obese  EYES: Pupils equal, round, reactive to light and accommodation. No scleral icterus. Extraocular muscles intact.  HEENT: Head atraumatic, normocephalic. Oropharynx and  nasopharynx clear.  NECK:  Supple, no jugular venous distention. No thyroid enlargement, no tenderness.  LUNGS: Normal breath sounds bilaterally, no wheezing, rales, rhonchi. No use of accessory muscles of respiration.  CARDIOVASCULAR: S1, S2 normal. No murmurs, rubs, or gallops.  ABDOMEN: Soft, nontender, nondistended. Bowel sounds present. No organomegaly or mass.  EXTREMITIES: No cyanosis, clubbing or edema b/l.    NEUROLOGIC: Moves all 4 extremities PSYCHIATRIC: The patient is alert and awake SKIN: No obvious rash, lesion, or ulcer.   LABORATORY PANEL:   CBC Recent Labs  Lab 08/17/17 0350  WBC 23.6*  HGB 9.8*  HCT 30.8*  PLT 261   ------------------------------------------------------------------------------------------------------------------ Chemistries  Recent Labs  Lab 08/15/17 1411  08/17/17 0350  NA 132*   < > 138  K 4.8   < > 4.0  CL 98*   < > 108  CO2 25   < > 22  GLUCOSE 130*   < > 99  BUN 55*   < > 34*  CREATININE 1.97*   < > 1.32*  CALCIUM 6.2*   < > 5.6*  MG 2.2  --   --   AST 44*  --  41  ALT 18  --  29  ALKPHOS 104  --  292*  BILITOT 0.7  --  0.7   < > = values in this interval not displayed.   ------------------------------------------------------------------------------------------------------------------  Cardiac Enzymes Recent Labs  Lab 08/15/17 1411  TROPONINI 0.04*   ------------------------------------------------------------------------------------------------------------------  RADIOLOGY:  No results found.   ASSESSMENT AND PLAN:   Patient is 77 year old white female with COPD chronic respiratory failure with sepsis  * Septic shock due to UTI On  IV abx Cx pending  * Diabetes type 2 placed on sliding scale insulin  * COPD continue oxygen and inhalers as doing at home  * Hyperlipidemia continue home regimen  Critically ill  All the records are reviewed and case discussed with Care Management/Social Worker Management  plans discussed with the patient, family and they are in agreement.  CODE STATUS: FULL CODE  DVT Prophylaxis: SCDs  TOTAL TIME TAKING CARE OF THIS PATIENT: 35 minutes.   POSSIBLE D/C IN 2-3 DAYS, DEPENDING ON CLINICAL CONDITION.  Orie FishermanSrikar R Daijon Wenke M.D on 08/17/2017 at 11:07 PM  Between 7am to 6pm - Pager - [phone]  After 6pm go to [email or web address] - password [email or web address] [facility]  [facility] [place] [facility]  Office  [phone]  CC: Primary care physician; Mickey Farberhies, David, MD  Note: This dictation was prepared with Dragon dictation along with smaller phrase technology. Any transcriptional errors that result from this process are unintentional.

## 2017-08-17 NOTE — Progress Notes (Signed)
Pt resting comfortably throughout night, denying pain. Titrated off of Levo, remains off. Will continue to monitor.

## 2017-08-17 NOTE — Progress Notes (Signed)
Patient with Afib with RVR Will give 10 mg cardizem IV x1 Restart Lopressor ER 100 mg oral    Lucie LeatherKurian David Asherah Lavoy, M.D.  [facility]  Medical Director [facility] Medical Director [facility] Cardio-Pulmonary Department

## 2017-08-18 LAB — BASIC METABOLIC PANEL
Anion gap: 8 (ref 5–15)
BUN: 27 mg/dL — AB (ref 6–20)
CO2: 21 mmol/L — AB (ref 22–32)
CREATININE: 1.31 mg/dL — AB (ref 0.44–1.00)
Calcium: 5.6 mg/dL — CL (ref 8.9–10.3)
Chloride: 109 mmol/L (ref 101–111)
GFR calc Af Amer: 45 mL/min — ABNORMAL LOW (ref >60)
GFR calc non Af Amer: 38 mL/min — ABNORMAL LOW (ref >60)
Glucose, Bld: 89 mg/dL (ref 65–99)
Potassium: 4.1 mmol/L (ref 3.5–5.1)
SODIUM: 138 mmol/L (ref 135–145)

## 2017-08-18 LAB — CBC
HCT: 33.1 % — ABNORMAL LOW (ref 35.0–47.0)
Hemoglobin: 10.6 g/dL — ABNORMAL LOW (ref 12.0–16.0)
MCH: 31.1 pg (ref 26.0–34.0)
MCHC: 32.1 g/dL (ref 32.0–36.0)
MCV: 97.1 fL (ref 80.0–100.0)
PLATELETS: 238 10*3/uL (ref 150–440)
RBC: 3.41 MIL/uL — ABNORMAL LOW (ref 3.80–5.20)
RDW: 19.2 % — AB (ref 11.5–14.5)
WBC: 14.7 10*3/uL — AB (ref 3.6–11.0)

## 2017-08-18 LAB — GLUCOSE, CAPILLARY
Glucose-Capillary: 84 mg/dL (ref 65–99)
Glucose-Capillary: 136 mg/dL — ABNORMAL HIGH (ref 65–99)
Glucose-Capillary: 110 mg/dL — ABNORMAL HIGH (ref 65–99)
Glucose-Capillary: 98 mg/dL (ref 65–99)

## 2017-08-18 MED ORDER — SODIUM CHLORIDE 0.9 % IV BOLUS
500.0000 mL | Freq: Once | INTRAVENOUS | Status: AC
  Administered 2017-08-18: 500 mL via INTRAVENOUS

## 2017-08-18 MED ORDER — OXYCODONE HCL 5 MG PO TABS
5.0000 mg | ORAL_TABLET | Freq: Four times a day (QID) | ORAL | Status: DC | PRN
  Administered 2017-08-18 – 2017-08-21 (×6): 5 mg via ORAL
  Filled 2017-08-18 (×6): qty 1

## 2017-08-18 MED ORDER — HYDROCORTISONE ACETATE 25 MG RE SUPP: 25.0000 mg | Freq: Two times a day (BID) | RECTAL | Status: DC

## 2017-08-18 MED ORDER — CEFAZOLIN SODIUM-DEXTROSE 1-4 GM/50ML-% IV SOLN
1.0000 g | Freq: Two times a day (BID) | INTRAVENOUS | Status: DC
Start: 2017-08-18 — End: 2017-08-20
  Administered 2017-08-18 – 2017-08-20 (×4): 1 g via INTRAVENOUS
  Filled 2017-08-18 (×5): qty 50

## 2017-08-18 MED ORDER — NICOTINE 14 MG/24HR TD PT24
14.0000 mg | MEDICATED_PATCH | Freq: Every day | TRANSDERMAL | Status: DC
Start: 2017-08-18 — End: 2017-08-26
  Administered 2017-08-18 – 2017-08-26 (×9): 14 mg via TRANSDERMAL
  Filled 2017-08-18 (×9): qty 1

## 2017-08-18 MED ORDER — HYDROCORTISONE ACETATE 25 MG RE SUPP
25.0000 mg | Freq: Two times a day (BID) | RECTAL | Status: DC
  Administered 2017-08-18: 25 mg via RECTAL
  Filled 2017-08-18 (×2): qty 1

## 2017-08-18 MED ORDER — POLYETHYLENE GLYCOL 3350 17 G PO PACK
17.0000 g | PACK | Freq: Every day | ORAL | Status: DC | PRN
Start: 2017-08-18 — End: 2017-08-24
  Administered 2017-08-18 – 2017-08-23 (×2): 17 g via ORAL
  Filled 2017-08-18 (×2): qty 1

## 2017-08-18 NOTE — Progress Notes (Addendum)
Report given to Federal WayAshly, Charity fundraiserN. Patient transferred to room 247 on telemetry. CCMD notified. Family updated on patient's new room. Patient was transferred in chair, and was unable to tolerate d/t rectum pain reported from patient, transferred back to bed. Patient already medicated for pain and given anusol suppository. Kathie RhodesAshly, RN updated. Trudee KusterBrandi R Mansfield

## 2017-08-18 NOTE — Progress Notes (Signed)
PT Hold Note  Patient Details Name: Brenda Glenn MRN: [id number] DOB: 11-Jul-1940   Hold Treatment:    Reason Eval/Treat Not Completed: Medical issues which prohibited therapy. Order received and chart reviewed. Pt with very low calcium and has been hypotensive. MD recently paged by RN due to concerns of low BP, low SaO2 and stomach/hemorrhod pain. Pt currently not appropriate for PT evaluation. Will attempt on later date/time as appropriate.  Sharalyn InkJason D Brenda Glenn PT, DPT   Brenda Glenn,Brenda Glenn 08/18/2017, 2:09 PM

## 2017-08-18 NOTE — Progress Notes (Signed)
MD paged due to concern of low BP 96/55, MAP 69, O2 93% 3L [place], HR 67, temp of 97.6, pt c/o pain d/t hemorrhoids, and stomach pain. Orders placed for decrease PRN oxycodone dosage, and notify MD of systolic BP < 90.

## 2017-08-19 LAB — GLUCOSE, CAPILLARY
GLUCOSE-CAPILLARY: 83 mg/dL (ref 65–99)
GLUCOSE-CAPILLARY: 90 mg/dL (ref 65–99)
Glucose-Capillary: 79 mg/dL (ref 65–99)
Glucose-Capillary: 83 mg/dL (ref 65–99)

## 2017-08-19 LAB — CULTURE, BLOOD (SINGLE): Special Requests: ADEQUATE

## 2017-08-19 MED ORDER — SODIUM CHLORIDE 0.9 % IV SOLN
2.0000 g | Freq: Once | INTRAVENOUS | Status: AC
  Administered 2017-08-19: 2 g via INTRAVENOUS
  Filled 2017-08-19: qty 20

## 2017-08-19 MED ORDER — OXYCODONE HCL 5 MG PO TABS
10.0000 mg | ORAL_TABLET | Freq: Four times a day (QID) | ORAL | Status: DC | PRN
  Administered 2017-08-19 – 2017-08-21 (×5): 10 mg via ORAL
  Filled 2017-08-19 (×5): qty 2

## 2017-08-19 NOTE — Progress Notes (Signed)
Pharmacy Electrolyte Monitoring Consult:  Pharmacy consulted to assist in monitoring and replacing electrolytes in this 77 y.o. female admitted on 08/15/2017 with Urinary Retention and Multiple Complaints   Labs:  Sodium (mmol/L)  Date Value  08/18/2017 138  04/03/2013 132 (L)   Potassium (mmol/L)  Date Value  08/18/2017 4.1  04/03/2013 3.3 (L)   Magnesium (mg/dL)  Date Value  16/10/960406/16/2019 2.2   Calcium (mg/dL)  Date Value  54/09/811906/19/2019 5.6 (LL)   Calcium, Total (mg/dL)  Date Value  14/78/295602/04/2013 8.6   Albumin (g/dL)  Date Value  21/30/865706/18/2019 1.9 (L)  02/20/2013 3.6   CCa: 7.3 mg/dL  Assessment/Plan: Calcium gluconate 2 g iv once per MD. Will f/u AM labs.   Luisa HartChristy, Nathaneil Feagans D 08/19/2017 11:52 AM

## 2017-08-19 NOTE — Progress Notes (Signed)
PT Cancellation Note  Patient Details Name: Brenda Glenn MRN: [id number] DOB: [birth year]   Cancelled Treatment:    Reason Eval/Treat Not Completed: Medical issues which prohibited therapy.  Pt was still unresolved for med issues of yesterday and will ck with her later today to see how she is progressing.   Ivar DrapeRuth E Kareena Arrambide 08/19/2017, 8:59 AM   Samul Dadauth Ettore Trebilcock, PT MS Acute Rehab Dept. Number: [facility] [phone] and [facility] [phone]

## 2017-08-19 NOTE — Plan of Care (Addendum)
Rounded with MD, notified Dr. Amado CoeGouru of patients constant pain, will increase oxy. See orders.  Problem: Pain Managment: Goal: General experience of comfort will improve Outcome: Progressing   Problem: Safety: Goal: Ability to remain free from injury will improve Outcome: Progressing   Problem: Skin Integrity: Goal: Risk for impaired skin integrity will decrease Outcome: Progressing

## 2017-08-19 NOTE — Care Management Note (Signed)
Case Management Note  Patient Details  Name: Brenda Glenn MRN: [id number] Date of Birth: 28-Apr-1940  Subjective/Objective:    Admitted to [facility] with the diagnosis of sepsis. Lives with husband, [phone]), Daughter is [name],  Dr. Harrington Challengerhies is listed as primary care physician,     Planning on getting another primary care. [facility] [place]. [facility]. Home health with another company in the past, doesn't remember name. Skilled nursing per [facility]. Home oxygen per [facility] for many years. Uses 2 liters per nasal cannula continuous. Takes care of all basic activities of daily living herself, drives. Last fall was a month ago. Decreased appetite.            Action/Plan: Will continue to follow for plans   Expected Discharge Date:                  Expected Discharge Plan:     In-House Referral:     Discharge planning Services     Post Acute Care Choice:    Choice offered to:     DME Arranged:    DME Agency:     HH Arranged:    HH Agency:     Status of Service:     If discussed at [employer] Length of Stay Meetings, dates discussed:    Additional Comments:  Gwenette GreetBrenda S Jmya Uliano, RN  MSN CCM Care Management [phone] 08/19/2017, 3:03 PM

## 2017-08-19 NOTE — Evaluation (Signed)
Physical Therapy Evaluation Patient Details Name: Brenda Glenn MRN: [id number] DOB: 05/11/1940 Today's Date: 08/19/2017   History of Present Illness  77 yo female was admitted for sepsis of UTI with urinary retention, low Ca+, hypotension and had recent fall with concussion.  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  Planning to work on strength and balance with RW for support as pt can tolerate. Family in agreement of plan and are going to support the expectations for rehab stay if possible to get permission.    Follow Up Recommendations SNF    Equipment Recommendations  None recommended by PT    Recommendations for Other Services       Precautions / Restrictions Precautions Precautions: Fall(telemetry) Restrictions Weight Bearing Restrictions: No      Mobility  Bed Mobility Overal bed mobility: Needs Assistance Bed Mobility: Supine to Sit;Sit to Supine     Supine to sit: Mod assist Sit to supine: Mod assist   General bed mobility comments: rolls with min to mod assist, support trunk to sit and legs return to bed  Transfers Overall transfer level: Needs assistance Equipment used: 1 person hand held assist Transfers: Sit to/from Stand Sit to Stand: Mod assist         General transfer comment: attempted walker unsuccessfully but can stand with PT assisting mod assist   Ambulation/Gait             General Gait Details: unable to take steps  Stairs            Wheelchair Mobility    Modified Rankin (Stroke Patients Only)       Balance Overall balance assessment: Needs assistance Sitting-balance support: Feet supported;Bilateral upper extremity  supported Sitting balance-Leahy Scale: Poor   Postural control: Posterior lean Standing balance support: Bilateral upper extremity supported;During functional activity Standing balance-Leahy Scale: Poor                               Pertinent Vitals/Pain Pain Assessment: No/denies pain    Home Living Family/patient expects to be discharged to:: Skilled nursing facility Living Arrangements: Spouse/significant other Available Help at Discharge: Family Type of Home: House Home Access: Ramped entrance     Home Layout: One level Home Equipment: Cane - single point;Walker - 2 wheels      Prior Function Level of Independence: Independent with assistive device(s)         Comments: reports she uses SPC for all mobility, not very active, short distances at baseline     Hand Dominance   Dominant Hand: Right    Extremity/Trunk Assessment   Upper Extremity Assessment Upper Extremity Assessment: Generalized weakness    Lower Extremity Assessment Lower Extremity Assessment: Generalized weakness    Cervical / Trunk Assessment Cervical / Trunk Assessment: Kyphotic  Communication   Communication: No difficulties  Cognition Arousal/Alertness: Awake/alert Behavior During Therapy: Flat affect Overall Cognitive Status: Within Functional Limits for tasks assessed                                 General Comments: anxious about being able to work  on standing      General Comments      Exercises     Assessment/Plan    PT Assessment Patient needs continued PT services  PT Problem List Decreased strength;Decreased range of motion;Decreased activity tolerance;Decreased balance;Decreased mobility;Decreased coordination;Decreased knowledge of use of DME;Decreased safety awareness;Cardiopulmonary status limiting activity       PT Treatment Interventions DME instruction;Gait training;Functional mobility training;Therapeutic activities;Therapeutic  exercise;Balance training;Neuromuscular re-education;Patient/family education    PT Goals (Current goals can be found in the Care Plan section)  Acute Rehab PT Goals Patient Stated Goal: to get stronger and get home PT Goal Formulation: With patient/family Time For Goal Achievement: 09/02/17 Potential to Achieve Goals: Good    Frequency Min 2X/week   Barriers to discharge Decreased caregiver support(home with husband who cannot lift her)      Co-evaluation               AM-PAC PT "6 Clicks" Daily Activity  Outcome Measure Difficulty turning over in bed (including adjusting bedclothes, sheets and blankets)?: Unable Difficulty moving from lying on back to sitting on the side of the bed? : Unable Difficulty sitting down on and standing up from a chair with arms (e.g., wheelchair, bedside commode, etc,.)?: Unable Help needed moving to and from a bed to chair (including a wheelchair)?: A Lot Help needed walking in hospital room?: Total Help needed climbing 3-5 steps with a railing? : Total 6 Click Score: 7    End of Session Equipment Utilized During Treatment: Gait belt Activity Tolerance: Patient limited by fatigue;Treatment limited secondary to agitation Patient left: in bed;with call bell/phone within reach;with bed alarm set;with family/visitor present Nurse Communication: Mobility status PT Visit Diagnosis: Unsteadiness on feet (R26.81);Muscle weakness (generalized) (M62.81);Difficulty in walking, not elsewhere classified (R26.2);Adult, failure to thrive (R62.7)    Time: 1610-9604 PT Time Calculation (min) (ACUTE ONLY): 21 min   Charges:   PT Evaluation $PT Eval Moderate Complexity: 1 Mod     PT G Codes:        Ivar Drape 2017-09-05, 10:10 PM   Samul Dada, PT MS Acute Rehab Dept. Number: [facility] [id number] and [facility] [phone]

## 2017-08-19 NOTE — Progress Notes (Signed)
[facility] - [place] at [facility]   PATIENT NAME: Brenda LewJamie Glenn    MR#:  [id number]  DATE OF BIRTH:  1940/12/04  SUBJECTIVE:  CHIEF COMPLAINT:   Chief Complaint  Patient presents with  . Urinary Retention  . Multiple Complaints   Back pain still present but better. chronic  REVIEW OF SYSTEMS:    Review of Systems  Constitutional: Positive for malaise/fatigue. Negative for chills and fever.  HENT: Negative for sore throat.   Eyes: Negative for blurred vision, double vision and pain.  Respiratory: Negative for cough, hemoptysis, shortness of breath and wheezing.   Cardiovascular: Negative for chest pain, palpitations, orthopnea and leg swelling.  Gastrointestinal: Negative for abdominal pain, constipation, diarrhea, heartburn, nausea and vomiting.  Genitourinary: Negative for dysuria and hematuria.  Musculoskeletal: Positive for back pain. Negative for joint pain.  Skin: Negative for rash.  Neurological: Negative for sensory change, speech change, focal weakness and headaches.  Endo/Heme/Allergies: Does not bruise/bleed easily.  Psychiatric/Behavioral: Negative for depression. The patient is not nervous/anxious.     DRUG ALLERGIES:   Allergies  Allergen Reactions  . Tequin [Gatifloxacin] Shortness Of Breath  . Bupropion     Other reaction(s): Other (See Comments) shaking  . Keflex [Cephalexin] Other (See Comments)    Unknown  . Lisinopril Other (See Comments)    Unknown  . Varenicline Nausea Only and Nausea And Vomiting    VITALS:  Blood pressure 115/73, pulse 94, temperature 98 F (36.7 C), temperature source Oral, resp. rate 18, height 4\' 11"  (1.499 m), weight 83.9 kg (185 lb), SpO2 95 %.  PHYSICAL EXAMINATION:   Physical Exam  GENERAL:  77 y.o.-year-old patient lying in the bed. obese  EYES: Pupils equal, round, reactive to light and accommodation. No scleral icterus. Extraocular muscles intact.  HEENT: Head atraumatic, normocephalic.  Oropharynx and nasopharynx clear.  NECK:  Supple, no jugular venous distention. No thyroid enlargement, no tenderness.  LUNGS: Normal breath sounds bilaterally, no wheezing, rales, rhonchi. No use of accessory muscles of respiration.  CARDIOVASCULAR: S1, S2 normal. No murmurs, rubs, or gallops.  ABDOMEN: Soft, nontender, nondistended. Bowel sounds present. No organomegaly or mass.  EXTREMITIES: No cyanosis, clubbing or edema b/l.    NEUROLOGIC: Moves all 4 extremities PSYCHIATRIC: The patient is alert and awake SKIN: No obvious rash, lesion, or ulcer.   LABORATORY PANEL:   CBC Recent Labs  Lab 08/18/17 0625  WBC 14.7*  HGB 10.6*  HCT 33.1*  PLT 238   ------------------------------------------------------------------------------------------------------------------ Chemistries  Recent Labs  Lab 08/15/17 1411  08/17/17 0350 08/18/17 0625  NA 132*   < > 138 138  K 4.8   < > 4.0 4.1  CL 98*   < > 108 109  CO2 25   < > 22 21*  GLUCOSE 130*   < > 99 89  BUN 55*   < > 34* 27*  CREATININE 1.97*   < > 1.32* 1.31*  CALCIUM 6.2*   < > 5.6* 5.6*  MG 2.2  --   --   --   AST 44*  --  41  --   ALT 18  --  29  --   ALKPHOS 104  --  292*  --   BILITOT 0.7  --  0.7  --    < > = values in this interval not displayed.   ------------------------------------------------------------------------------------------------------------------  Cardiac Enzymes Recent Labs  Lab 08/15/17 1411  TROPONINI 0.04*   ------------------------------------------------------------------------------------------------------------------  RADIOLOGY:  No results found.  ASSESSMENT AND PLAN:   Patient is 77 year old white female with COPD chronic respiratory failure with sepsis  * Septic shock due to UTI On IV abx. Ucx with proteus. Changed from meropenem to IV ancef WBC trending down Shock has resolved  * Diabetes type 2 placed on sliding scale insulin  * COPD continue oxygen and inhalers as  doing at home  * Hyperlipidemia continue home regimen  D/C in 1-2 days  All the records are reviewed and case discussed with Care Management/Social Worker Management plans discussed with the patient, family and they are in agreement.  CODE STATUS: FULL CODE  DVT Prophylaxis: SCDs  TOTAL TIME TAKING CARE OF THIS PATIENT: 35 minutes.   POSSIBLE D/C IN 2-3 DAYS, DEPENDING ON CLINICAL CONDITION.  Molinda Bailiff Luverta Korte M.D on 08/19/2017 at 2:31 PM  Between 7am to 6pm - Pager - [phone]  After 6pm go to [email or web address] - password [email or web address] [facility]  [facility] [place] [facility]  Office  [phone]  CC: Primary care physician; Mickey Farber, MD  Note: This dictation was prepared with Dragon dictation along with smaller phrase technology. Any transcriptional errors that result from this process are unintentional.

## 2017-08-19 NOTE — Care Management Important Message (Signed)
Important Message  Patient Details  Name: Brenda Glenn MRN: [id number] Date of Birth: [birth year]   Medicare Important Message Given:  Yes    Gwenette GreetBrenda S Rateel Beldin, RN 08/19/2017, 2:02 PM

## 2017-08-19 NOTE — Progress Notes (Addendum)
[facility] - [place] at [facility]   PATIENT NAME: Brenda LewJamie Glenn    MR#:  [id number]  DATE OF BIRTH:  [birth year]  SUBJECTIVE:  CHIEF COMPLAINT:   Chief Complaint  Patient presents with  . Urinary Retention  . Multiple Complaints   Pain from external hemorrhoids, still has to work with the physical therapist Daughter Brenda MaxwellCheryl at  bedside REVIEW OF SYSTEMS:    Review of Systems  Constitutional: Positive for malaise/fatigue. Negative for chills and fever.  HENT: Negative for sore throat.   Eyes: Negative for blurred vision, double vision and pain.  Respiratory: Negative for cough, hemoptysis, shortness of breath and wheezing.   Cardiovascular: Negative for chest pain, palpitations, orthopnea and leg swelling.  Gastrointestinal: Negative for abdominal pain, constipation, diarrhea, heartburn, nausea and vomiting.  Genitourinary: Negative for dysuria and hematuria.       Pain from ext hemorrhoids  Musculoskeletal: Positive for back pain. Negative for joint pain.  Skin: Negative for rash.  Neurological: Negative for sensory change, speech change, focal weakness and headaches.  Endo/Heme/Allergies: Does not bruise/bleed easily.  Psychiatric/Behavioral: Negative for depression. The patient is not nervous/anxious.   Pain, hemorrhoids, muscle cramps  DRUG ALLERGIES:   Allergies  Allergen Reactions  . Tequin [Gatifloxacin] Shortness Of Breath  . Bupropion     Other reaction(s): Other (See Comments) shaking  . Keflex [Cephalexin] Other (See Comments)    Unknown  . Lisinopril Other (See Comments)    Unknown  . Varenicline Nausea Only and Nausea And Vomiting    VITALS:  Blood pressure 115/73, pulse 94, temperature 98 F (36.7 C), temperature source Oral, resp. rate 18, height 4\' 11"  (1.499 m), weight 83.9 kg (185 lb), SpO2 95 %.  PHYSICAL EXAMINATION:   Physical Exam  GENERAL:  77 y.o.-year-old patient lying in the bed. obese  EYES: Pupils equal, round, reactive  to light and accommodation. No scleral icterus. Extraocular muscles intact.  HEENT: Head atraumatic, normocephalic. Oropharynx and nasopharynx clear.  NECK:  Supple, no jugular venous distention. No thyroid enlargement, no tenderness.  LUNGS: Normal breath sounds bilaterally, no wheezing, rales, rhonchi. No use of accessory muscles of respiration.  CARDIOVASCULAR: S1, S2 normal. No murmurs, rubs, or gallops.  ABDOMEN: Soft, nontender, nondistended. Bowel sounds present.  External hemorrhoids EXTREMITIES: No cyanosis, clubbing or edema b/l.    NEUROLOGIC: Moves all 4 extremities PSYCHIATRIC: The patient is alert and awake SKIN: No obvious rash, lesion, or ulcer.   LABORATORY PANEL:   CBC Recent Labs  Lab 08/18/17 0625  WBC 14.7*  HGB 10.6*  HCT 33.1*  PLT 238   ------------------------------------------------------------------------------------------------------------------ Chemistries  Recent Labs  Lab 08/15/17 1411  08/17/17 0350 08/18/17 0625  NA 132*   < > 138 138  K 4.8   < > 4.0 4.1  CL 98*   < > 108 109  CO2 25   < > 22 21*  GLUCOSE 130*   < > 99 89  BUN 55*   < > 34* 27*  CREATININE 1.97*   < > 1.32* 1.31*  CALCIUM 6.2*   < > 5.6* 5.6*  MG 2.2  --   --   --   AST 44*  --  41  --   ALT 18  --  29  --   ALKPHOS 104  --  292*  --   BILITOT 0.7  --  0.7  --    < > = values in this interval not displayed.   ------------------------------------------------------------------------------------------------------------------  Cardiac Enzymes Recent Labs  Lab 08/15/17 1411  TROPONINI 0.04*   ------------------------------------------------------------------------------------------------------------------  RADIOLOGY:  No results found.   ASSESSMENT AND PLAN:   Patient is 77 year old white female with COPD chronic respiratory failure with sepsis  * Septic shock due to UTI Septic shock resolved Urine culture with greater than 100,000 colonies of Proteus  mirabilis sensitive to p.o. ciprofloxacin, cefazolin, and ceftriaxone change antibiotics to p.o. Ciprofloxacin Blood cultures with coagulase-negative Staphylococcus, no antibiotic treatments are needed at this time Changed from meropenem to IV ancef , changed to p.o. ciprofloxacin 08/19/2017 WBC trending down  *Hypocalcemia with muscle cramps  calcium gluconate 2 g IV Check ionized calcium  *Pain from the external hemorrhoids -[facility] HC, gen surgery consult  *Chronic kidney disease seems to be at  baseline, creatinine at 1.31   * Diabetes type 2 placed on sliding scale insulin  * COPD continue oxygen and inhalers as doing at home  * Hyperlipidemia continue home regimen  *Disposition: Pending PT consult, patient is not sure if she wants to go to rehab center    All the records are reviewed and case discussed with Care Management/Social Worker Management plans discussed with the patient, daughter at bedside and they are in agreement.  CODE STATUS: FULL CODE  DVT Prophylaxis: SCDs  TOTAL TIME TAKING CARE OF THIS PATIENT: 35 minutes.   POSSIBLE D/C IN 1 DAYS, DEPENDING ON CLINICAL CONDITION.  Ramonita Lab M.D on 08/19/2017 at 3:28 PM  Between 7am to 6pm - Pager - [phone]  After 6pm go to [email or web address] - password [email or web address] [facility]  [facility] [place] [facility]  Office  [phone]  CC: Primary care physician; Brenda Farber, MD  Note: This dictation was prepared with Dragon dictation along with smaller phrase technology. Any transcriptional errors that result from this process are unintentional.

## 2017-08-20 ENCOUNTER — Encounter
Admission: EM | Disposition: A | Discharge status: Skilled Nursing Facility | Payer: Self-pay | Source: Home / Self Care | Attending: Internal Medicine

## 2017-08-20 ENCOUNTER — Inpatient Hospital Stay: Payer: Medicare Other | Admitting: Registered Nurse

## 2017-08-20 ENCOUNTER — Encounter: Payer: Self-pay | Admitting: Anesthesiology

## 2017-08-20 DIAGNOSIS — K610 Anal abscess: Secondary | ICD-10-CM

## 2017-08-20 HISTORY — PX: INCISION AND DRAINAGE PERIRECTAL ABSCESS: SHX1804

## 2017-08-20 HISTORY — PX: RECTAL EXAM UNDER ANESTHESIA: SHX6399

## 2017-08-20 LAB — BASIC METABOLIC PANEL
Anion gap: 9 (ref 5–15)
BUN: 16 mg/dL (ref 6–20)
CHLORIDE: 110 mmol/L (ref 101–111)
CO2: 21 mmol/L — ABNORMAL LOW (ref 22–32)
CREATININE: 0.95 mg/dL (ref 0.44–1.00)
Calcium: 5.9 mg/dL — CL (ref 8.9–10.3)
GFR calc Af Amer: >60 mL/min (ref >60)
GFR, EST NON AFRICAN AMERICAN: 57 mL/min — AB (ref >60)
Glucose, Bld: 72 mg/dL (ref 65–99)
POTASSIUM: 4.6 mmol/L (ref 3.5–5.1)
SODIUM: 140 mmol/L (ref 135–145)

## 2017-08-20 LAB — GLUCOSE, CAPILLARY
GLUCOSE-CAPILLARY: 113 mg/dL — AB (ref 65–99)
GLUCOSE-CAPILLARY: 68 mg/dL (ref 65–99)
Glucose-Capillary: 67 mg/dL (ref 65–99)
Glucose-Capillary: 88 mg/dL (ref 65–99)
Glucose-Capillary: 78 mg/dL (ref 65–99)
Glucose-Capillary: 85 mg/dL (ref 65–99)
Glucose-Capillary: 95 mg/dL (ref 65–99)

## 2017-08-20 LAB — MAGNESIUM: MAGNESIUM: 1.9 mg/dL (ref 1.7–2.4)

## 2017-08-20 SURGERY — EXAM UNDER ANESTHESIA, RECTUM
Anesthesia: General | Wound class: Dirty or Infected

## 2017-08-20 MED ORDER — ONDANSETRON HCL 4 MG/2ML IJ SOLN
INTRAMUSCULAR | Status: DC | PRN
  Administered 2017-08-20: 4 mg via INTRAVENOUS

## 2017-08-20 MED ORDER — BUPIVACAINE HCL (PF) 0.5 % IJ SOLN
INTRAMUSCULAR | Status: AC
  Filled 2017-08-20: qty 10

## 2017-08-20 MED ORDER — PROPOFOL 10 MG/ML IV BOLUS
INTRAVENOUS | Status: AC
  Filled 2017-08-20: qty 20

## 2017-08-20 MED ORDER — HYDROMORPHONE HCL 1 MG/ML IJ SOLN
INTRAMUSCULAR | Status: AC
  Administered 2017-08-20: 0.5 mg via INTRAVENOUS
  Filled 2017-08-20: qty 1

## 2017-08-20 MED ORDER — FENTANYL CITRATE (PF) 100 MCG/2ML IJ SOLN
INTRAMUSCULAR | Status: AC
  Filled 2017-08-20: qty 2

## 2017-08-20 MED ORDER — BUPIVACAINE-EPINEPHRINE (PF) 0.25% -1:200000 IJ SOLN
INTRAMUSCULAR | Status: AC
Start: 2017-08-20 — End: ?
  Filled 2017-08-20: qty 30

## 2017-08-20 MED ORDER — PHENYLEPHRINE HCL 10 MG/ML IJ SOLN
INTRAMUSCULAR | Status: DC | PRN
  Administered 2017-08-20: 100 ug via INTRAVENOUS

## 2017-08-20 MED ORDER — ONDANSETRON HCL 4 MG/2ML IJ SOLN: 4.0000 mg | Freq: Once | INTRAMUSCULAR | Status: DC | PRN

## 2017-08-20 MED ORDER — PIPERACILLIN-TAZOBACTAM 3.375 G IVPB
3.3750 g | Freq: Three times a day (TID) | INTRAVENOUS | Status: DC
  Administered 2017-08-20 – 2017-08-23 (×9): 3.375 g via INTRAVENOUS
  Filled 2017-08-20 (×11): qty 50

## 2017-08-20 MED ORDER — PROPOFOL 10 MG/ML IV BOLUS
INTRAVENOUS | Status: DC | PRN
  Administered 2017-08-20: 20 mg via INTRAVENOUS
  Administered 2017-08-20: 140 mg via INTRAVENOUS
  Administered 2017-08-20: 30 mg via INTRAVENOUS

## 2017-08-20 MED ORDER — FENTANYL CITRATE (PF) 100 MCG/2ML IJ SOLN
INTRAMUSCULAR | Status: AC
  Administered 2017-08-20: 50 ug via INTRAVENOUS
  Filled 2017-08-20: qty 2

## 2017-08-20 MED ORDER — FENTANYL CITRATE (PF) 100 MCG/2ML IJ SOLN
25.0000 ug | INTRAMUSCULAR | Status: DC | PRN
  Administered 2017-08-20 (×2): 50 ug via INTRAVENOUS

## 2017-08-20 MED ORDER — LIDOCAINE HCL (CARDIAC) PF 100 MG/5ML IV SOSY
PREFILLED_SYRINGE | INTRAVENOUS | Status: DC | PRN
  Administered 2017-08-20: 60 mg via INTRAVENOUS

## 2017-08-20 MED ORDER — SODIUM CHLORIDE 0.9 % IV SOLN
2.0000 g | Freq: Two times a day (BID) | INTRAVENOUS | Status: AC
  Administered 2017-08-20 (×2): 2 g via INTRAVENOUS
  Filled 2017-08-20 (×2): qty 20

## 2017-08-20 MED ORDER — LACTATED RINGERS IV SOLN
INTRAVENOUS | Status: DC | PRN
  Administered 2017-08-20: 16:00:00 via INTRAVENOUS

## 2017-08-20 MED ORDER — FENTANYL CITRATE (PF) 100 MCG/2ML IJ SOLN
INTRAMUSCULAR | Status: AC
Start: 2017-08-20 — End: ?
  Filled 2017-08-20: qty 2

## 2017-08-20 MED ORDER — HYDROMORPHONE HCL 1 MG/ML IJ SOLN
0.2500 mg | INTRAMUSCULAR | Status: DC | PRN
  Administered 2017-08-20 (×4): 0.5 mg via INTRAVENOUS

## 2017-08-20 MED ORDER — DEXMEDETOMIDINE HCL IN NACL 200 MCG/50ML IV SOLN
INTRAVENOUS | Status: DC | PRN
  Administered 2017-08-20 (×3): 12 ug via INTRAVENOUS

## 2017-08-20 MED ORDER — FENTANYL CITRATE (PF) 100 MCG/2ML IJ SOLN
INTRAMUSCULAR | Status: DC | PRN
  Administered 2017-08-20 (×2): 50 ug via INTRAVENOUS
  Administered 2017-08-20: 25 ug via INTRAVENOUS
  Administered 2017-08-20 (×2): 50 ug via INTRAVENOUS
  Administered 2017-08-20: 25 ug via INTRAVENOUS
  Administered 2017-08-20 (×2): 50 ug via INTRAVENOUS
  Administered 2017-08-20 (×2): 25 ug via INTRAVENOUS

## 2017-08-20 MED ORDER — DEXTROSE 50 % IV SOLN
25.0000 mL | Freq: Once | INTRAVENOUS | Status: AC
  Administered 2017-08-20: 25 mL via INTRAVENOUS
  Filled 2017-08-20: qty 50

## 2017-08-20 MED ORDER — MIDAZOLAM HCL 2 MG/2ML IJ SOLN
INTRAMUSCULAR | Status: AC
  Filled 2017-08-20: qty 2

## 2017-08-20 SURGICAL SUPPLY — 26 items
BLADE CLIPPER SURG (BLADE) ×3 IMPLANT
BLADE SURG 15 STRL LF DISP TIS (BLADE) ×1 IMPLANT
BLADE SURG 15 STRL SS (BLADE) ×2
BRUSH SCRUB EZ  4% CHG (MISCELLANEOUS) ×2
BRUSH SCRUB EZ 4% CHG (MISCELLANEOUS) ×1 IMPLANT
CANISTER SUCT 1200ML W/VALVE (MISCELLANEOUS) ×3 IMPLANT
DRAIN PENROSE 5/8X18 LTX STRL (WOUND CARE) ×3 IMPLANT
DRAPE LEGGINS SURG 28X43 STRL (DRAPES) ×3 IMPLANT
DRAPE UNDER BUTTOCK W/FLU (DRAPES) ×3 IMPLANT
ELECT CAUTERY BLADE 6.4 (BLADE) ×3 IMPLANT
ELECT REM PT RETURN 9FT ADLT (ELECTROSURGICAL) ×3
ELECTRODE REM PT RTRN 9FT ADLT (ELECTROSURGICAL) ×1 IMPLANT
GLOVE BIO SURGEON STRL SZ7 (GLOVE) ×3 IMPLANT
GOWN STRL REUS W/ TWL LRG LVL3 (GOWN DISPOSABLE) ×2 IMPLANT
GOWN STRL REUS W/TWL LRG LVL3 (GOWN DISPOSABLE) ×4
NEEDLE HYPO 22GX1.5 SAFETY (NEEDLE) ×3 IMPLANT
NS IRRIG 1000ML POUR BTL (IV SOLUTION) ×3 IMPLANT
PACK BASIN MINOR ARMC (MISCELLANEOUS) ×3 IMPLANT
PAD PREP 24X41 OB/GYN DISP (PERSONAL CARE ITEMS) ×3 IMPLANT
SOL PREP PVP 2OZ (MISCELLANEOUS) ×3
SOLUTION PREP PVP 2OZ (MISCELLANEOUS) ×1 IMPLANT
SPONGE LAP 18X18 RF (DISPOSABLE) ×3 IMPLANT
SURGILUBE 2OZ TUBE FLIPTOP (MISCELLANEOUS) ×3 IMPLANT
SUT SILK 2 0SH CR/8 30 (SUTURE) ×3 IMPLANT
SWAB DUAL CULTURE TRANS RED ST (MISCELLANEOUS) ×3 IMPLANT
SYR 20CC LL (SYRINGE) ×3 IMPLANT

## 2017-08-20 NOTE — Transfer of Care (Signed)
Immediate Anesthesia Transfer of Care Note  Patient: Brenda Glenn  Procedure(s) Performed: RECTAL EXAM UNDER ANESTHESIA (N/A ) IRRIGATION AND DEBRIDEMENT PERIRECTAL ABSCESS (N/A )  Patient Location: PACU  Anesthesia Type:General  Level of Consciousness: awake and alert   Airway & Oxygen Therapy: Patient Spontanous Breathing and Patient connected to face mask oxygen  Post-op Assessment: Report given to RN and Post -op Vital signs reviewed and stable  Post vital signs: Reviewed and stable  Last Vitals:  Vitals Value Taken Time  BP 130/73 08/20/2017  5:02 PM  Temp 36.3 C 08/20/2017  5:02 PM  Pulse 107 08/20/2017  5:02 PM  Resp 15 08/20/2017  5:02 PM  SpO2 93 % 08/20/2017  5:02 PM  Vitals shown include unvalidated device data.  Last Pain:  Vitals:   08/20/17 0928  TempSrc: Oral  PainSc:       Patients Stated Pain Goal: 1 (08/19/17 1555)  Complications: No apparent anesthesia complications

## 2017-08-20 NOTE — Progress Notes (Signed)
Pt transferred to OR, Zosyn running 12.85ml/hr.

## 2017-08-20 NOTE — Progress Notes (Signed)
[facility] - [place] at [facility]   PATIENT NAME: Doreatha LewJamie Dallman    MR#:  [id number]  DATE OF BIRTH:  03-14-1940  SUBJECTIVE:  CHIEF COMPLAINT:   Chief Complaint  Patient presents with  . Urinary Retention  . Multiple Complaints   Patient has terrible pain in the perianal area was seen by surgery Daughter Elnita Maxwellheryl at  bedside REVIEW OF SYSTEMS:    Review of Systems  Constitutional: Positive for malaise/fatigue. Negative for chills and fever.  HENT: Negative for sore throat.   Eyes: Negative for blurred vision, double vision and pain.  Respiratory: Negative for cough, hemoptysis, shortness of breath and wheezing.   Cardiovascular: Negative for chest pain, palpitations, orthopnea and leg swelling.  Gastrointestinal: Negative for abdominal pain, constipation, diarrhea, heartburn, nausea and vomiting.  Genitourinary: Negative for dysuria and hematuria.       Pain from perianal horseshoe-shaped abscess  Musculoskeletal: Positive for back pain. Negative for joint pain.  Skin: Negative for rash.  Neurological: Negative for sensory change, speech change, focal weakness and headaches.  Endo/Heme/Allergies: Does not bruise/bleed easily.  Psychiatric/Behavioral: Negative for depression. The patient is not nervous/anxious.   Pain, hemorrhoids, muscle cramps  DRUG ALLERGIES:   Allergies  Allergen Reactions  . Tequin [Gatifloxacin] Shortness Of Breath  . Bupropion     Other reaction(s): Other (See Comments) shaking  . Keflex [Cephalexin] Other (See Comments)    Unknown  . Lisinopril Other (See Comments)    Unknown  . Varenicline Nausea Only and Nausea And Vomiting    VITALS:  Blood pressure 114/61, pulse 95, temperature 97.9 F (36.6 C), temperature source Oral, resp. rate 18, height 4\' 11"  (1.499 m), weight 83.9 kg (185 lb), SpO2 97 %.  PHYSICAL EXAMINATION:   Physical Exam  GENERAL:  77 y.o.-year-old patient lying in the bed. obese  EYES: Pupils equal, round,  reactive to light and accommodation. No scleral icterus. Extraocular muscles intact.  HEENT: Head atraumatic, normocephalic. Oropharynx and nasopharynx clear.  NECK:  Supple, no jugular venous distention. No thyroid enlargement, no tenderness.  LUNGS: Normal breath sounds bilaterally, no wheezing, rales, rhonchi. No use of accessory muscles of respiration.  CARDIOVASCULAR: S1, S2 normal. No murmurs, rubs, or gallops.  ABDOMEN: Soft, nontender, nondistended. Bowel sounds present.  External hemorrhoids, perianal abscess tender to touch EXTREMITIES: No cyanosis, clubbing or edema b/l.    NEUROLOGIC: Moves all 4 extremities PSYCHIATRIC: The patient is alert and awake SKIN: No obvious rash, lesion, or ulcer.   LABORATORY PANEL:   CBC Recent Labs  Lab 08/18/17 0625  WBC 14.7*  HGB 10.6*  HCT 33.1*  PLT 238   ------------------------------------------------------------------------------------------------------------------ Chemistries  Recent Labs  Lab 08/17/17 0350  08/20/17 0453  NA 138   < > 140  K 4.0   < > 4.6  CL 108   < > 110  CO2 22   < > 21*  GLUCOSE 99   < > 72  BUN 34*   < > 16  CREATININE 1.32*   < > 0.95  CALCIUM 5.6*   < > 5.9*  MG  --   --  1.9  AST 41  --   --   ALT 29  --   --   ALKPHOS 292*  --   --   BILITOT 0.7  --   --    < > = values in this interval not displayed.   ------------------------------------------------------------------------------------------------------------------  Cardiac Enzymes Recent Labs  Lab 08/15/17 1411  TROPONINI 0.04*   ------------------------------------------------------------------------------------------------------------------  RADIOLOGY:  No results found.   ASSESSMENT AND PLAN:   Patient is 77 year old white female with COPD chronic respiratory failure with sepsis  * Septic shock due to UTI Septic shock resolved Urine culture with greater than 100,000 colonies of Proteus mirabilis sensitive to p.o.  ciprofloxacin, cefazolin, and ceftriaxone change antibiotics to p.o. Ciprofloxacin Blood cultures with coagulase-negative Staphylococcus, no antibiotic treatments are needed at this time Changed from meropenem to IV ancef , changed to p.o. ciprofloxacin 08/19/2017 WBC trending down  *Hypocalcemia with muscle cramps  calcium gluconate 2 g IV given twice Albumin at 1.9 and corrected calcium 7.6, will provide calcium supplements by mouth  *Perianal pain secondary to peri-and horseshoe abscess N.p.o., patient is started on IV Zosyn for IND today by surgery  appreciate surgery Dr.Pabone recommendations  *Chronic kidney disease seems to be at  baseline, creatinine at 0.95 today   * Diabetes type 2 placed on sliding scale insulin  * COPD continue oxygen and inhalers as doing at home  * Hyperlipidemia continue home regimen  *Disposition: PT consult - snf ;patient , husband and daughter prefers patient going to rehab center.   All the records are reviewed and case discussed with Care Management/Social Worker Management plans discussed with the patient, daughter at bedside and they are in agreement.  CODE STATUS: FULL CODE  DVT Prophylaxis: SCDs  TOTAL TIME TAKING CARE OF THIS PATIENT: 35 minutes.   POSSIBLE D/C IN 1 DAYS, DEPENDING ON CLINICAL CONDITION.  Ramonita Lab M.D on 08/20/2017 at 3:28 PM  Between 7am to 6pm - Pager - [phone]  After 6pm go to [email or web address] - password [email or web address] [facility]  [facility] [place] [facility]  Office  [phone]  CC: Primary care physician; Mickey Farber, MD  Note: This dictation was prepared with Dragon dictation along with smaller phrase technology. Any transcriptional errors that result from this process are unintentional.

## 2017-08-20 NOTE — [place] FL2 (Signed)
[place] MEDICAID FL2 LEVEL OF CARE SCREENING TOOL     IDENTIFICATION  Patient Name: Brenda Glenn Birthdate: 1940-07-23 Sex: female Admission Date (Current Location): 08/15/2017  [place] and [place] Number:  Chiropodist and Address:  [facility], [address] [place], [place] [address]      Provider Number: [id number]  Attending Physician Name and Address:  Ramonita Lab, MD  Relative Name and Phone Number:  Pam, Vanalstine [phone] or Gerringer,Cheryl Daughter   [phone] or McArthur,Carol Daughter   [phone]     Current Level of Care: Hospital Recommended Level of Care: Skilled Nursing Facility Prior Approval Number:    Date Approved/Denied:   PASRR Number: [id number]  Discharge Plan: SNF    Current Diagnoses: Patient Active Problem List   Diagnosis Date Noted  . Perianal abscess   . Sepsis (HCC) 08/15/2017  . Bilateral lower extremity edema 07/28/2017  . Lower extremity numbness 07/28/2017  . ARF (acute renal failure) (HCC) 06/20/2017  . 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 Lymphedema 01/06/2016  . Chronic venous insufficiency 01/06/2016  . Pain in limb 01/06/2016  . COPD (chronic obstructive pulmonary disease) (HCC) 01/06/2016  . Abnormality of gait 01/06/2016    Orientation RESPIRATION BLADDER Height & Weight     Self, Time, Situation, Place  O2(2L) Continent Weight: 185 lb (83.9 kg) Height:  4\' 11"  (149.9 cm)  BEHAVIORAL SYMPTOMS/MOOD NEUROLOGICAL BOWEL NUTRITION STATUS      Continent Diet(Carb Modified)  AMBULATORY STATUS COMMUNICATION OF NEEDS Skin   Limited Assist Verbally Normal                       Personal Care Assistance Level of Assistance  Bathing, Feeding, Dressing Bathing Assistance: Limited assistance Feeding assistance: Independent Dressing Assistance: Limited assistance     Functional Limitations Info  Sight, Hearing, Speech Sight Info: Adequate Hearing  Info: Adequate Speech Info: Adequate    SPECIAL CARE FACTORS FREQUENCY  PT (By licensed PT), OT (By licensed OT)     PT Frequency: 5x a week OT Frequency: 5x a week            Contractures Contractures Info: Not present    Additional Factors Info  Code Status, Allergies, Insulin Sliding Scale, Isolation Precautions Code Status Info: Full Code Allergies Info: TEQUIN GATIFLOXACIN, BUPROPION, KEFLEX CEPHALEXIN, LISINOPRIL, VARENICLINE    Insulin Sliding Scale Info: insulin aspart (novoLOG) injection 0-15 Units 3x a day with meals Isolation Precautions Info: 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 acetaminophen (TYLENOL) tablet 650 mg  650 mg Oral Q6H PRN Auburn Bilberry, MD   650 mg at 08/17/17 2146  . allopurinol (ZYLOPRIM) tablet 200 mg  200 mg Oral Daily Auburn Bilberry, MD   200 mg at 08/20/17 0924  . aspirin EC tablet 81 mg  81 mg Oral Daily Auburn Bilberry, MD   81 mg at 08/20/17 0924  . calcium gluconate 2 g in sodium chloride 0.9 % 100 mL IVPB  2 g Intravenous Q12H Ramonita Lab, MD   Stopped at 08/20/17 1142  . Chlorhexidine Gluconate Cloth 2 % PADS 6 each  6 each Topical Q0600 Merwyn Katos, MD   6 each at 08/18/17 0617  . diclofenac (FLECTOR) 1.3 % 1 patch  1 patch Transdermal Daily PRN  Auburn BilberryPatel, Shreyang, MD      . docusate sodium (COLACE) capsule 100 mg  100 mg Oral BID Merwyn KatosSimonds, David B, MD   100 mg at 08/20/17 [id number]  . enoxaparin (LOVENOX) injection 40 mg  40 mg Subcutaneous Q24H Merwyn KatosSimonds, David B, MD   40 mg at 08/19/17 2132  . ezetimibe (ZETIA) tablet 10 mg  10 mg Oral QHS Auburn BilberryPatel, Shreyang, MD   10 mg at 08/19/17 2132  . guaiFENesin (MUCINEX) 12 hr tablet 600 mg  600 mg Oral Q12H PRN Auburn BilberryPatel, Shreyang, MD      . insulin aspart (novoLOG) injection 0-15 Units  0-15 Units  Subcutaneous TID WC Merwyn KatosSimonds, David B, MD   2 Units at 08/18/17 1144  . insulin aspart (novoLOG) injection 0-5 Units  0-5 Units Subcutaneous QHS Merwyn KatosSimonds, David B, MD      . ipratropium-albuterol (DUONEB) 0.5-2.5 (3) MG/3ML nebulizer solution 3 mL  3 mL Nebulization Q6H PRN Merwyn KatosSimonds, David B, MD      . metoprolol succinate (TOPROL-XL) 24 hr tablet 100 mg  100 mg Oral Daily Erin FullingKasa, Kurian, MD   100 mg at 08/20/17 0929  . mometasone-formoterol (DULERA) 200-5 MCG/ACT inhaler 2 puff  2 puff Inhalation BID Auburn BilberryPatel, Shreyang, MD   2 puff at 08/20/17 0925  . mupirocin ointment (BACTROBAN) 2 % 1 application  1 application Nasal BID Merwyn KatosSimonds, David B, MD   1 application at 08/20/17 0929  . nicotine (NICODERM CQ - dosed in mg/24 hours) patch 14 mg  14 mg Transdermal Daily Milagros LollSudini, Srikar, MD   14 mg at 08/20/17 0927  . ondansetron (ZOFRAN) injection 4 mg  4 mg Intravenous Q6H PRN Auburn BilberryPatel, Shreyang, MD   4 mg at 08/20/17 0503  . oxyCODONE (Oxy IR/ROXICODONE) immediate release tablet 10 mg  10 mg Oral Q6H PRN Gouru, Aruna, MD   10 mg at 08/20/17 0503  . oxyCODONE (Oxy IR/ROXICODONE) immediate release tablet 5 mg  5 mg Oral Q6H PRN Gouru, Aruna, MD   5 mg at 08/19/17 0919  . pantoprazole (PROTONIX) EC tablet 40 mg  40 mg Oral QAC breakfast Auburn BilberryPatel, Shreyang, MD   40 mg at 08/20/17 0923  . piperacillin-tazobactam (ZOSYN) IVPB 3.375 g  3.375 g Intravenous Q8H Pabon, Diego F, MD      . polyethylene glycol (MIRALAX / GLYCOLAX) packet 17 g  17 g Oral Daily PRN Eugenie NorrieBlakeney, Dana G, NP   17 g at 08/18/17 0601  . pramipexole (MIRAPEX) tablet 1 mg  1 mg Oral QHS Auburn BilberryPatel, Shreyang, MD   1 mg at 08/19/17 2132  . theophylline (UNIPHYL) 400 MG 24 hr tablet 400 mg  400 mg Oral Daily Milagros LollSudini, Srikar, MD   400 mg at 08/20/17 0924  . Vitamin D (Ergocalciferol) (DRISDOL) capsule 50,000 Units  50,000 Units Oral Q14 Days Auburn BilberryPatel, Shreyang, MD         Discharge Medications: Please see discharge summary for a list of discharge medications.  Relevant  Imaging Results:  Relevant Lab Results:   Additional Information SSN [id number]  Darleene Cleavernterhaus, Khai Arrona R, [place]

## 2017-08-20 NOTE — Progress Notes (Signed)
Bladder scanned patient yielded 315 cc, Dr. Amado CoeGouru notified and received orders to insert foley. Will continue to monitor.

## 2017-08-20 NOTE — Anesthesia Postprocedure Evaluation (Signed)
Anesthesia Post Note  Patient: Brenda Glenn  Procedure(s) Performed: RECTAL EXAM UNDER ANESTHESIA (N/A ) IRRIGATION AND DEBRIDEMENT PERIRECTAL ABSCESS (N/A )  Patient location during evaluation: PACU Anesthesia Type: General Level of consciousness: awake and alert Pain management: pain level controlled Vital Signs Assessment: post-procedure vital signs reviewed and stable Respiratory status: spontaneous breathing, nonlabored ventilation, respiratory function stable and patient connected to nasal cannula oxygen Cardiovascular status: blood pressure returned to baseline and stable Postop Assessment: no apparent nausea or vomiting Anesthetic complications: no     Last Vitals:  Vitals:   08/20/17 1837 08/20/17 1949  BP: 137/74 137/78  Pulse:  (!) 103  Resp: 16 19  Temp: (!) 36.3 C 36.9 C  SpO2: 95% 97%    Last Pain:  Vitals:   08/20/17 1957  TempSrc:   PainSc: 4                  Lenard SimmerAndrew Gaige Fussner

## 2017-08-20 NOTE — Anesthesia Preprocedure Evaluation (Signed)
Anesthesia Evaluation  Patient identified by MRN, date of birth, ID band Patient awake    Reviewed: Allergy & Precautions, NPO status , Patient's Chart, lab work & pertinent test results  History of Anesthesia Complications Negative for: history of anesthetic complications  Airway Mallampati: III  TM Distance: >3 FB Neck ROM: Full    Dental  (+) Poor Dentition, Upper Dentures   Pulmonary shortness of breath and Long-Term Oxygen Therapy, sleep apnea , COPD,  oxygen dependent, neg recent URI, Current Smoker,    breath sounds clear to auscultation- rhonchi (-) wheezing      Cardiovascular hypertension, Pt. on medications (-) angina(-) CAD, (-) Past MI and (-) Cardiac Stents + dysrhythmias Atrial Fibrillation + Valvular Problems/Murmurs  Rhythm:Regular Rate:Normal - Systolic murmurs and - Diastolic murmurs    Neuro/Psych negative neurological ROS  negative psych ROS   GI/Hepatic Neg liver ROS, GERD  ,  Endo/Other  diabetes  Renal/GU Renal InsufficiencyRenal disease     Musculoskeletal  (+) Arthritis ,   Abdominal (+) + obese,   Peds  Hematology negative hematology ROS (+)   Anesthesia Other Findings Past Medical History: No date: Arthritis     Comment:  all over No date: Back pain No date: COPD (chronic obstructive pulmonary disease) (HCC)     Comment:  O2 use continuosly at 2 liters/ some  asthma symptoms No date: Diabetes mellitus without complication (HCC)     Comment:  diet controlled No date: GERD (gastroesophageal reflux disease) No date: Heart murmur     Comment:  lifelong No date: Hypercholesterolemia No date: Hypertension     Comment:  controlled on meds 2015: Pneumonia     Comment:  [facility] hospitalized No date: Renal insufficiency     Comment:  early stage kidney failure No date: Shortness of breath dyspnea No date: Sleep apnea     Comment:  2nd sleep study said pt does not have sleep apnea No date:  Swelling     Comment:  of feet and legs   Reproductive/Obstetrics                             Anesthesia Physical  Anesthesia Plan  ASA: IV  Anesthesia Plan: General   Post-op Pain Management:    Induction: Intravenous  PONV Risk Score and Plan: 1 and Ondansetron and Dexamethasone  Airway Management Planned: LMA  Additional Equipment:   Intra-op Plan:   Post-operative Plan: Extubation in OR and Possible Post-op intubation/ventilation  Informed Consent: I have reviewed the patients History and Physical, chart, labs and discussed the procedure including the risks, benefits and alternatives for the proposed anesthesia with the patient or authorized representative who has indicated his/her understanding and acceptance.   Dental advisory given  Plan Discussed with: CRNA and Anesthesiologist  Anesthesia Plan Comments: (Discussed possible need for intubation/ventilation given hx of severe COPD and need for home O2)        Anesthesia Quick Evaluation

## 2017-08-20 NOTE — Anesthesia Procedure Notes (Signed)
Procedure Name: LMA Insertion Date/Time: 08/20/2017 4:02 PM Performed by: Karoline CaldwellStarr, Timotheus Salm, CRNA Pre-anesthesia Checklist: Patient identified, Patient being monitored, Timeout performed, Emergency Drugs available and Suction available Patient Re-evaluated:Patient Re-evaluated prior to induction Oxygen Delivery Method: Circle system utilized Preoxygenation: Pre-oxygenation with 100% oxygen Induction Type: IV induction Ventilation: Mask ventilation without difficulty LMA: LMA inserted LMA Size: 3.5 Tube type: Oral Number of attempts: 1 Placement Confirmation: positive ETCO2 and breath sounds checked- equal and bilateral Tube secured with: Tape Dental Injury: Teeth and Oropharynx as per pre-operative assessment

## 2017-08-20 NOTE — Anesthesia Post-op Follow-up Note (Signed)
Anesthesia QCDR form completed.        

## 2017-08-20 NOTE — Progress Notes (Signed)
Hypoglycemic Event  CBG: 68  Treatment: D50 IV 25 mL  Symptoms: None  Follow-up CBG: Time:1220 CBG Result:113  Possible Reasons for Event: Inadequate meal intake  Comments/MD notified: Dr. Willette BraceGouru     [name]

## 2017-08-20 NOTE — Progress Notes (Signed)
Pharmacy Electrolyte Monitoring Consult:  Pharmacy consulted to assist in monitoring and replacing electrolytes in this 77 y.o. female admitted on 08/15/2017 with Urinary Retention and Multiple Complaints   Labs:  Sodium (mmol/L)  Date Value  08/20/2017 140  04/03/2013 132 (L)   Potassium (mmol/L)  Date Value  08/20/2017 4.6  04/03/2013 3.3 (L)   Magnesium (mg/dL)  Date Value  16/10/960406/21/2019 1.9   Calcium (mg/dL)  Date Value  54/09/811906/21/2019 5.9 (LL)   Calcium, Total (mg/dL)  Date Value  14/78/295602/04/2013 8.6   Albumin (g/dL)  Date Value  21/30/865706/18/2019 1.9 (L)  02/20/2013 3.6   CCa: 7.6 mg/dL  Assessment/Plan: Will order calcium gluconate 2 g iv x 2 and discuss oral calcium supplementation with MD. Patient is on vitamin D [id number] units every 14 days with no Ca supplementation.  Will f/u AM labs.   Luisa HartChristy, Tomothy Eddins D 08/20/2017 7:40 AM

## 2017-08-20 NOTE — Op Note (Signed)
  08/15/2017 - 08/20/2017  4:41 PM  PATIENT:  Brenda Glenn  77 y.o. female  PRE-OPERATIVE DIAGNOSIS:  Horseshoe  abscess  POST-OPERATIVE DIAGNOSIS:  Same  PROCEDURE:   1. Incision and drainage of complex horseshoe abscess 2. Excisional Sharp debridement of skin subcutaneous tissue and muscle  measuring 20 square centimeters    SURGEON:  Surgeon(s) and Role:    * Deshanti Adcox F, MD - Primary   ANESTHESIA: GETA  INDICATIONS FOR PROCEDURE Horseshoe  abscess  DICTATION:  Patient was playing about proceeding detail, risk benefits possible complications and a consent was obtained. The patient taken to the operating room and placed in the lithotomy position. Exam under anesthesia revealed a posterior bilateral ischiorectal  abscess consistent with horseshoe abscess. Incision was created with a 15 blade knife and copious amount of purulent material was drained on each side.  We performed lateral incisions bilaterally and join them on a counterincision on the posterior midline.  Cultures were obtained. Complex loculations that were broken down using finger dissection and suction device. Electrocautery was used to obtain hemostasis.  Using a sharp curette we debrided subcutaneous tissue skin and muscle incompetency and 2 areas of 10 x 2 cm each. Stasis obtained with electrocautery and copious amount of irrigation was used. Half inch Penrose drains were brought to each of the cavity.  Total number of pyrosis were 2 and there were tied with silk sutures in the standard fashion.  Needle and laparotomy counts were correct and there were no immediate complications  Leafy Roiego F Candid Bovey, MD

## 2017-08-20 NOTE — Progress Notes (Signed)
Hypoglycemic Event  CBG: 67  Treatment: 25 ml of OJ  Symptoms: None  Follow-up CBG: Time:0840 CBG Result:88  Possible Reasons for Event: Inadequate meal intake    Brenda NoelErica Y Marc Glenn

## 2017-08-20 NOTE — Progress Notes (Signed)
Foley removed.

## 2017-08-20 NOTE — Consult Note (Signed)
Patient ID: Brenda Glenn, female   DOB: June 04, 1940, 77 y.o.   MRN: [id number]  HPI Brenda Glenn is a 77 y.o. female in consultation at the request of Dr. Amado CoeGouru for "hemorrhoids". Patient reports that she is having sharp intermittent anorectal pain for the last 2 to 3 weeks.  Worsening with certain movements and with bowel movements. She has had a history of hemorrhoids.  She was admitted for urinary sepsis and was placed on antibiotics with good response. Anusol was prescribed w/o relieve.  Significant comorbidities including COPD on home oxygen.  Diabetes hypertension and chronic kidney disease.  Creatinine currently is normal 0.9 CBC 2 days ago was 14.7 with a left shift hemoglobin 10.6. Did have a CT of the chest that I have personally reviewed 5 days ago.  Showing some anasarca no other major issues other than a pleural effusion and some consolidation within the lungs  HPI  Past Medical History:  Diagnosis Date  . Arthritis    all over  . Back pain   . COPD (chronic obstructive pulmonary disease) (HCC)    O2 use continuosly at 2 liters/ some  asthma symptoms  . Diabetes mellitus without complication (HCC)    diet controlled  . GERD (gastroesophageal reflux disease)   . Heart murmur    lifelong  . Hypercholesterolemia   . Hypertension    controlled on meds  . Pneumonia 2015   [facility]  . Renal insufficiency    early stage kidney failure  . Shortness of breath dyspnea   . Sleep apnea    2nd sleep study said pt does not have sleep apnea  . Swelling    of feet and legs    Past Surgical History:  Procedure Laterality Date  . ABDOMINAL HYSTERECTOMY  1988  . BACK SURGERY  1978 and 1995   residual nerve damage legs-weakness  . BLADDER SURGERY  2017  . CATARACT EXTRACTION Right   . CATARACT EXTRACTION W/PHACO Left 09/05/2014   Procedure: CATARACT EXTRACTION PHACO AND INTRAOCULAR LENS PLACEMENT (IOC);  Surgeon: Lockie Molahadwick Brasington, MD;  [facility];  Service: Ophthalmology;  Laterality: Left;  DIABETIC  . CHOLECYSTECTOMY  2011  . JOINT REPLACEMENT Right 2009  . REPLACEMENT TOTAL KNEE Right   . REVERSE SHOULDER ARTHROPLASTY Right 01/08/2017   Procedure: REVERSE SHOULDER ARTHROPLASTY;  Surgeon: Signa KellPatel, Sunny, MD;  Location: [facility];  Service: Orthopedics;  Laterality: Right;    Family History  Problem Relation Age of Onset  . Breast cancer Mother 1075  . Breast cancer Paternal Grandmother 6770    Social History Social History   Tobacco Use  . Smoking status: Current Every Day Smoker    Packs/day: 0.25    Years: 50.00    Pack years: 12.50    Types: Cigarettes  . Smokeless tobacco: Never Used  Substance Use Topics  . Alcohol use: Yes    Alcohol/week: 1.8 oz    Types: 3 Glasses of wine per week    Comment: rarely  . Drug use: No    Allergies  Allergen Reactions  . Tequin [Gatifloxacin] Shortness Of Breath  . Bupropion     Other reaction(s): Other (See Comments) shaking  . Keflex [Cephalexin] Other (See Comments)    Unknown  . Lisinopril Other (See Comments)    Unknown  . Varenicline Nausea Only and Nausea And Vomiting    Current Facility-Administered Medications  Medication Dose Route Frequency Provider Last Rate Last Dose  . acetaminophen (TYLENOL) tablet  650 mg  650 mg Oral Q6H PRN Auburn Bilberry, MD   650 mg at 08/17/17 2146  . allopurinol (ZYLOPRIM) tablet 200 mg  200 mg Oral Daily Auburn Bilberry, MD   200 mg at 08/20/17 0924  . aspirin EC tablet 81 mg  81 mg Oral Daily Auburn Bilberry, MD   81 mg at 08/20/17 0924  . calcium gluconate 2 g in sodium chloride 0.9 % 100 mL IVPB  2 g Intravenous Q12H Gouru, Aruna, MD 120 mL/hr at 08/20/17 1025 2 g at 08/20/17 1025  . Chlorhexidine Gluconate Cloth 2 % PADS 6 each  6 each Topical Q0600 Merwyn Katos, MD   6 each at 08/18/17 0617  . diclofenac (FLECTOR) 1.3 % 1 patch  1 patch Transdermal Daily PRN Auburn Bilberry, MD      . docusate sodium (COLACE) capsule 100  mg  100 mg Oral BID Merwyn Katos, MD   100 mg at 08/20/17 0981  . enoxaparin (LOVENOX) injection 40 mg  40 mg Subcutaneous Q24H Merwyn Katos, MD   40 mg at 08/19/17 2132  . ezetimibe (ZETIA) tablet 10 mg  10 mg Oral QHS Auburn Bilberry, MD   10 mg at 08/19/17 2132  . guaiFENesin (MUCINEX) 12 hr tablet 600 mg  600 mg Oral Q12H PRN Auburn Bilberry, MD      . insulin aspart (novoLOG) injection 0-15 Units  0-15 Units Subcutaneous TID WC Merwyn Katos, MD   2 Units at 08/18/17 1144  . insulin aspart (novoLOG) injection 0-5 Units  0-5 Units Subcutaneous QHS Merwyn Katos, MD      . ipratropium-albuterol (DUONEB) 0.5-2.5 (3) MG/3ML nebulizer solution 3 mL  3 mL Nebulization Q6H PRN Merwyn Katos, MD      . metoprolol succinate (TOPROL-XL) 24 hr tablet 100 mg  100 mg Oral Daily Erin Fulling, MD   100 mg at 08/20/17 0929  . mometasone-formoterol (DULERA) 200-5 MCG/ACT inhaler 2 puff  2 puff Inhalation BID Auburn Bilberry, MD   2 puff at 08/20/17 0925  . mupirocin ointment (BACTROBAN) 2 % 1 application  1 application Nasal BID Merwyn Katos, MD   1 application at 08/20/17 0929  . nicotine (NICODERM CQ - dosed in mg/24 hours) patch 14 mg  14 mg Transdermal Daily Milagros Loll, MD   14 mg at 08/20/17 0927  . ondansetron (ZOFRAN) injection 4 mg  4 mg Intravenous Q6H PRN Auburn Bilberry, MD   4 mg at 08/20/17 0503  . oxyCODONE (Oxy IR/ROXICODONE) immediate release tablet 10 mg  10 mg Oral Q6H PRN Gouru, Aruna, MD   10 mg at 08/20/17 0503  . oxyCODONE (Oxy IR/ROXICODONE) immediate release tablet 5 mg  5 mg Oral Q6H PRN Gouru, Aruna, MD   5 mg at 08/19/17 0919  . pantoprazole (PROTONIX) EC tablet 40 mg  40 mg Oral QAC breakfast Auburn Bilberry, MD   40 mg at 08/20/17 0923  . piperacillin-tazobactam (ZOSYN) IVPB 3.375 g  3.375 g Intravenous Q8H Daviel Allegretto F, MD      . polyethylene glycol (MIRALAX / GLYCOLAX) packet 17 g  17 g Oral Daily PRN Eugenie Norrie, NP   17 g at 08/18/17 0601  .  pramipexole (MIRAPEX) tablet 1 mg  1 mg Oral QHS Auburn Bilberry, MD   1 mg at 08/19/17 2132  . theophylline (UNIPHYL) 400 MG 24 hr tablet 400 mg  400 mg Oral Daily Milagros Loll, MD   400 mg at 08/20/17  1610  . Vitamin D (Ergocalciferol) (DRISDOL) capsule 50,000 Units  50,000 Units Oral Q14 Days Auburn Bilberry, MD         Review of Systems Full ROS  was asked and was negative except for the information on the HPI  Physical Exam Blood pressure 114/61, pulse 95, temperature 97.9 F (36.6 C), temperature source Oral, resp. rate 18, height 4\' 11"  (1.499 m), weight 83.9 kg (185 lb), SpO2 97 %. CONSTITUTIONAL: debilitated female, obese EYES: Pupils are equal, round, and reactive to light, Sclera are non-icteric. EARS, NOSE, MOUTH AND THROAT: The oropharynx is clear. The oral mucosa is pink and moist. Hearing is intact to voice. LYMPH NODES:  Lymph nodes in the neck are normal. RESPIRATORY:  Lungs are clear. There is normal respiratory effort, with equal breath sounds bilaterally, and without pathologic use of accessory muscles. CARDIOVASCULAR: Heart is regular without murmurs, gallops, or rubs. GI: The abdomen is  soft, nontender, and nondistended. There are no palpable masses. There is no hepatosplenomegaly. There are normal bowel sounds in all quadrants.  Rectal skin tags.  More importantly there is induration erythema surrounding the anus consistent with a horseshoe abscess.  Exquisitely tender to palpation.  No Obvious evidence of necrotizing soft tissue infection MUSCULOSKELETAL: Normal muscle strength and tone. No cyanosis or edema.   SKIN: Turgor is good and there are no pathologic skin lesions or ulcers. NEUROLOGIC: Motor and sensation is grossly normal. Cranial nerves are grossly intact. PSYCH:  Oriented to person, place and time. Affect is normal.  Data Reviewed  I have personally reviewed the patient's imaging, laboratory findings and medical records.    Assessment/Plan Perianal  Horseshoe abscess.  The patient detail about the findings and the rationale for operative intervention.  We will start broad-spectrum antibiotics, place her n.p.o. and schedule her for this afternoon for exam under anesthesia and drainage and possible debridement of horseshoe abscess.  Discussed with the patient detail about the risk benefit and possible complications.  She understands and wishes to proceed.  Sterling Big, MD FACS General Surgeon 08/20/2017, 10:49 AM

## 2017-08-21 ENCOUNTER — Encounter: Payer: Self-pay | Admitting: Surgery

## 2017-08-21 DIAGNOSIS — G2581 Restless legs syndrome: Secondary | ICD-10-CM

## 2017-08-21 LAB — GLUCOSE, CAPILLARY
GLUCOSE-CAPILLARY: 114 mg/dL — AB (ref 65–99)
Glucose-Capillary: 84 mg/dL (ref 65–99)
Glucose-Capillary: 88 mg/dL (ref 65–99)
Glucose-Capillary: 98 mg/dL (ref 65–99)

## 2017-08-21 LAB — BASIC METABOLIC PANEL
ANION GAP: 11 (ref 5–15)
BUN: 14 mg/dL (ref 6–20)
CALCIUM: 6.4 mg/dL — AB (ref 8.9–10.3)
CO2: 21 mmol/L — ABNORMAL LOW (ref 22–32)
Chloride: 107 mmol/L (ref 101–111)
Creatinine, Ser: 1.05 mg/dL — ABNORMAL HIGH (ref 0.44–1.00)
GFR calc Af Amer: 58 mL/min — ABNORMAL LOW (ref >60)
GFR, EST NON AFRICAN AMERICAN: 50 mL/min — AB (ref >60)
GLUCOSE: 95 mg/dL (ref 65–99)
Potassium: 5.3 mmol/L — ABNORMAL HIGH (ref 3.5–5.1)
Sodium: 139 mmol/L (ref 135–145)

## 2017-08-21 LAB — FERRITIN: Ferritin: 139 ng/mL (ref 11–307)

## 2017-08-21 LAB — URINE CULTURE
Culture: >=100000 — AB
Special Requests: NORMAL

## 2017-08-21 LAB — CALCIUM, IONIZED: CALCIUM, IONIZED, SERUM: 3.8 mg/dL — AB (ref 4.5–5.6)

## 2017-08-21 LAB — ALBUMIN: Albumin: 2.2 g/dL — ABNORMAL LOW (ref 3.5–5.0)

## 2017-08-21 MED ORDER — LORAZEPAM 0.5 MG PO TABS
0.5000 mg | ORAL_TABLET | Freq: Four times a day (QID) | ORAL | Status: DC | PRN
  Administered 2017-08-22 – 2017-08-26 (×6): 0.5 mg via ORAL
  Filled 2017-08-21 (×6): qty 1

## 2017-08-21 MED ORDER — GABAPENTIN 600 MG PO TABS
300.0000 mg | ORAL_TABLET | Freq: Three times a day (TID) | ORAL | Status: DC
  Administered 2017-08-21: 300 mg via ORAL
  Filled 2017-08-21: qty 1

## 2017-08-21 MED ORDER — SODIUM POLYSTYRENE SULFONATE 15 GM/60ML PO SUSP
15.0000 g | Freq: Once | ORAL | Status: AC
  Administered 2017-08-21: 15 g via ORAL
  Filled 2017-08-21: qty 60

## 2017-08-21 MED ORDER — PRAMIPEXOLE DIHYDROCHLORIDE 0.25 MG PO TABS
1.0000 mg | ORAL_TABLET | ORAL | Status: AC
Start: 2017-08-21 — End: 2017-08-21
  Administered 2017-08-21: 1 mg via ORAL
  Filled 2017-08-21: qty 4

## 2017-08-21 MED ORDER — GABAPENTIN 400 MG PO CAPS
400.0000 mg | ORAL_CAPSULE | Freq: Three times a day (TID) | ORAL | Status: DC
  Administered 2017-08-21 – 2017-08-26 (×14): 400 mg via ORAL
  Filled 2017-08-21 (×14): qty 1

## 2017-08-21 MED ORDER — VITAMIN B-1 100 MG PO TABS
100.0000 mg | ORAL_TABLET | Freq: Every day | ORAL | Status: DC
  Administered 2017-08-22 – 2017-08-26 (×5): 100 mg via ORAL
  Filled 2017-08-21 (×6): qty 1

## 2017-08-21 MED ORDER — THIAMINE HCL 100 MG/ML IJ SOLN
100.0000 mg | Freq: Every day | INTRAMUSCULAR | Status: DC
  Filled 2017-08-21 (×2): qty 2

## 2017-08-21 MED ORDER — ADULT MULTIVITAMIN W/MINERALS CH
1.0000 | ORAL_TABLET | Freq: Every day | ORAL | Status: DC
  Administered 2017-08-22 – 2017-08-26 (×5): 1 via ORAL
  Filled 2017-08-21 (×6): qty 1

## 2017-08-21 MED ORDER — FOLIC ACID 1 MG PO TABS
1.0000 mg | ORAL_TABLET | Freq: Every day | ORAL | Status: DC
  Administered 2017-08-22 – 2017-08-26 (×5): 1 mg via ORAL
  Filled 2017-08-21 (×6): qty 1

## 2017-08-21 MED ORDER — LORAZEPAM 2 MG/ML IJ SOLN: 1.0000 mg | INTRAMUSCULAR | Status: DC | PRN

## 2017-08-21 MED ORDER — OXYCODONE HCL 5 MG PO TABS
15.0000 mg | ORAL_TABLET | Freq: Four times a day (QID) | ORAL | Status: DC
  Administered 2017-08-21: 15 mg via ORAL
  Filled 2017-08-21: qty 3

## 2017-08-21 MED ORDER — HYDROMORPHONE HCL 1 MG/ML IJ SOLN
1.0000 mg | Freq: Once | INTRAMUSCULAR | Status: AC
  Administered 2017-08-21: 1 mg via INTRAVENOUS
  Filled 2017-08-21: qty 1

## 2017-08-21 MED ORDER — OXYCODONE HCL 5 MG PO TABS
15.0000 mg | ORAL_TABLET | ORAL | Status: DC
  Administered 2017-08-21 – 2017-08-23 (×8): 15 mg via ORAL
  Filled 2017-08-21 (×8): qty 3

## 2017-08-21 MED ORDER — LORAZEPAM 2 MG/ML IJ SOLN: 1.0000 mg | Freq: Four times a day (QID) | INTRAMUSCULAR | Status: DC | PRN

## 2017-08-21 MED ORDER — PROCHLORPERAZINE EDISYLATE 10 MG/2ML IJ SOLN
10.0000 mg | Freq: Four times a day (QID) | INTRAMUSCULAR | Status: DC | PRN
  Administered 2017-08-21: 10 mg via INTRAVENOUS
  Filled 2017-08-21 (×3): qty 2

## 2017-08-21 MED ORDER — LORAZEPAM 1 MG PO TABS
1.0000 mg | ORAL_TABLET | Freq: Four times a day (QID) | ORAL | Status: DC | PRN
  Administered 2017-08-21: 1 mg via ORAL
  Filled 2017-08-21: qty 1

## 2017-08-21 NOTE — Progress Notes (Signed)
PT Cancellation Note  Patient Details Name: Brenda NoJamie Graves Glenn MRN: [id number] DOB: February 08, 1941   Cancelled Treatment:    Reason Eval/Treat Not Completed: Other (comment)   Chart reviewed.  Pt with debridement yesterday.  Will require PT re-eval.  Pt has continue on transfer orders.  Discussed with primary PT.   Danielle DessSarah Zoltan Genest 08/21/2017, 8:42 AM

## 2017-08-21 NOTE — Clinical Social Work Note (Signed)
Clinical Social Work Assessment  Patient Details  Name: Brenda Glenn MRN: [id number] Date of Birth: [birth year]  Date of referral:  08/21/17               Reason for consult:  Facility Placement                Permission sought to share information with:  Oceanographeracility Contact Representative Permission granted to share information::  Yes, Verbal Permission Granted  Name::        Agency::  [facility]  Relationship::     Contact Information:     Housing/Transportation Living arrangements for the past 2 months:  Single Family Home Source of Information:  Medical Team, Spouse, Adult Children Patient Interpreter Needed:  None Criminal Activity/Legal Involvement Pertinent to Current Situation/Hospitalization:  No - Comment as needed Significant Relationships:  Adult Children, Merchandiser, retailCommunity Support, Spouse Lives with:  Spouse Do you feel safe going back to the place where you live?  Yes Need for family participation in patient care:  Yes (Comment)(Patient has s/s of inpatient delirium at this time.)  Care giving concerns:  PT recommendation for SNF   Social Worker assessment / plan:  The CSW attempted to meet with family at bedside but was not successful. The CSW spoke with the patient's daughter, [name], by phone to discuss discharge planning. [name] shared that the family is in agreement with short term rehab placement. The CSW provided a list of facilities for the referral process and noted which are able to accept the patient. The CSW provided a physical copy via tube to be placed on the patient's chart.  The CSW will continue to follow to facilitate discharge. The patient will most likely discharge Monday if stable.   Employment status:  Retired Health and safety inspectornsurance information:  [employer] PT Recommendations:  Skilled Nursing Facility Information / Referral to community resources:  Skilled Nursing Facility  Patient/Family's Response to care:  The patient's family thanked the  CSW.  Patient/Family's Understanding of and Emotional Response to Diagnosis, Current Treatment, and Prognosis:  The patient's family understand and agree with the discharge plan to SNF when stable.  Emotional Assessment Appearance:  Appears stated age Attitude/Demeanor/Rapport:  Other, Lethargic(Stable) Affect (typically observed):  Stable Orientation:  Oriented to Self Alcohol / Substance use:  Never Used Psych involvement (Current and /or in the community):  No (Comment)  Discharge Needs  Concerns to be addressed:  Care Coordination, Discharge Planning Concerns Readmission within the last 30 days:  No Current discharge risk:  Chronically ill Barriers to Discharge:  Continued Medical Work up   UAL CorporationKaren M Tamaya Pun, LCSW 08/21/2017, 3:42 PM

## 2017-08-21 NOTE — Progress Notes (Signed)
PT Cancellation Note  Patient Details Name: [name] NoJamie Graves Danek MRN: [id number] DOB: 01-23-1941   Cancelled Treatment:    Reason Eval/Treat Not Completed: Medical issues which prohibited therapy(Per chart review, patient noted with I and D to rectal abscess; continue upon transfer orders received.  Per discussion with primary RN, patient with increased confusion, agitation this date; recently received medication for calming.  Recommends hold at this time with re-attempt next date as medically appropriate.)  Murna Backer H. Manson PasseyBrown, PT, DPT, NCS 08/21/17, 11:25 AM [phone]

## 2017-08-21 NOTE — Consult Note (Signed)
[place] [facility] Consult   Reason for Consult: Consult for 77 year old woman who had surgery yesterday.  As I understand it consult is primarily for agitated behavior this morning Referring Physician: Gouru Patient Identification: Brenda Glenn MRN:  [id number] Principal Diagnosis: Restless leg syndrome Diagnosis:   Patient Active Problem List   Diagnosis Date Noted  . Restless leg syndrome [G25.81] 08/21/2017  . Perianal abscess [K61.0]   . Sepsis ([place]) [A41.9] 08/15/2017  . Bilateral lower extremity edema [R60.0] 07/28/2017  . Lower extremity numbness [R20.0] 07/28/2017  . ARF (acute renal failure) ([place]) [N17.9] 06/20/2017  . 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 Lymphedema [I89.0] 01/06/2016  . Chronic venous insufficiency [I87.2] 01/06/2016  . Pain in limb [M79.609] 01/06/2016  . COPD (chronic obstructive pulmonary disease) ([place]) [J44.9] 01/06/2016  . Abnormality of gait [R26.9] 01/06/2016    Total Time spent with patient: 1 hour  Subjective:   Brenda Glenn is a 76 y.o. female patient admitted with "I am sorry but my legs feel terrible".  HPI: Patient seen chart reviewed.  77 year old woman in the hospital with multiple medical problems including a recent urinary tract infection but who had surgery yesterday on an infected cyst in the hemorrhoid area.  Apparently according to nursing the patient became very agitated this morning and was pulling out her IV and seemed to be emotionally overwhelmed.  Patient seemed to calm down by the time I saw her but tells me that the problem is her restless legs.  She is moving both of her legs constantly during the interview and seems to be in a lot of discomfort.  She says they are driving her crazy and have been particularly bad today.  Patient denies any depression.  Denies any hallucinations or psychosis.  Denies any wish to harm anyone else.  She is alert and oriented but has a difficult time participating  in the interview because of how uncomfortable she is.  Patient says that at home she was taking her oxycodone 15 mg 6 tablets total a day which seems to be correct by the prescriptions I have found.  Social history: Patient lives at home with her husband.  Some family was visiting today.  Medical history: Urinary tract infection recently treated.  Infected hemorrhoid was treated yesterday.  Chronic arthritis chronic identified restless legs problems  Substance abuse history: Patient does not drink regularly.  She has been on pretty high dose oxycodone for years as I can find in the chart.  Patient says she has had some concerns that there was too much medicine but denies that anyone else that ever accused her of abusing it.  Denies that she is using any other drugs.  Past Psychiatric History: No identified past psychiatric history.  Risk to Self:   Risk to Others:   Prior Inpatient Therapy:   Prior Outpatient Therapy:    Past Medical History:  Past Medical History:  Diagnosis Date  . Arthritis    all over  . Back pain   . COPD (chronic obstructive pulmonary disease) (HCC)    O2 use continuosly at 2 liters/ some  asthma symptoms  . Diabetes mellitus without complication (HCC)    diet controlled  . GERD (gastroesophageal reflux disease)   . Heart murmur    lifelong  . Hypercholesterolemia   . Hypertension    controlled on meds  . Pneumonia 2015   [facility] hospitalized  . Renal insufficiency    early stage kidney failure  .  Shortness of breath dyspnea   . Sleep apnea    2nd sleep study said pt does not have sleep apnea  . Swelling    of feet and legs    Past Surgical History:  Procedure Laterality Date  . ABDOMINAL HYSTERECTOMY  1988  . BACK SURGERY  1978 and 1995   residual nerve damage legs-weakness  . BLADDER SURGERY  2017  . CATARACT EXTRACTION Right   . CATARACT EXTRACTION W/PHACO Left 09/05/2014   Procedure: CATARACT EXTRACTION PHACO AND INTRAOCULAR LENS PLACEMENT (IOC);   Surgeon: Leandrew Koyanagi, MD;  Location: [facility];  Service: Ophthalmology;  Laterality: Left;  DIABETIC  . CHOLECYSTECTOMY  2011  . INCISION AND DRAINAGE PERIRECTAL ABSCESS N/A 08/20/2017   Procedure: IRRIGATION AND DEBRIDEMENT PERIRECTAL ABSCESS;  Surgeon: Jules Husbands, MD;  Location: [facility];  Service: General;  Laterality: N/A;  . JOINT REPLACEMENT Right 2009  . RECTAL EXAM UNDER ANESTHESIA N/A 08/20/2017   Procedure: RECTAL EXAM UNDER ANESTHESIA;  Surgeon: Jules Husbands, MD;  Location: [facility];  Service: General;  Laterality: N/A;  . REPLACEMENT TOTAL KNEE Right   . REVERSE SHOULDER ARTHROPLASTY Right 01/08/2017   Procedure: REVERSE SHOULDER ARTHROPLASTY;  Surgeon: Leim Fabry, MD;  Location: [facility];  Service: Orthopedics;  Laterality: Right;   Family History:  Family History  Problem Relation Age of Onset  . Breast cancer Mother 19  . Breast cancer Paternal Grandmother 36   Family Psychiatric  History: Unknown Social History:  Social History   Substance and Sexual Activity  Alcohol Use Yes  . Alcohol/week: 1.8 oz  . Types: 3 Glasses of wine per week   Comment: rarely     Social History   Substance and Sexual Activity  Drug Use No    Social History   Socioeconomic History  . Marital status: Married    Spouse name: Not on file  . Number of children: Not on file  . Years of education: Not on file  . Highest education level: Not on file  Occupational History  . Not on file  Social Needs  . Financial resource strain: Not on file  . Food insecurity:    Worry: Not on file    Inability: Not on file  . Transportation needs:    Medical: Not on file    Non-medical: Not on file  Tobacco Use  . Smoking status: Current Every Day Smoker    Packs/day: 0.25    Years: 50.00    Pack years: 12.50    Types: Cigarettes  . Smokeless tobacco: Never Used  Substance and Sexual Activity  . Alcohol use: Yes    Alcohol/week: 1.8 oz    Types: 3 Glasses of wine  per week    Comment: rarely  . Drug use: No  . Sexual activity: Not on file  Lifestyle  . Physical activity:    Days per week: Not on file    Minutes per session: Not on file  . Stress: Not on file  Relationships  . Social connections:    Talks on phone: Not on file    Gets together: Not on file    Attends religious service: Not on file    Active member of club or organization: Not on file    Attends meetings of clubs or organizations: Not on file    Relationship status: Not on file  Other Topics Concern  . Not on file  Social History Narrative  . Not on file  Additional Social History:    Allergies:   Allergies  Allergen Reactions  . Tequin [Gatifloxacin] Shortness Of Breath  . Bupropion     Other reaction(s): Other (See Comments) shaking  . Keflex [Cephalexin] Other (See Comments)    Unknown  . Lisinopril Other (See Comments)    Unknown  . Varenicline Nausea Only and Nausea And Vomiting    Labs:  Results for orders placed or performed during the hospital encounter of 08/15/17 (from the past 48 hour(s))  Glucose, capillary     Status: None   Collection Time: 08/19/17  9:05 PM  Result Value Ref Range   Glucose-Capillary 90 65 - 99 mg/dL  Basic metabolic panel     Status: Abnormal   Collection Time: 08/20/17  4:53 AM  Result Value Ref Range   Sodium 140 135 - 145 mmol/L   Potassium 4.6 3.5 - 5.1 mmol/L   Chloride 110 101 - 111 mmol/L   CO2 21 (L) 22 - 32 mmol/L   Glucose, Bld 72 65 - 99 mg/dL   BUN 16 6 - 20 mg/dL   Creatinine, Ser 0.95 0.44 - 1.00 mg/dL   Calcium 5.9 (LL) 8.9 - 10.3 mg/dL    Comment: CRITICAL RESULT CALLED TO, READ BACK BY AND VERIFIED WITH BRITTANY PECINICH _0  08/20/17 [facility]    GFR calc non Af Amer 57 (L) >60 mL/min   GFR calc Af Amer >60 >60 mL/min    Comment: (NOTE) The eGFR has been calculated using the CKD EPI equation. This calculation has not been validated in all clinical situations. eGFR's persistently <60 mL/min signify  possible Chronic Kidney Disease.    Anion gap 9 5 - 15    Comment: Performed at [facility], [address] [place], [place] [address]  Magnesium     Status: None   Collection Time: 08/20/17  4:53 AM  Result Value Ref Range   Magnesium 1.9 1.7 - 2.4 mg/dL    Comment: Performed at [facility], [address] [place], [place] [address]  Glucose, capillary     Status: None   Collection Time: 08/20/17  7:48 AM  Result Value Ref Range   Glucose-Capillary 67 65 - 99 mg/dL  Glucose, capillary     Status: None   Collection Time: 08/20/17  8:44 AM  Result Value Ref Range   Glucose-Capillary 88 65 - 99 mg/dL  Glucose, capillary     Status: None   Collection Time: 08/20/17 11:40 AM  Result Value Ref Range   Glucose-Capillary 68 65 - 99 mg/dL  Glucose, capillary     Status: Abnormal   Collection Time: 08/20/17 12:20 PM  Result Value Ref Range   Glucose-Capillary 113 (H) 65 - 99 mg/dL  Aerobic/Anaerobic Culture (surgical/deep wound)     Status: None (Preliminary result)   Collection Time: 08/20/17  4:22 PM  Result Value Ref Range   Specimen Description      ABSCESS PERIRECTAL Performed at [facility], [place] [address], [place], [place] [address]    Special Requests PENDING    Gram Stain NO WBC SEEN FEW GRAM POSITIVE COCCI     Culture      CULTURE REINCUBATED FOR BETTER GROWTH NO ANAEROBES ISOLATED; CULTURE IN PROGRESS FOR 5 DAYS    Report Status PENDING   Glucose, capillary     Status: None   Collection Time: 08/20/17  5:05 PM  Result Value Ref Range   Glucose-Capillary 78 65 - 99 mg/dL  Glucose, capillary     Status: None   Collection Time: 08/20/17  6:39 PM  Result Value Ref Range   Glucose-Capillary 85 65 - 99 mg/dL  Glucose, capillary     Status: None   Collection Time: 08/20/17  8:59 PM  Result Value Ref Range   Glucose-Capillary 95 65 - 99 mg/dL   Comment 1 Notify RN   Basic metabolic panel     Status: Abnormal   Collection Time: 08/21/17   5:47 AM  Result Value Ref Range   Sodium 139 135 - 145 mmol/L   Potassium 5.3 (H) 3.5 - 5.1 mmol/L   Chloride 107 101 - 111 mmol/L   CO2 21 (L) 22 - 32 mmol/L   Glucose, Bld 95 65 - 99 mg/dL   BUN 14 6 - 20 mg/dL   Creatinine, Ser 1.05 (H) 0.44 - 1.00 mg/dL   Calcium 6.4 (LL) 8.9 - 10.3 mg/dL    Comment: CRITICAL RESULT CALLED TO, READ BACK BY AND VERIFIED WITH ELAINA HODGES 08/21/17 @ 0735  MLK    GFR calc non Af Amer 50 (L) >60 mL/min   GFR calc Af Amer 58 (L) >60 mL/min    Comment: (NOTE) The eGFR has been calculated using the CKD EPI equation. This calculation has not been validated in all clinical situations. eGFR's persistently <60 mL/min signify possible Chronic Kidney Disease.    Anion gap 11 5 - 15    Comment: Performed at [facility], [address] [place],  [address]  Albumin     Status: Abnormal   Collection Time: 08/21/17  5:47 AM  Result Value Ref Range   Albumin 2.2 (L) 3.5 - 5.0 g/dL    Comment: Performed at [facility], [address] [place],  [address]  Glucose, capillary     Status: None   Collection Time: 08/21/17  8:31 AM  Result Value Ref Range   Glucose-Capillary 84 65 - 99 mg/dL  Glucose, capillary     Status: Abnormal   Collection Time: 08/21/17 12:21 PM  Result Value Ref Range   Glucose-Capillary 114 (H) 65 - 99 mg/dL  Glucose, capillary     Status: None   Collection Time: 08/21/17  4:43 PM  Result Value Ref Range   Glucose-Capillary 88 65 - 99 mg/dL    Current Facility-Administered Medications  Medication Dose Route Frequency Provider Last Rate Last Dose  . acetaminophen (TYLENOL) tablet 650 mg  650 mg Oral Q6H PRN Jules Husbands, MD   650 mg at 08/17/17 2146  . allopurinol (ZYLOPRIM) tablet 200 mg  200 mg Oral Daily Pabon, [place] F, MD   200 mg at 08/21/17 6283  . Chlorhexidine Gluconate Cloth 2 % PADS 6 each  6 each Topical Q0600 Jules Husbands, MD   6 each at 08/21/17 [phone]  . diclofenac (FLECTOR) 1.3 %  1 patch  1 patch Transdermal Daily PRN Pabon, Diego F, MD      . docusate sodium (COLACE) capsule 100 mg  100 mg Oral BID Caroleen Hamman F, MD   100 mg at 08/21/17 0921  . ezetimibe (ZETIA) tablet 10 mg  10 mg Oral QHS Caroleen Hamman F, MD   10 mg at 08/20/17 2247  . folic acid (FOLVITE) tablet 1 mg  1 mg Oral Daily Gouru, Aruna, MD      . gabapentin (NEURONTIN) tablet 400 mg  400 mg Oral TID Eleana Tocco, Madie Reno, MD      . guaiFENesin (  MUCINEX) 12 hr tablet 600 mg  600 mg Oral Q12H PRN Pabon, Diego F, MD      . insulin aspart (novoLOG) injection 0-15 Units  0-15 Units Subcutaneous TID WC Jules Husbands, MD   2 Units at 08/18/17 1144  . insulin aspart (novoLOG) injection 0-5 Units  0-5 Units Subcutaneous QHS Pabon, Diego F, MD      . ipratropium-albuterol (DUONEB) 0.5-2.5 (3) MG/3ML nebulizer solution 3 mL  3 mL Nebulization Q6H PRN Pabon, Diego F, MD      . LORazepam (ATIVAN) injection 1 mg  1 mg Intravenous Q4H PRN Kenyette Gundy T, MD      . LORazepam (ATIVAN) tablet 0.5 mg  0.5 mg Oral Q6H PRN Gouru, Aruna, MD      . metoprolol succinate (TOPROL-XL) 24 hr tablet 100 mg  100 mg Oral Daily Pabon, [place] F, MD   100 mg at 08/21/17 0921  . mometasone-formoterol (DULERA) 200-5 MCG/ACT inhaler 2 puff  2 puff Inhalation BID Jules Husbands, MD   2 puff at 08/21/17 [phone]  . multivitamin with minerals tablet 1 tablet  1 tablet Oral Daily Gouru, Aruna, MD      . nicotine (NICODERM CQ - dosed in mg/24 hours) patch 14 mg  14 mg Transdermal Daily Pabon, Diego F, MD   14 mg at 08/21/17 0921  . ondansetron (ZOFRAN) injection 4 mg  4 mg Intravenous Q6H PRN Caroleen Hamman F, MD   4 mg at 08/20/17 2305  . oxyCODONE (Oxy IR/ROXICODONE) immediate release tablet 15 mg  15 mg Oral Q4H Simone Rodenbeck T, MD      . oxyCODONE (Oxy IR/ROXICODONE) immediate release tablet 5 mg  5 mg Oral Q6H PRN Jules Husbands, MD   5 mg at 08/21/17 0156  . pantoprazole (PROTONIX) EC tablet 40 mg  40 mg Oral QAC breakfast Pabon, [place] F, MD   40 mg at  08/21/17 0921  . piperacillin-tazobactam (ZOSYN) IVPB 3.375 g  3.375 g Intravenous Q8H Pabon, Diego F, MD 12.5 mL/hr at 08/21/17 1321 3.375 g at 08/21/17 1321  . polyethylene glycol (MIRALAX / GLYCOLAX) packet 17 g  17 g Oral Daily PRN Caroleen Hamman F, MD   17 g at 08/18/17 0601  . pramipexole (MIRAPEX) tablet 1 mg  1 mg Oral QHS Pabon, [place] F, MD   1 mg at 08/20/17 2247  . pramipexole (MIRAPEX) tablet 1 mg  1 mg Oral NOW Kaiyon Hynes T, MD      . theophylline (UNIPHYL) 400 MG 24 hr tablet 400 mg  400 mg Oral Daily Pabon, [place] F, MD   400 mg at 08/21/17 0921  . thiamine (VITAMIN B-1) tablet 100 mg  100 mg Oral Daily Gouru, Aruna, MD       Or  . thiamine (B-1) injection 100 mg  100 mg Intravenous Daily Gouru, Aruna, MD      . Vitamin D (Ergocalciferol) (DRISDOL) capsule 50,000 Units  50,000 Units Oral Q14 Days Pabon, Marjory Lies, MD        Musculoskeletal: Strength & Muscle Tone: decreased Gait & Station: unable to stand Patient leans: N/A  Psychiatric Specialty Exam: Physical Exam  Nursing note and vitals reviewed. Constitutional: She appears well-developed and well-nourished.  HENT:  Head: Normocephalic and atraumatic.  Eyes: Pupils are equal, round, and reactive to light. Conjunctivae are normal.  Neck: Normal range of motion.  Cardiovascular: Normal heart sounds.  Respiratory: Effort normal.  GI: Soft.  Musculoskeletal: Normal range of motion.  Neurological: She is alert.  Patient was extremely uncomfortable with restless legs.  Both of her legs were moving constantly and seem to be in quite a bit of discomfort  Skin: Skin is warm and dry.  Psychiatric: Judgment normal. Her mood appears anxious. Her speech is delayed. She is slowed. Thought content is not paranoid. Cognition and memory are normal. She expresses no homicidal and no suicidal ideation.    Review of Systems  Constitutional: Negative.   HENT: Negative.   Eyes: Negative.   Respiratory: Negative.   Cardiovascular:  Negative.   Gastrointestinal: Negative.   Musculoskeletal: Negative.   Skin: Negative.   Neurological: Positive for sensory change.  Psychiatric/Behavioral: Negative.     Blood pressure 131/66, pulse (!) 111, temperature 98.5 F (36.9 C), temperature source Oral, resp. rate 20, height _0  (1.499 m), weight 90.4 kg (199 lb 3.2 oz), SpO2 93 %.Body mass index is 40.23 kg/m.  General Appearance: Disheveled  Eye Contact:  None  Speech:  Blocked  Volume:  Decreased  Mood:  Anxious and Irritable  Affect:  Congruent  Thought Process:  Disorganized  Orientation:  Full (Time, Place, and Person)  Thought Content:  Tangential  Suicidal Thoughts:  No  Homicidal Thoughts:  No  Memory:  Immediate;   Fair Recent;   Fair Remote;   Fair  Judgement:  Fair  Insight:  Fair  Psychomotor Activity:  Restlessness  Concentration:  Concentration: Poor  Recall:  [employer] of Knowledge:  Fair  Language:  Fair  Akathisia:  No  Handed:  Right  AIMS (if indicated):     Assets:  Desire for Improvement Housing Resilience  ADL's:  Impaired  Cognition:  Impaired,  Mild  Sleep:        Treatment Plan Summary: Daily contact with patient to assess and evaluate symptoms and progress in treatment, Medication management and Plan 77 year old woman who seems to be in a great deal of discomfort from her restless legs.  Last night she was given Compazine for nausea and it is possible that that could have made it worse.  It sounds like things have gotten a little better today since she was given Ativan but is still bothering her pretty bad.  She has been continued on her outpatient Mirapex.  The total amount of pain medicine she is getting however is less than what she has normally been taking at home which may be contributing to her discomfort.  I have changed her oxycodone to make it 15 mg every 4 hours as a standing thing to try to more or less replicate what she takes at home.  Additionally I have given her a  one-time dose of Mirapex now in addition to what she takes at night.  I have increased her gabapentin to 400 mg rather than 300 mg and I have discontinued the as needed Compazine.  Additionally I have ordered Ativan that can be done 1 mg IV every 4 hours as needed for agitation and anxiety.  Hopefully all of this will contribute to making her more comfortable.  The consult was phrased as "opiate withdrawal".  I do not think that I see any obvious evidence that the patient is abusing her opiates but she is prescribed large doses of them outpatient.  This would not be a good time to try and change that.  I would try and just keep her comfortable for now until she stabilizes from the surgery.  I will follow-up as needed.  Disposition: No  evidence of imminent risk to self or others at present.   Patient does not meet criteria for psychiatric inpatient admission. Supportive therapy provided about ongoing stressors.  Alethia Berthold, MD 08/21/2017 5:20 PM

## 2017-08-21 NOTE — Progress Notes (Signed)
[facility] - [place] at [facility]   PATIENT NAME: Brenda Glenn    MR#:  [id number]  DATE OF BIRTH:  10/18/1940  SUBJECTIVE:  CHIEF COMPLAINT:   Chief Complaint  Patient presents with  . Urinary Retention  . Multiple Complaints   Patient had I&D of the abscess.  Doing better today.  Anxious. Daughter Brenda Glenn at  bedside REVIEW OF SYSTEMS:    Review of Systems  Constitutional: Positive for malaise/fatigue. Negative for chills and fever.  HENT: Negative for sore throat.   Eyes: Negative for blurred vision, double vision and pain.  Respiratory: Negative for cough, hemoptysis, shortness of breath and wheezing.   Cardiovascular: Negative for chest pain, palpitations, orthopnea and leg swelling.  Gastrointestinal: Negative for abdominal pain, constipation, diarrhea, heartburn, nausea and vomiting.  Genitourinary: Negative for dysuria and hematuria.       Pain from perianal horseshoe-shaped abscess  Musculoskeletal: Positive for back pain. Negative for joint pain.  Skin: Negative for rash.  Neurological: Negative for sensory change, speech change, focal weakness and headaches.  Endo/Heme/Allergies: Does not bruise/bleed easily.  Psychiatric/Behavioral: Negative for depression. The patient is not nervous/anxious.   Pain, hemorrhoids, muscle cramps  DRUG ALLERGIES:   Allergies  Allergen Reactions  . Tequin [Gatifloxacin] Shortness Of Breath  . Bupropion     Other reaction(s): Other (See Comments) shaking  . Keflex [Cephalexin] Other (See Comments)    Unknown  . Lisinopril Other (See Comments)    Unknown  . Varenicline Nausea Only and Nausea And Vomiting    VITALS:  Blood pressure (!) 143/78, pulse 94, temperature 98.5 F (36.9 C), temperature source Oral, resp. rate 20, height 4\' 11"  (1.499 m), weight 90.4 kg (199 lb 3.2 oz), SpO2 95 %.  PHYSICAL EXAMINATION:   Physical Exam  GENERAL:  77 y.o.-year-old patient lying in the bed. obese  EYES: Pupils equal,  round, reactive to light and accommodation. No scleral icterus. Extraocular muscles intact.  HEENT: Head atraumatic, normocephalic. Oropharynx and nasopharynx clear.  NECK:  Supple, no jugular venous distention. No thyroid enlargement, no tenderness.  LUNGS: Normal breath sounds bilaterally, no wheezing, rales, rhonchi. No use of accessory muscles of respiration.  CARDIOVASCULAR: S1, S2 normal. No murmurs, rubs, or gallops.  ABDOMEN: Soft, nontender, nondistended. Bowel sounds present.  External hemorrhoids, perianal abscess tender to touch EXTREMITIES: No cyanosis, clubbing or edema b/l.    NEUROLOGIC: Moves all 4 extremities PSYCHIATRIC: The patient is alert and awake SKIN: No obvious rash, lesion, or ulcer.   LABORATORY PANEL:   CBC Recent Labs  Lab 08/18/17 0625  WBC 14.7*  HGB 10.6*  HCT 33.1*  PLT 238   ------------------------------------------------------------------------------------------------------------------ Chemistries  Recent Labs  Lab 08/17/17 0350  08/20/17 0453 08/21/17 0547  NA 138   < > 140 139  K 4.0   < > 4.6 5.3*  CL 108   < > 110 107  CO2 22   < > 21* 21*  GLUCOSE 99   < > 72 95  BUN 34*   < > 16 14  CREATININE 1.32*   < > 0.95 1.05*  CALCIUM 5.6*   < > 5.9* 6.4*  MG  --   --  1.9  --   AST 41  --   --   --   ALT 29  --   --   --   ALKPHOS 292*  --   --   --   BILITOT 0.7  --   --   --    < > =  values in this interval not displayed.   ------------------------------------------------------------------------------------------------------------------  Cardiac Enzymes Recent Labs  Lab 08/15/17 1411  TROPONINI 0.04*   ------------------------------------------------------------------------------------------------------------------  RADIOLOGY:  No results found.   ASSESSMENT AND PLAN:   Patient is 77 year old white female with COPD chronic respiratory failure with sepsis  *Perianal pain secondary to peri-and horseshoe abscess Status  post I&D of the abscess on June 21  Wound culture from the abscess with gram-positive cocci on IV Zosyn  appreciate surgery Dr.Pabone recommendations   * Septic shock due to UTI Septic shock resolved Urine culture with greater than 100,000 colonies of Proteus mirabilis sensitive to p.o. ciprofloxacin, cefazolin, and ceftriaxone change antibiotics to p.o. Ciprofloxacin Blood cultures with coagulase-negative Staphylococcus, no antibiotic treatments are needed at this time Changed from meropenem to IV ancef , changed to p.o. ciprofloxacin 08/19/2017 WBC trending down  *Hyperkalemia Kayexalate and repeat numbers in a.m.  *Hypocalcemia with muscle cramps  calcium gluconate 2 g IV given twice Albumin at 1.9 and corrected calcium 7.6, will provide calcium supplements by mouth  *Chronic kidney disease seems to be at  baseline, creatinine at 0.95 today   * Diabetes type 2 placed on sliding scale insulin  * COPD continue oxygen and inhalers as doing at home  * Hyperlipidemia continue home regimen  *Disposition: PT consult - snf ;patient , husband and daughter prefers patient going to rehab center.   All the records are reviewed and case discussed with Care Management/Social Worker Management plans discussed with the patient, daughter at bedside and they are in agreement.  CODE STATUS: FULL CODE  DVT Prophylaxis: SCDs  TOTAL TIME TAKING CARE OF THIS PATIENT: 35 minutes.   POSSIBLE D/C IN 1 DAYS, DEPENDING ON CLINICAL CONDITION.  Ramonita Lab M.D on 08/21/2017 at 4:33 PM  Between 7am to 6pm - Pager - [phone]  After 6pm go to [email or web address] - password [email or web address] [facility]  [facility] [place] [facility]  Office  [phone]  CC: Primary care physician; Mickey Farber, MD  Note: This dictation was prepared with Dragon dictation along with smaller phrase technology. Any transcriptional errors that result from this process are unintentional.

## 2017-08-21 NOTE — Progress Notes (Signed)
POD # 1 I/D horseshoe abscess Delirium VSS  PE NAD WOund w drains in place some serosanguineous drainage. No active bleeding, no necrotizing infection  A/P delirium likely related to medical condition Continue wound care and A/BS Hold ASA and lovenox today for oozing

## 2017-08-21 NOTE — Progress Notes (Signed)
Patient resting in bed, family at bedside. Currently calm and cooperative. Asking for pain medications, patient and family aware oxycodone is scheduled to be given at 1600, patient okay to wait. No other s/s of distress noted. Heart rated 110's. Will continue to monitor closely

## 2017-08-22 DIAGNOSIS — G2581 Restless legs syndrome: Secondary | ICD-10-CM

## 2017-08-22 LAB — GLUCOSE, CAPILLARY
GLUCOSE-CAPILLARY: 88 mg/dL (ref 65–99)
GLUCOSE-CAPILLARY: 115 mg/dL — AB (ref 65–99)
GLUCOSE-CAPILLARY: 117 mg/dL — AB (ref 65–99)
Glucose-Capillary: 103 mg/dL — ABNORMAL HIGH (ref 65–99)

## 2017-08-22 LAB — BASIC METABOLIC PANEL
ANION GAP: 6 (ref 5–15)
BUN: 12 mg/dL (ref 6–20)
CALCIUM: 6.3 mg/dL — AB (ref 8.9–10.3)
CO2: 25 mmol/L (ref 22–32)
Chloride: 110 mmol/L (ref 101–111)
Creatinine, Ser: 0.94 mg/dL (ref 0.44–1.00)
GFR calc non Af Amer: 57 mL/min — ABNORMAL LOW (ref >60)
Glucose, Bld: 89 mg/dL (ref 65–99)
Potassium: 4.5 mmol/L (ref 3.5–5.1)
Sodium: 141 mmol/L (ref 135–145)

## 2017-08-22 LAB — CBC
HCT: 28.1 % — ABNORMAL LOW (ref 35.0–47.0)
Hemoglobin: 9.2 g/dL — ABNORMAL LOW (ref 12.0–16.0)
MCH: 31.4 pg (ref 26.0–34.0)
MCHC: 32.7 g/dL (ref 32.0–36.0)
MCV: 96.1 fL (ref 80.0–100.0)
Platelets: 279 10*3/uL (ref 150–440)
RBC: 2.93 MIL/uL — AB (ref 3.80–5.20)
RDW: 18.4 % — ABNORMAL HIGH (ref 11.5–14.5)
WBC: 9.7 10*3/uL (ref 3.6–11.0)

## 2017-08-22 LAB — TRANSFERRIN: TRANSFERRIN: 98 mg/dL — AB (ref 192–382)

## 2017-08-22 MED ORDER — CALCIUM CARBONATE ANTACID 500 MG PO CHEW
1000.0000 mg | CHEWABLE_TABLET | Freq: Three times a day (TID) | ORAL | Status: DC
  Administered 2017-08-22 – 2017-08-26 (×13): 1000 mg via ORAL
  Filled 2017-08-22 (×12): qty 5

## 2017-08-22 NOTE — Consult Note (Signed)
[facility] Consult   Reason for Consult: Follow-up consult for 77 year old woman seen yesterday with agitation. Referring Physician: Sudini Patient Identification: Alla Sloma MRN:  [id number] Principal Diagnosis: Restless leg syndrome Diagnosis:   Patient Active Problem List   Diagnosis Date Noted  . Restless leg syndrome [G25.81] 08/21/2017  . Perianal abscess [K61.0]   . Sepsis ([place]) [A41.9] 08/15/2017  . Bilateral lower extremity edema [R60.0] 07/28/2017  . Lower extremity numbness [R20.0] 07/28/2017  . ARF (acute renal failure) ([place]) [N17.9] 06/20/2017  . 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 Lymphedema [I89.0] 01/06/2016  . Chronic venous insufficiency [I87.2] 01/06/2016  . Pain in limb [M79.609] 01/06/2016  . COPD (chronic obstructive pulmonary disease) ([place]) [J44.9] 01/06/2016  . Abnormality of gait [R26.9] 01/06/2016    Total Time spent with patient: 30 minutes  Subjective:   Kamarii Buren is a 77 y.o. female patient admitted with "I guess I am alright".  HPI: Patient seen for follow-up today.  See previous note.  She is a great deal better today than she was yesterday.  She has no complaints about discomfort in her legs.  She is calm and cooperative with treatment.  Patient is alert and oriented x4 and able to describe her situation appropriately.  Affect is appropriately reactive.  No evidence of psychosis or mood disorder.  Past Psychiatric History: Just treatment in the past mainly for restless legs.  Risk to Self:   Risk to Others:   Prior Inpatient Therapy:   Prior Outpatient Therapy:    Past Medical History:  Past Medical History:  Diagnosis Date  . Arthritis    all over  . Back pain   . COPD (chronic obstructive pulmonary disease) (HCC)    O2 use continuosly at 2 liters/ some  asthma symptoms  . Diabetes mellitus without complication (HCC)    diet controlled  . GERD (gastroesophageal reflux disease)   .  Heart murmur    lifelong  . Hypercholesterolemia   . Hypertension    controlled on meds  . Pneumonia 2015   [facility] hospitalized  . Renal insufficiency    early stage kidney failure  . Shortness of breath dyspnea   . Sleep apnea    2nd sleep study said pt does not have sleep apnea  . Swelling    of feet and legs    Past Surgical History:  Procedure Laterality Date  . ABDOMINAL HYSTERECTOMY  1988  . BACK SURGERY  1978 and 1995   residual nerve damage legs-weakness  . BLADDER SURGERY  2017  . CATARACT EXTRACTION Right   . CATARACT EXTRACTION W/PHACO Left 09/05/2014   Procedure: CATARACT EXTRACTION PHACO AND INTRAOCULAR LENS PLACEMENT (IOC);  Surgeon: Leandrew Koyanagi, MD;  Location: [facility];  Service: Ophthalmology;  Laterality: Left;  DIABETIC  . CHOLECYSTECTOMY  2011  . INCISION AND DRAINAGE PERIRECTAL ABSCESS N/A 08/20/2017   Procedure: IRRIGATION AND DEBRIDEMENT PERIRECTAL ABSCESS;  Surgeon: Jules Husbands, MD;  Location: [facility];  Service: General;  Laterality: N/A;  . JOINT REPLACEMENT Right 2009  . RECTAL EXAM UNDER ANESTHESIA N/A 08/20/2017   Procedure: RECTAL EXAM UNDER ANESTHESIA;  Surgeon: Jules Husbands, MD;  Location: [facility];  Service: General;  Laterality: N/A;  . REPLACEMENT TOTAL KNEE Right   . REVERSE SHOULDER ARTHROPLASTY Right 01/08/2017   Procedure: REVERSE SHOULDER ARTHROPLASTY;  Surgeon: Leim Fabry, MD;  Location: [facility];  Service: Orthopedics;  Laterality: Right;   Family History:  Family History  Problem Relation Age of Onset  . Breast cancer Mother 58  . Breast cancer Paternal Grandmother 34   Family Psychiatric  History: Unknown Social History:  Social History   Substance and Sexual Activity  Alcohol Use Yes  . Alcohol/week: 1.8 oz  . Types: 3 Glasses of wine per week   Comment: rarely     Social History   Substance and Sexual Activity  Drug Use No    Social History   Socioeconomic History  . Marital status: Married     Spouse name: Not on file  . Number of children: Not on file  . Years of education: Not on file  . Highest education level: Not on file  Occupational History  . Not on file  Social Needs  . Financial resource strain: Not on file  . Food insecurity:    Worry: Not on file    Inability: Not on file  . Transportation needs:    Medical: Not on file    Non-medical: Not on file  Tobacco Use  . Smoking status: Current Every Day Smoker    Packs/day: 0.25    Years: 50.00    Pack years: 12.50    Types: Cigarettes  . Smokeless tobacco: Never Used  Substance and Sexual Activity  . Alcohol use: Yes    Alcohol/week: 1.8 oz    Types: 3 Glasses of wine per week    Comment: rarely  . Drug use: No  . Sexual activity: Not on file  Lifestyle  . Physical activity:    Days per week: Not on file    Minutes per session: Not on file  . Stress: Not on file  Relationships  . Social connections:    Talks on phone: Not on file    Gets together: Not on file    Attends religious service: Not on file    Active member of club or organization: Not on file    Attends meetings of clubs or organizations: Not on file    Relationship status: Not on file  Other Topics Concern  . Not on file  Social History Narrative  . Not on file   Additional Social History:    Allergies:   Allergies  Allergen Reactions  . Tequin [Gatifloxacin] Shortness Of Breath  . Bupropion     Other reaction(s): Other (See Comments) shaking  . Keflex [Cephalexin] Other (See Comments)    Unknown  . Lisinopril Other (See Comments)    Unknown  . Varenicline Nausea Only and Nausea And Vomiting    Labs:  Results for orders placed or performed during the hospital encounter of 08/15/17 (from the past 48 hour(s))  Aerobic/Anaerobic Culture (surgical/deep wound)     Status: None (Preliminary result)   Collection Time: 08/20/17  4:22 PM  Result Value Ref Range   Specimen Description      ABSCESS PERIRECTAL Performed at [facility], [address], [place], [place] [address]    Special Requests PENDING    Gram Stain NO WBC SEEN FEW GRAM POSITIVE COCCI     Culture      ABUNDANT STAPHYLOCOCCUS AUREUS NO ANAEROBES ISOLATED; CULTURE IN PROGRESS FOR 5 DAYS    Report Status PENDING   Glucose, capillary     Status: None   Collection Time: 08/20/17  5:05 PM  Result Value Ref Range   Glucose-Capillary 78 65 - 99 mg/dL  Glucose, capillary     Status: None   Collection Time: 08/20/17  6:39  PM  Result Value Ref Range   Glucose-Capillary 85 65 - 99 mg/dL  Glucose, capillary     Status: None   Collection Time: 08/20/17  8:59 PM  Result Value Ref Range   Glucose-Capillary 95 65 - 99 mg/dL   Comment 1 Notify RN   Basic metabolic panel     Status: Abnormal   Collection Time: 08/21/17  5:47 AM  Result Value Ref Range   Sodium 139 135 - 145 mmol/L   Potassium 5.3 (H) 3.5 - 5.1 mmol/L   Chloride 107 101 - 111 mmol/L   CO2 21 (L) 22 - 32 mmol/L   Glucose, Bld 95 65 - 99 mg/dL   BUN 14 6 - 20 mg/dL   Creatinine, Ser 1.05 (H) 0.44 - 1.00 mg/dL   Calcium 6.4 (LL) 8.9 - 10.3 mg/dL    Comment: CRITICAL RESULT CALLED TO, READ BACK BY AND VERIFIED WITH ELAINA HODGES 08/21/17 @ 0735  MLK    GFR calc non Af Amer 50 (L) >60 mL/min   GFR calc Af Amer 58 (L) >60 mL/min    Comment: (NOTE) The eGFR has been calculated using the CKD EPI equation. This calculation has not been validated in all clinical situations. eGFR's persistently <60 mL/min signify possible Chronic Kidney Disease.    Anion gap 11 5 - 15    Comment: Performed at [facility], [address] [place], [place] [address]  Albumin     Status: Abnormal   Collection Time: 08/21/17  5:47 AM  Result Value Ref Range   Albumin 2.2 (L) 3.5 - 5.0 g/dL    Comment: Performed at [facility], [address] [place], [place] [address]  Glucose, capillary     Status: None   Collection Time: 08/21/17  8:31 AM  Result Value Ref Range    Glucose-Capillary 84 65 - 99 mg/dL  Glucose, capillary     Status: Abnormal   Collection Time: 08/21/17 12:21 PM  Result Value Ref Range   Glucose-Capillary 114 (H) 65 - 99 mg/dL  Glucose, capillary     Status: None   Collection Time: 08/21/17  4:43 PM  Result Value Ref Range   Glucose-Capillary 88 65 - 99 mg/dL  Ferritin     Status: None   Collection Time: 08/21/17  5:55 PM  Result Value Ref Range   Ferritin 139 11 - 307 ng/mL    Comment: Performed at [facility], [address] [place], [place] [address]  Transferrin     Status: Abnormal   Collection Time: 08/21/17  5:55 PM  Result Value Ref Range   Transferrin 98 (L) 192 - 382 mg/dL    Comment: Performed at [facility], [place] [address] [place], [place] [address]  Glucose, capillary     Status: None   Collection Time: 08/21/17  9:05 PM  Result Value Ref Range   Glucose-Capillary 98 65 - 99 mg/dL  Basic metabolic panel     Status: Abnormal   Collection Time: 08/22/17  4:04 AM  Result Value Ref Range   Sodium 141 135 - 145 mmol/L   Potassium 4.5 3.5 - 5.1 mmol/L   Chloride 110 101 - 111 mmol/L   CO2 25 22 - 32 mmol/L   Glucose, Bld 89 65 - 99 mg/dL   BUN 12 6 - 20 mg/dL   Creatinine, Ser 0.94 0.44 - 1.00 mg/dL   Calcium 6.3 (LL) 8.9 - 10.3 mg/dL    Comment: CRITICAL  RESULT CALLED TO, READ BACK BY AND VERIFIED WITH MICHELLE ROGERS AT 0609 ON 08/22/17 RWW    GFR calc non Af Amer 57 (L) >60 mL/min   GFR calc Af Amer >60 >60 mL/min    Comment: (NOTE) The eGFR has been calculated using the CKD EPI equation. This calculation has not been validated in all clinical situations. eGFR's persistently <60 mL/min signify possible Chronic Kidney Disease.    Anion gap 6 5 - 15    Comment: Performed at [facility], [address] [place], [place] [address]  CBC     Status: Abnormal   Collection Time: 08/22/17  4:04 AM  Result Value Ref Range   WBC 9.7 3.6 - 11.0 K/uL   RBC 2.93 (L) 3.80 - 5.20 MIL/uL    Hemoglobin 9.2 (L) 12.0 - 16.0 g/dL   HCT 28.1 (L) 35.0 - 47.0 %   MCV 96.1 80.0 - 100.0 fL   MCH 31.4 26.0 - 34.0 pg   MCHC 32.7 32.0 - 36.0 g/dL   RDW 18.4 (H) 11.5 - 14.5 %   Platelets 279 150 - 440 K/uL    Comment: Performed at [facility], [address] [place], [place] [address]  Glucose, capillary     Status: None   Collection Time: 08/22/17  8:09 AM  Result Value Ref Range   Glucose-Capillary 88 65 - 99 mg/dL   Comment 1 Notify RN   Glucose, capillary     Status: Abnormal   Collection Time: 08/22/17 11:30 AM  Result Value Ref Range   Glucose-Capillary 115 (H) 65 - 99 mg/dL   Comment 1 Notify RN     Current Facility-Administered Medications  Medication Dose Route Frequency Provider Last Rate Last Dose  . acetaminophen (TYLENOL) tablet 650 mg  650 mg Oral Q6H PRN Jules Husbands, MD   650 mg at 08/17/17 2146  . allopurinol (ZYLOPRIM) tablet 200 mg  200 mg Oral Daily Pabon, [place] F, MD   200 mg at 08/22/17 0943  . calcium carbonate (TUMS - dosed in mg elemental calcium) chewable tablet 1,000 mg  1,000 mg Oral TID WC Hillary Bow, MD   1,000 mg at 08/22/17 0941  . diclofenac (FLECTOR) 1.3 % 1 patch  1 patch Transdermal Daily PRN Pabon, Diego F, MD      . docusate sodium (COLACE) capsule 100 mg  100 mg Oral BID Caroleen Hamman F, MD   100 mg at 08/22/17 0942  . ezetimibe (ZETIA) tablet 10 mg  10 mg Oral QHS Pabon, [place] F, MD   10 mg at 08/21/17 2302  . folic acid (FOLVITE) tablet 1 mg  1 mg Oral Daily Gouru, Aruna, MD   1 mg at 08/22/17 0943  . gabapentin (NEURONTIN) capsule 400 mg  400 mg Oral TID Anselm Aumiller, Madie Reno, MD   400 mg at 08/22/17 0942  . guaiFENesin (MUCINEX) 12 hr tablet 600 mg  600 mg Oral Q12H PRN Pabon, Diego F, MD      . insulin aspart (novoLOG) injection 0-15 Units  0-15 Units Subcutaneous TID WC Jules Husbands, MD   2 Units at 08/18/17 1144  . insulin aspart (novoLOG) injection 0-5 Units  0-5 Units Subcutaneous QHS Pabon, Diego F, MD      .  ipratropium-albuterol (DUONEB) 0.5-2.5 (3) MG/3ML nebulizer solution 3 mL  3 mL Nebulization Q6H PRN Pabon, Diego F, MD      . LORazepam (ATIVAN) injection 1 mg  1 mg Intravenous Q4H  PRN Barrett Goldie, Madie Reno, MD      . LORazepam (ATIVAN) tablet 0.5 mg  0.5 mg Oral Q6H PRN Gouru, Aruna, MD   0.5 mg at 08/22/17 0056  . metoprolol succinate (TOPROL-XL) 24 hr tablet 100 mg  100 mg Oral Daily Pabon, [place] F, MD   100 mg at 08/22/17 0943  . mometasone-formoterol (DULERA) 200-5 MCG/ACT inhaler 2 puff  2 puff Inhalation BID Jules Husbands, MD   2 puff at 08/22/17 0943  . multivitamin with minerals tablet 1 tablet  1 tablet Oral Daily Gouru, Aruna, MD   1 tablet at 08/22/17 0942  . nicotine (NICODERM CQ - dosed in mg/24 hours) patch 14 mg  14 mg Transdermal Daily Pabon, [place] F, MD   14 mg at 08/22/17 0948  . ondansetron (ZOFRAN) injection 4 mg  4 mg Intravenous Q6H PRN Caroleen Hamman F, MD   4 mg at 08/20/17 2305  . oxyCODONE (Oxy IR/ROXICODONE) immediate release tablet 15 mg  15 mg Oral Q4H Lizbett Garciagarcia, Madie Reno, MD   15 mg at 08/22/17 0942  . oxyCODONE (Oxy IR/ROXICODONE) immediate release tablet 5 mg  5 mg Oral Q6H PRN Jules Husbands, MD   5 mg at 08/21/17 0156  . pantoprazole (PROTONIX) EC tablet 40 mg  40 mg Oral QAC breakfast Pabon, [place] F, MD   40 mg at 08/22/17 0942  . piperacillin-tazobactam (ZOSYN) IVPB 3.375 g  3.375 g Intravenous Q8H Jules Husbands, MD   Stopped at 08/22/17 1109  . polyethylene glycol (MIRALAX / GLYCOLAX) packet 17 g  17 g Oral Daily PRN Caroleen Hamman F, MD   17 g at 08/18/17 0601  . pramipexole (MIRAPEX) tablet 1 mg  1 mg Oral QHS Pabon, [place] F, MD   1 mg at 08/21/17 2302  . theophylline (UNIPHYL) 400 MG 24 hr tablet 400 mg  400 mg Oral Daily Pabon, [place] F, MD   400 mg at 08/22/17 0943  . thiamine (VITAMIN B-1) tablet 100 mg  100 mg Oral Daily Gouru, Aruna, MD   100 mg at 08/22/17 4259   Or  . thiamine (B-1) injection 100 mg  100 mg Intravenous Daily Gouru, Aruna, MD      . Vitamin D  (Ergocalciferol) (DRISDOL) capsule 50,000 Units  50,000 Units Oral Q14 Days Pabon, Marjory Lies, MD        Musculoskeletal: Strength & Muscle Tone: decreased Gait & Station: unsteady Patient leans: N/A  Psychiatric Specialty Exam: Physical Exam  Nursing note and vitals reviewed. Constitutional: She appears well-developed and well-nourished.  HENT:  Head: Normocephalic and atraumatic.  Eyes: Pupils are equal, round, and reactive to light. Conjunctivae are normal.  Neck: Normal range of motion.  Cardiovascular: Normal heart sounds.  Respiratory: Effort normal.  GI: Soft.  Musculoskeletal: Normal range of motion.  Neurological: She is alert.  Skin: Skin is warm and dry.  Psychiatric: Her speech is normal and behavior is normal. Judgment and thought content normal. Her affect is blunt. Cognition and memory are normal.    Review of Systems  Constitutional: Negative.   HENT: Negative.   Eyes: Negative.   Respiratory: Negative.   Cardiovascular: Negative.   Gastrointestinal: Negative.   Musculoskeletal: Negative.   Skin: Negative.   Neurological: Negative.   Psychiatric/Behavioral: Negative.     Blood pressure 116/65, pulse 97, temperature 97.7 F (36.5 C), temperature source Oral, resp. rate 18, height 4' 11"  (1.499 m), weight 90.4 kg (199 lb 3.2 oz), SpO2 95 %.Body mass  index is 40.23 kg/m.  General Appearance: Casual  Eye Contact:  Fair  Speech:  Clear and Coherent  Volume:  Normal  Mood:  Euthymic  Affect:  Congruent  Thought Process:  Goal Directed  Orientation:  Full (Time, Place, and Person)  Thought Content:  Logical  Suicidal Thoughts:  No  Homicidal Thoughts:  No  Memory:  Immediate;   Fair Recent;   Fair Remote;   Fair  Judgement:  Fair  Insight:  Fair  Psychomotor Activity:  Decreased  Concentration:  Concentration: Fair  Recall:  [employer] of Knowledge:  Fair  Language:  Fair  Akathisia:  No  Handed:  Right  AIMS (if indicated):     Assets:  Desire for  Improvement Financial Resources/Insurance Resilience Social Support  ADL's:  Intact  Cognition:  WNL  Sleep:        Treatment Plan Summary: Medication management and Plan Patient appears to be back pretty much to her baseline.  No evidence of psychosis.  I discussed briefly with her her narcotic pain use.  I reminded her that yesterday she had mentioned that she herself believe that may be she should cut down on her pain medicine.  I told her that I did not think that she needed to do that acutely in the hospital while having surgery but I suggested that she hold onto that thought and talk with her primary care doctor or pain management specialist about possibly coming down off some of the pain medicine gradually in the future.  No need for other intervention and I will sign off at this point.  Disposition: No evidence of imminent risk to self or others at present.   Patient does not meet criteria for psychiatric inpatient admission. Supportive therapy provided about ongoing stressors.  Alethia Berthold, MD 08/22/2017 2:16 PM

## 2017-08-22 NOTE — Progress Notes (Signed)
[employer] - [place] at [facility]   PATIENT NAME: Brenda Glenn    MR#:  [id number]  DATE OF BIRTH:  31-Oct-1940  SUBJECTIVE:  CHIEF COMPLAINT:   Chief Complaint  Patient presents with  . Urinary Retention  . Multiple Complaints   pain is improved. REVIEW OF SYSTEMS:    Review of Systems  Constitutional: Positive for malaise/fatigue. Negative for chills and fever.  HENT: Negative for sore throat.   Eyes: Negative for blurred vision, double vision and pain.  Respiratory: Negative for cough, hemoptysis, shortness of breath and wheezing.   Cardiovascular: Negative for chest pain, palpitations, orthopnea and leg swelling.  Gastrointestinal: Negative for abdominal pain, constipation, diarrhea, heartburn, nausea and vomiting.  Genitourinary: Negative for dysuria and hematuria.       Perianal pain  Musculoskeletal: Positive for back pain. Negative for joint pain.  Skin: Negative for rash.  Neurological: Negative for sensory change, speech change, focal weakness and headaches.  Endo/Heme/Allergies: Does not bruise/bleed easily.  Psychiatric/Behavioral: Negative for depression. The patient is not nervous/anxious.   Pain, hemorrhoids, muscle cramps  DRUG ALLERGIES:   Allergies  Allergen Reactions  . Tequin [Gatifloxacin] Shortness Of Breath  . Bupropion     Other reaction(s): Other (See Comments) shaking  . Keflex [Cephalexin] Other (See Comments)    Unknown  . Lisinopril Other (See Comments)    Unknown  . Varenicline Nausea Only and Nausea And Vomiting    VITALS:  Blood pressure 116/65, pulse 97, temperature 97.7 F (36.5 C), temperature source Oral, resp. rate 18, height 4\' 11"  (1.499 m), weight 90.4 kg (199 lb 3.2 oz), SpO2 95 %.  PHYSICAL EXAMINATION:   Physical Exam  GENERAL:  77 y.o.-year-old patient lying in the bed. obese  EYES: Pupils equal, round, reactive to light and accommodation. No scleral icterus. Extraocular muscles intact.  HEENT: Head  atraumatic, normocephalic. Oropharynx and nasopharynx clear.  NECK:  Supple, no jugular venous distention. No thyroid enlargement, no tenderness.  LUNGS: Normal breath sounds bilaterally, no wheezing, rales, rhonchi. No use of accessory muscles of respiration.  CARDIOVASCULAR: S1, S2 normal. No murmurs, rubs, or gallops.  ABDOMEN: Soft, nontender, nondistended. Bowel sounds present.  External hemorrhoids, perianal abscess tender to touch EXTREMITIES: No cyanosis, clubbing or edema b/l.    NEUROLOGIC: Moves all 4 extremities PSYCHIATRIC: The patient is alert and awake SKIN: No obvious rash, lesion, or ulcer.   LABORATORY PANEL:   CBC Recent Labs  Lab 08/22/17 0404  WBC 9.7  HGB 9.2*  HCT 28.1*  PLT 279   ------------------------------------------------------------------------------------------------------------------ Chemistries  Recent Labs  Lab 08/17/17 0350  08/20/17 0453  08/22/17 0404  NA 138   < > 140   < > 141  K 4.0   < > 4.6   < > 4.5  CL 108   < > 110   < > 110  CO2 22   < > 21*   < > 25  GLUCOSE 99   < > 72   < > 89  BUN 34*   < > 16   < > 12  CREATININE 1.32*   < > 0.95   < > 0.94  CALCIUM 5.6*   < > 5.9*   < > 6.3*  MG  --   --  1.9  --   --   AST 41  --   --   --   --   ALT 29  --   --   --   --  ALKPHOS 292*  --   --   --   --   BILITOT 0.7  --   --   --   --    < > = values in this interval not displayed.   ------------------------------------------------------------------------------------------------------------------  Cardiac Enzymes Recent Labs  Lab 08/15/17 1411  TROPONINI 0.04*   ------------------------------------------------------------------------------------------------------------------  RADIOLOGY:  No results found.   ASSESSMENT AND PLAN:   Patient is 77 year old white female with COPD chronic respiratory failure with sepsis  * Peri anal horseshoe abscess Status post I&D of the abscess on June 21  Wound culture from the  abscess with gram-positive cocci on IV Zosyn  appreciate surgery input waiting for final cultures  * Septic shock due to UTI Septic shock resolved Urine culture -Proteus Blood cultures with coagulase-negative Staphylococcus, likely contaminant Changed from meropenem to IV ancef , changed to p.o. ciprofloxacin 08/19/2017  *Hyperkalemia  resolved.  *Hypocalcemia with muscle cramps also has associated hyper album anemia. Replace calcium.  *Chronic kidney disease 3 at baseline  * Diabetes type 2 placed on sliding scale insulin  * COPD continue oxygen and inhalers as doing at home  * Hyperlipidemia continue home regimen  Skilled nursing facility at discharge   All the records are reviewed and case discussed with Care Management/Social Worker Management plans discussed with the patient, daughter at bedside and they are in agreement.  CODE STATUS: FULL CODE  DVT Prophylaxis: SCDs  TOTAL TIME TAKING CARE OF THIS PATIENT: 35 minutes.   POSSIBLE D/C IN 1 DAYS, DEPENDING ON CLINICAL CONDITION.  Orie FishermanSrikar R Ruxin Ransome M.D on 08/22/2017 at 9:18 AM  Between 7am to 6pm - Pager - [phone]  After 6pm go to [email or web address] - password [email or web address] [facility]  [facility] [place] [facility]  Office  [phone]  CC: Primary care physician; Mickey Farberhies, David, MD  Note: This dictation was prepared with Dragon dictation along with smaller phrase technology. Any transcriptional errors that result from this process are unintentional.

## 2017-08-22 NOTE — Plan of Care (Signed)
  Problem: Pain Managment: Goal: General experience of comfort will improve Outcome: Progressing   Problem: Skin Integrity: Goal: Risk for impaired skin integrity will decrease Outcome: Progressing   

## 2017-08-22 NOTE — Progress Notes (Signed)
S/p I/D horseshoe abscess Delirium is main issue Hb stable  PE NAD, calm Wound healing well w penrose's in place. No further un drained collections  A./P Doing well May transition to PO Flagyl and Cipro and continue for 10 more days Continue drains for now I will see her back next week No further surgical intervention Change pads TID or QID We will be available

## 2017-08-22 NOTE — Progress Notes (Signed)
Pharmacy Electrolyte Monitoring Consult:  Pharmacy consulted to assist in monitoring and replacing electrolytes in this 77 y.o. female admitted on 08/15/2017 with Urinary Retention and Multiple Complaints   Labs:  Sodium (mmol/L)  Date Value  08/22/2017 141  04/03/2013 132 (L)   Potassium (mmol/L)  Date Value  08/22/2017 4.5  04/03/2013 3.3 (L)   Magnesium (mg/dL)  Date Value  96/04/540906/21/2019 1.9   Calcium (mg/dL)  Date Value  81/19/147806/23/2019 6.3 (LL)   Calcium, Total (mg/dL)  Date Value  29/56/213002/04/2013 8.6   Albumin (g/dL)  Date Value  86/57/846906/22/2019 2.2 (L)  02/20/2013 3.6   CCa: 7.74 mg/dL  Assessment/Plan: Will order Tums 1000mg  PO tid with meals. Patient is on vitamin D 6295250000 units every 14 days with no Ca supplementation.  Will f/u AM labs.   Brenda Glenn, PharmD, BCPS 08/22/2017 3:38 PM

## 2017-08-23 LAB — GLUCOSE, CAPILLARY
GLUCOSE-CAPILLARY: 83 mg/dL (ref 65–99)
GLUCOSE-CAPILLARY: 128 mg/dL — AB (ref 65–99)
GLUCOSE-CAPILLARY: 147 mg/dL — AB (ref 65–99)
Glucose-Capillary: 110 mg/dL — ABNORMAL HIGH (ref 65–99)

## 2017-08-23 LAB — BASIC METABOLIC PANEL
Anion gap: 7 (ref 5–15)
BUN: 13 mg/dL (ref 6–20)
CALCIUM: 6.1 mg/dL — AB (ref 8.9–10.3)
CO2: 25 mmol/L (ref 22–32)
Chloride: 108 mmol/L (ref 101–111)
Creatinine, Ser: 0.86 mg/dL (ref 0.44–1.00)
GLUCOSE: 89 mg/dL (ref 65–99)
Potassium: 4.2 mmol/L (ref 3.5–5.1)
SODIUM: 140 mmol/L (ref 135–145)

## 2017-08-23 MED ORDER — CEFAZOLIN SODIUM-DEXTROSE 1-4 GM/50ML-% IV SOLN
1.0000 g | Freq: Three times a day (TID) | INTRAVENOUS | Status: DC
  Administered 2017-08-23 – 2017-08-24 (×3): 1 g via INTRAVENOUS
  Filled 2017-08-23 (×5): qty 50

## 2017-08-23 MED ORDER — VANCOMYCIN HCL IN DEXTROSE 1-5 GM/200ML-% IV SOLN
1000.0000 mg | Freq: Once | INTRAVENOUS | Status: AC
  Administered 2017-08-23: 1000 mg via INTRAVENOUS
  Filled 2017-08-23: qty 200

## 2017-08-23 MED ORDER — ENOXAPARIN SODIUM 40 MG/0.4ML ~~LOC~~ SOLN
40.0000 mg | SUBCUTANEOUS | Status: DC
  Administered 2017-08-23 – 2017-08-25 (×3): 40 mg via SUBCUTANEOUS
  Filled 2017-08-23 (×3): qty 0.4

## 2017-08-23 MED ORDER — VANCOMYCIN HCL IN DEXTROSE 1-5 GM/200ML-% IV SOLN
1000.0000 mg | INTRAVENOUS | Status: DC
Start: 2017-08-24 — End: 2017-08-26
  Administered 2017-08-23 – 2017-08-26 (×3): 1000 mg via INTRAVENOUS
  Filled 2017-08-23 (×4): qty 200

## 2017-08-23 MED ORDER — SENNOSIDES-DOCUSATE SODIUM 8.6-50 MG PO TABS
2.0000 | ORAL_TABLET | Freq: Two times a day (BID) | ORAL | Status: DC
  Administered 2017-08-23 – 2017-08-24 (×4): 2 via ORAL
  Filled 2017-08-23 (×6): qty 2

## 2017-08-23 MED ORDER — OXYCODONE HCL 5 MG PO TABS
5.0000 mg | ORAL_TABLET | Freq: Two times a day (BID) | ORAL | Status: DC | PRN
  Administered 2017-08-25: 5 mg via ORAL
  Filled 2017-08-23: qty 1

## 2017-08-23 MED ORDER — OXYCODONE HCL 5 MG PO TABS
15.0000 mg | ORAL_TABLET | Freq: Four times a day (QID) | ORAL | Status: DC
  Administered 2017-08-24 – 2017-08-26 (×6): 15 mg via ORAL
  Filled 2017-08-23 (×9): qty 3

## 2017-08-23 NOTE — Care Management (Signed)
CM was informed by attending that family had changed their minds about SNF and now wish to return home with home health.  Initial PT eval 6/20 and patient was not able to take any steps. Patient has not been available or  able to participate with therapy since.  CM spoke with patient's daughters and husband.  They relayed that attending verbalized concern about risk of infection at another facility vs home.  CM acknowledged this was a valid concern.  All family members verbalize concern of having the ability to meet the physical demands of patient's decline in functional status.   They want patient to get better and strengthened. Patient's husband verbalizes he would want to proceed with skilled nursing facility.   CM discussed that patient would get more therapy at facility vs home health at home where patient and family members would be responsible to carry out the home exercise program daily on days the therapists does not visit. At present, they discuss that patient is too weak and they fear she will fall and injure herself and or caregivers. Discussed that if patient does not participate or progress in a facility, insurance would cease to pay for continued stay.  Discussed that palliative can follow at a facility- or at home- for sx management/ pain management. Family feel that there is a dependence on chronic pain meds that is affecting patient's decline in functional status.  Family stated that "it is not set in stone" that would only accept a bed [facility].  CM discussed that on the day of discharge when it has been determined patient is medically stable - if declines bed offer for a skilled facility, then would proceed to discharge home.  If patient does not discharge to skilled facility- patient will need home health RN PT OT Aide SW, Hospital bed and hoyer lift.  Would need ems transport home.  Family has questions regarding foley cath. Informed it is for urinary retention. Unable to determine if  bladder training has been attempted or if is planned for patient to discharge with it in place.

## 2017-08-23 NOTE — Plan of Care (Signed)
  Problem: Clinical Measurements: Goal: Respiratory complications will improve Outcome: Progressing   Problem: Pain Managment: Goal: General experience of comfort will improve Outcome: Progressing   Problem: Skin Integrity: Goal: Risk for impaired skin integrity will decrease Outcome: Progressing   Problem: Clinical Measurements: Goal: Diagnostic test results will improve Outcome: Not Progressing

## 2017-08-23 NOTE — Plan of Care (Signed)
  Problem: Clinical Measurements: Goal: Will remain free from infection Outcome: Progressing Goal: Diagnostic test results will improve Outcome: Progressing   Problem: Pain Managment: Goal: General experience of comfort will improve Outcome: Progressing   Problem: Safety: Goal: Ability to remain free from injury will improve Outcome: Progressing   Problem: Skin Integrity: Goal: Risk for impaired skin integrity will decrease Outcome: Progressing   

## 2017-08-23 NOTE — Progress Notes (Signed)
I've had a talk with patient and family. They're in agreement with HHPT, RN.  Overall poor prognosis. She has poor functional status.  Doesn't walk much at home (may be some with walker), and same here (wouldn't ambulate much with PT) - doubt her ability to work with daily PT.   I agree with HHPT, RN and support Palliative care at home if she qualifies.

## 2017-08-23 NOTE — Progress Notes (Signed)
[employer] - [place] at [facility]   PATIENT NAME: Brenda Glenn    MR#:  [id number]  DATE OF BIRTH:  01/15/1941  SUBJECTIVE:  CHIEF COMPLAINT:   Chief Complaint  Patient presents with  . Urinary Retention  . Multiple Complaints  weeping edema on rt forearm, not able to participate much with therapy, on significant dose of narcotics (family concerned and requesting new PCP at D/C) REVIEW OF SYSTEMS:    Review of Systems  Constitutional: Positive for malaise/fatigue. Negative for chills and fever.  HENT: Negative for sore throat.   Eyes: Negative for blurred vision, double vision and pain.  Respiratory: Negative for cough, hemoptysis, shortness of breath and wheezing.   Cardiovascular: Negative for chest pain, palpitations, orthopnea and leg swelling.  Gastrointestinal: Negative for abdominal pain, constipation, diarrhea, heartburn, nausea and vomiting.  Genitourinary: Negative for dysuria and hematuria.       Perianal pain  Musculoskeletal: Positive for back pain. Negative for joint pain.  Skin: Negative for rash.  Neurological: Negative for sensory change, speech change, focal weakness and headaches.  Endo/Heme/Allergies: Does not bruise/bleed easily.  Psychiatric/Behavioral: Negative for depression. The patient is not nervous/anxious.   Pain, hemorrhoids, muscle cramps  DRUG ALLERGIES:   Allergies  Allergen Reactions  . Tequin [Gatifloxacin] Shortness Of Breath  . Bupropion     Other reaction(s): Other (See Comments) shaking  . Keflex [Cephalexin] Other (See Comments)    Unknown  . Lisinopril Other (See Comments)    Unknown  . Varenicline Nausea Only and Nausea And Vomiting    VITALS:  Blood pressure 108/63, pulse 78, temperature 97.6 F (36.4 C), temperature source Oral, resp. rate 16, height 4\' 11"  (1.499 m), weight 90.4 kg (199 lb 3.2 oz), SpO2 99 %.  PHYSICAL EXAMINATION:   Physical Exam  GENERAL:  77 y.o.-year-old patient lying in the bed.  obese  EYES: Pupils equal, round, reactive to light and accommodation. No scleral icterus. Extraocular muscles intact.  HEENT: Head atraumatic, normocephalic. Oropharynx and nasopharynx clear.  NECK:  Supple, no jugular venous distention. No thyroid enlargement, no tenderness.  LUNGS: Normal breath sounds bilaterally, no wheezing, rales, rhonchi. No use of accessory muscles of respiration.  CARDIOVASCULAR: S1, S2 normal. No murmurs, rubs, or gallops.  ABDOMEN: Soft, nontender, nondistended. Bowel sounds present.  External hemorrhoids, perianal abscess tender to touch EXTREMITIES: No cyanosis, clubbing or edema b/l.  Weeping edema on rt forearm NEUROLOGIC: Moves all 4 extremities PSYCHIATRIC: The patient is alert and awake SKIN: No obvious rash, lesion, or ulcer.   LABORATORY PANEL:   CBC Recent Labs  Lab 08/22/17 0404  WBC 9.7  HGB 9.2*  HCT 28.1*  PLT 279   ------------------------------------------------------------------------------------------------------------------ Chemistries  Recent Labs  Lab 08/17/17 0350  08/20/17 0453  08/23/17 0439  NA 138   < > 140   < > 140  K 4.0   < > 4.6   < > 4.2  CL 108   < > 110   < > 108  CO2 22   < > 21*   < > 25  GLUCOSE 99   < > 72   < > 89  BUN 34*   < > 16   < > 13  CREATININE 1.32*   < > 0.95   < > 0.86  CALCIUM 5.6*   < > 5.9*   < > 6.1*  MG  --   --  1.9  --   --   AST 41  --   --   --   --  ALT 29  --   --   --   --   ALKPHOS 292*  --   --   --   --   BILITOT 0.7  --   --   --   --    < > = values in this interval not displayed.   ------------------------------------------------------------------------------------------------------------------  Cardiac Enzymes No results for input(s): TROPONINI in the last 168 hours. ------------------------------------------------------------------------------------------------------------------  RADIOLOGY:  No results found.   ASSESSMENT AND PLAN:   Patient is 77 year old white  female with COPD chronic respiratory failure with sepsis  * Peri anal horseshoe abscess Status post I&D of the abscess on June 21  Wound culture from the abscess with MRSA - start Vanco  * Septic shock due to UTI Septic shock resolved Urine culture -Proteus & E.coli Blood cultures with coagulase-negative Staphylococcus, likely contaminant - recheck 2 sets again today Change  to IV ancef  *Hyperkalemia  resolved.  *Hypocalcemia with muscle cramps also has associated hyper album anemia. Replace calcium.  *Chronic kidney disease 3 at baseline  * Diabetes type 2 placed on sliding scale insulin  * COPD continue oxygen and inhalers as doing at home  * Hyperlipidemia continue home regimen    She is very weak even at baseline, barely goes from bed to bathroom at home per sister, she is also not much participating with PT.  Family is agreeable for HHPT, RN, - will c/s palliative care. She may benefit from outpt Palliative care and transition to Hospice if and when she qualifies   All the records are reviewed and case discussed with Care Management/Social Worker Management plans discussed with the patient, daughter at bedside and they are in agreement.  CODE STATUS: FULL CODE  DVT Prophylaxis: SCDs  TOTAL TIME TAKING CARE OF THIS PATIENT: 35 minutes.   POSSIBLE D/C IN 1 DAYS, DEPENDING ON CLINICAL CONDITION.  Delfino Lovett M.D on 08/23/2017 at 7:43 PM  Between 7am to 6pm - Pager - [phone]  After 6pm go to [email or web address] - password [email or web address] [facility]  [facility] [place] [facility]  Office  [phone]  CC: Primary care physician; Mickey Farber, MD  Note: This dictation was prepared with Dragon dictation along with smaller phrase technology. Any transcriptional errors that result from this process are unintentional.

## 2017-08-23 NOTE — Progress Notes (Signed)
Pharmacy Antibiotic Note  Brenda Glenn is a 77 y.o. female admitted on 08/15/2017 with UTI.  Pharmacy has been consulted for cefazolin dosing. Also vancomycin for WCx.   Plan: Vancomycin 1000 mg IV x1 then 1000 mg IV q24h to start with 8h stacked dosing. Goal trough 15-20 mcg/ml. Ke 0.037, half life 19 h, Vd 45 L  Cefazolin 1 g IV q8h   Height: 4\' 11"  (149.9 cm) Weight: 199 lb 3.2 oz (90.4 kg) IBW/kg (Calculated) : 43.2  Temp (24hrs), Avg:97.8 F (36.6 C), Min:97.4 F (36.3 C), Max:98.5 F (36.9 C)  Recent Labs  Lab 08/17/17 0350 08/18/17 0625 08/20/17 0453 08/21/17 0547 08/22/17 0404 08/23/17 0439  WBC 23.6* 14.7*  --   --  9.7  --   CREATININE 1.32* 1.31* 0.95 1.05* 0.94 0.86    Estimated Creatinine Clearance: 54.6 mL/min (by C-G formula based on SCr of 0.86 mg/dL).    Allergies  Allergen Reactions  . Tequin [Gatifloxacin] Shortness Of Breath  . Bupropion     Other reaction(s): Other (See Comments) shaking  . Keflex [Cephalexin] Other (See Comments)    Unknown  . Lisinopril Other (See Comments)    Unknown  . Varenicline Nausea Only and Nausea And Vomiting    Antimicrobials this admission: Zosyn  6/16 x 1 Meropenem 6/15 >> 6/19 Cefazolin 6/19 >>6/21, 6/24 >> Zosyn 6/21>>6/24 Vanc 6/24>>   Dose adjustments this admission:   Microbiology results: UCx: Proteus and E coli BCx: Staph species (no mecA) single set drawn WCx MRSA BCx x2 ordered as repeat  Thank you for allowing pharmacy to be a part of this patient's care.  Marty HeckWang, Jaques Mineer L 08/23/2017 2:34 PM

## 2017-08-23 NOTE — Care Management (Signed)
Spoke with patient's sister and informed that in agreement with physical therapy recommendation of  discharge to a skilled nursing facility. It appears a bed search has been started.

## 2017-08-23 NOTE — Progress Notes (Signed)
Clinical Social Worker (CSW) met with patient and her husband Glendell Docker was at bedside. CSW presented bed offers. Patient reported that she prefers to go home. CSW explained the benefits of going to SNF. Patient reported that she would consider SNF but did not like any of her bed offers. Patient requested [facility]. [facility] has not responded to the referral yet. MD and RN case manager aware of above. CSW will continue to follow and assist as needed.   [employer], LCSW [phone]

## 2017-08-23 NOTE — Progress Notes (Signed)
Pharmacy Electrolyte Monitoring Consult:  Pharmacy consulted to assist in monitoring and replacing electrolytes in this 77 y.o. female admitted on 08/15/2017 with Urinary Retention and Multiple Complaints   Labs:  Sodium (mmol/L)  Date Value  08/23/2017 140  04/03/2013 132 (L)   Potassium (mmol/L)  Date Value  08/23/2017 4.2  04/03/2013 3.3 (L)   Magnesium (mg/dL)  Date Value  16/10/960406/21/2019 1.9   Calcium (mg/dL)  Date Value  54/09/811906/24/2019 6.1 (LL)   Calcium, Total (mg/dL)  Date Value  14/78/295602/04/2013 8.6   Albumin (g/dL)  Date Value  21/30/865706/22/2019 2.2 (L)  02/20/2013 3.6   CorrCa: 7.5 mg/dL  Assessment/Plan: Will continue order for Tums 1000mg  PO tid with meals. Patient is on vitamin D [id number] units every 14 days. Discussed with hospitalist. Will f/u AM labs.   Crist FatHannah Ephrata [place]erville, PharmD, BCPS Clinical Pharmacist 08/23/2017 2:38 PM

## 2017-08-23 NOTE — Care Management Important Message (Signed)
Important Message  Patient Details  Name: Brenda Glenn MRN: [id number] Date of Birth: 10-10-1940   Medicare Important Message Given:  Yes    Eber HongGreene, Judson Tsan R, RN 08/23/2017, 10:53 AM

## 2017-08-23 NOTE — Progress Notes (Signed)
PT Cancellation Note  Patient Details Name: Brenda Glenn MRN: [id number] DOB: 1940/04/14   Cancelled Treatment:    Reason Eval/Treat Not Completed: Patient at procedure or test/unavailable.  Order received.  Chart reviewed.  RN consulted.  Pt currently using bedpan and states that she will try to work with PT later if re-attempted.  Will try again if time allows.   Glenetta HewSarah Cambreigh Dearing, PT, DPT 08/23/2017, 9:55 AM

## 2017-08-24 LAB — BASIC METABOLIC PANEL
ANION GAP: 6 (ref 5–15)
BUN: 12 mg/dL (ref 8–23)
CHLORIDE: 108 mmol/L (ref 98–111)
CO2: 26 mmol/L (ref 22–32)
Calcium: 5.9 mg/dL — CL (ref 8.9–10.3)
Creatinine, Ser: 0.82 mg/dL (ref 0.44–1.00)
GFR calc Af Amer: >60 mL/min (ref >60)
GLUCOSE: 104 mg/dL — AB (ref 70–99)
POTASSIUM: 4.2 mmol/L (ref 3.5–5.1)
SODIUM: 140 mmol/L (ref 135–145)

## 2017-08-24 LAB — CBC
HCT: 26.7 % — ABNORMAL LOW (ref 35.0–47.0)
Hemoglobin: 8.6 g/dL — ABNORMAL LOW (ref 12.0–16.0)
MCH: 31.5 pg (ref 26.0–34.0)
MCHC: 32.4 g/dL (ref 32.0–36.0)
MCV: 97.3 fL (ref 80.0–100.0)
Platelets: 307 10*3/uL (ref 150–440)
RBC: 2.74 MIL/uL — ABNORMAL LOW (ref 3.80–5.20)
RDW: 18.6 % — AB (ref 11.5–14.5)
WBC: 6.4 10*3/uL (ref 3.6–11.0)

## 2017-08-24 LAB — GLUCOSE, CAPILLARY
GLUCOSE-CAPILLARY: 75 mg/dL (ref 70–99)
GLUCOSE-CAPILLARY: 124 mg/dL — AB (ref 70–99)
Glucose-Capillary: 135 mg/dL — ABNORMAL HIGH (ref 70–99)
Glucose-Capillary: 129 mg/dL — ABNORMAL HIGH (ref 70–99)

## 2017-08-24 LAB — MAGNESIUM: Magnesium: 1.6 mg/dL — ABNORMAL LOW (ref 1.7–2.4)

## 2017-08-24 LAB — PHOSPHORUS: Phosphorus: 2.4 mg/dL — ABNORMAL LOW (ref 2.5–4.6)

## 2017-08-24 MED ORDER — POLYETHYLENE GLYCOL 3350 17 G PO PACK: 17.0000 g | PACK | Freq: Every day | ORAL | Status: DC

## 2017-08-24 MED ORDER — ENSURE ENLIVE PO LIQD
237.0000 mL | Freq: Two times a day (BID) | ORAL | Status: DC
  Administered 2017-08-24 – 2017-08-26 (×3): 237 mL via ORAL

## 2017-08-24 MED ORDER — BISACODYL 5 MG PO TBEC
10.0000 mg | DELAYED_RELEASE_TABLET | Freq: Every day | ORAL | Status: DC
  Administered 2017-08-24: 10 mg via ORAL
  Filled 2017-08-24 (×3): qty 2

## 2017-08-24 MED ORDER — VITAMIN C 500 MG PO TABS
250.0000 mg | ORAL_TABLET | Freq: Two times a day (BID) | ORAL | Status: DC
  Administered 2017-08-24 – 2017-08-26 (×4): 250 mg via ORAL
  Filled 2017-08-24 (×5): qty 0.5

## 2017-08-24 MED ORDER — SODIUM CHLORIDE 0.9 % IV SOLN
2.0000 g | Freq: Once | INTRAVENOUS | Status: AC
  Administered 2017-08-24: 2 g via INTRAVENOUS
  Filled 2017-08-24: qty 20

## 2017-08-24 MED ORDER — POLYETHYLENE GLYCOL 3350 17 G PO PACK
17.0000 g | PACK | Freq: Every day | ORAL | Status: DC
  Administered 2017-08-24: 17 g via ORAL
  Filled 2017-08-24 (×2): qty 1

## 2017-08-24 NOTE — Progress Notes (Signed)
[facility] - [place] [facility]   PATIENT NAME: Brenda Glenn    MR#:  [id number]  DATE OF BIRTH:  [birth year]  SUBJECTIVE:  CHIEF COMPLAINT:   Chief Complaint  Patient presents with  . Urinary Retention  . Multiple Complaints  sitting in chair and motivated to work with PT REVIEW OF SYSTEMS:    Review of Systems  Constitutional: Positive for malaise/fatigue. Negative for chills and fever.  HENT: Negative for sore throat.   Eyes: Negative for blurred vision, double vision and pain.  Respiratory: Negative for cough, hemoptysis, shortness of breath and wheezing.   Cardiovascular: Negative for chest pain, palpitations, orthopnea and leg swelling.  Gastrointestinal: Negative for abdominal pain, constipation, diarrhea, heartburn, nausea and vomiting.  Genitourinary: Negative for dysuria and hematuria.       Perianal pain  Musculoskeletal: Positive for back pain. Negative for joint pain.  Skin: Negative for rash.  Neurological: Negative for sensory change, speech change, focal weakness and headaches.  Endo/Heme/Allergies: Does not bruise/bleed easily.  Psychiatric/Behavioral: Negative for depression. The patient is not nervous/anxious.   Pain, hemorrhoids, muscle cramps  DRUG ALLERGIES:   Allergies  Allergen Reactions  . Tequin [Gatifloxacin] Shortness Of Breath  . Bupropion     Other reaction(s): Other (See Comments) shaking  . Keflex [Cephalexin] Other (See Comments)    Unknown  . Lisinopril Other (See Comments)    Unknown  . Varenicline Nausea Only and Nausea And Vomiting    VITALS:  Blood pressure (!) 143/72, pulse 89, temperature 98.4 F (36.9 C), temperature source Oral, resp. rate 20, height 4\' 11"  (1.499 m), weight 90.4 kg (199 lb 3.2 oz), SpO2 96 %.  PHYSICAL EXAMINATION:   Physical Exam  GENERAL:  77 y.o.-year-old patient lying in the bed. obese  EYES: Pupils equal, round, reactive to light and accommodation. No scleral icterus. Extraocular  muscles intact.  HEENT: Head atraumatic, normocephalic. Oropharynx and nasopharynx clear.  NECK:  Supple, no jugular venous distention. No thyroid enlargement, no tenderness.  LUNGS: Normal breath sounds bilaterally, no wheezing, rales, rhonchi. No use of accessory muscles of respiration.  CARDIOVASCULAR: S1, S2 normal. No murmurs, rubs, or gallops.  ABDOMEN: Soft, nontender, nondistended. Bowel sounds present.  External hemorrhoids, perianal abscess tender to touch EXTREMITIES: No cyanosis, clubbing or edema b/l.  Weeping edema on rt forearm NEUROLOGIC: Moves all 4 extremities PSYCHIATRIC: The patient is alert and awake SKIN: No obvious rash, lesion, or ulcer.   LABORATORY PANEL:   CBC Recent Labs  Lab 08/24/17 0451  WBC 6.4  HGB 8.6*  HCT 26.7*  PLT 307   ------------------------------------------------------------------------------------------------------------------ Chemistries  Recent Labs  Lab 08/24/17 0451  NA 140  K 4.2  CL 108  CO2 26  GLUCOSE 104*  BUN 12  CREATININE 0.82  CALCIUM 5.9*  MG 1.6*   ------------------------------------------------------------------------------------------------------------------  Cardiac Enzymes No results for input(s): TROPONINI in the last 168 hours. ------------------------------------------------------------------------------------------------------------------  RADIOLOGY:  No results found.   ASSESSMENT AND PLAN:   Patient is 77 year old white female with COPD chronic respiratory failure with sepsis  * Peri anal horseshoe abscess Status post I&D of the abscess on June 21  Wound culture from the abscess with MRSA - continue Vanco  * Septic shock due to UTI Septic shock resolved Urine culture -Proteus & E.coli Blood cultures with coagulase-negative Staphylococcus, likely contaminant - recheck 2 sets again on 6/24 - await results Can stop ancef  *Hyperkalemia  resolved.  *Hypocalcemia with muscle cramps also  has associated hyper  album anemia. Replace calcium per pharmacy protocol  *Chronic kidney disease 3 at baseline  * Diabetes type 2: continue sliding scale insulin  * COPD continue oxygen and inhalers as doing at home  * Hyperlipidemia continue home regimen    She is very weak even at baseline, barely goes from bed to bathroom at home per sister.  Family is agreeable for HHPT, RN, - pending palliative care c/s. She may benefit from outpt Palliative care and transition to Hospice if and when she qualifies  CSW and PT continues to recommend SNF and now husband would like same. I still don't think this is the best overall NSOC for her but if this is what everyone and patient wants I'm ok with it.   All the records are reviewed and case discussed with Care Management/Social Worker Management plans discussed with the patient, daughter at bedside and they are in agreement.  CODE STATUS: FULL CODE  DVT Prophylaxis: SCDs  TOTAL TIME TAKING CARE OF THIS PATIENT: 35 minutes.   POSSIBLE D/C IN 1 DAYS, DEPENDING ON CLINICAL CONDITION.  Delfino Lovett M.D on 08/24/2017 at 5:20 PM  Between 7am to 6pm - Pager - [phone]  After 6pm go to [email or web address] - password [email or web address] [facility]  [facility] [place] [facility]  Office  [phone]  CC: Primary care physician; Mickey Farber, MD  Note: This dictation was prepared with Dragon dictation along with smaller phrase technology. Any transcriptional errors that result from this process are unintentional.

## 2017-08-24 NOTE — Progress Notes (Signed)
Pharmacy Electrolyte Monitoring Consult:  Pharmacy consulted to assist in monitoring and replacing electrolytes in this 77 y.o. female admitted on 08/15/2017 with Urinary Retention and Multiple Complaints   Labs:  Sodium (mmol/L)  Date Value  08/24/2017 140  04/03/2013 132 (L)   Potassium (mmol/L)  Date Value  08/24/2017 4.2  04/03/2013 3.3 (L)   Magnesium (mg/dL)  Date Value  16/10/960406/21/2019 1.9   Calcium (mg/dL)  Date Value  54/09/811906/25/2019 5.9 (LL)   Calcium, Total (mg/dL)  Date Value  14/78/295602/04/2013 8.6   Albumin (g/dL)  Date Value  21/30/865706/22/2019 2.2 (L)  02/20/2013 3.6   CorrCa: 7.3 mg/dL  Assessment/Plan: Will order calcium gluconate 2 g iv once in addition to oral calcium supplementation. Will recheck f/u AM labs.   Luisa HartScott Gini Caputo, PharmD Clinical Pharmacist  08/24/2017 8:12 AM

## 2017-08-24 NOTE — Progress Notes (Signed)
PT Cancellation Note  Patient Details Name: [name] NoJamie Graves Touhey MRN: [id number] DOB: Dec 22, 1940   Cancelled Treatment:    Reason Eval/Treat Not Completed: Patient declined, no reason specified.  Pt just beginning to eat breakfast.  Will re-attempt later if time allows.   Glenetta HewSarah Thaniel Coluccio, PT, DPT 08/24/2017, 9:01 AM

## 2017-08-24 NOTE — Plan of Care (Signed)
  Problem: Activity: Goal: Risk for activity intolerance will decrease Outcome: Progressing   Problem: Nutrition: Goal: Adequate nutrition will be maintained Outcome: Progressing   Problem: Pain Managment: Goal: General experience of comfort will improve Outcome: Progressing   Problem: Safety: Goal: Ability to remain free from injury will improve Outcome: Progressing   

## 2017-08-24 NOTE — Progress Notes (Signed)
Pt able to ambulate/transfer from chair to bed after working with PT today, she appears to be in better spirits than yesterday, very small BM this afternoon, pt still feels like she needs to go, but cannot, offered prune juice which she accepted. Family has questions for palliative care this evening, sticky note placed in chart.

## 2017-08-24 NOTE — Plan of Care (Signed)
  Problem: Education: Goal: Knowledge of General Education information will improve Outcome: Progressing   Problem: Health Behavior/Discharge Planning: Goal: Ability to manage health-related needs will improve Outcome: Progressing   Problem: Clinical Measurements: Goal: Ability to maintain clinical measurements within normal limits will improve Outcome: Progressing Goal: Will remain free from infection Outcome: Progressing Goal: Diagnostic test results will improve Outcome: Progressing Goal: Respiratory complications will improve Outcome: Progressing Goal: Cardiovascular complication will be avoided Outcome: Progressing   Problem: Activity: Goal: Risk for activity intolerance will decrease Outcome: Progressing   Problem: Nutrition: Goal: Adequate nutrition will be maintained Outcome: Progressing   Problem: Coping: Goal: Level of anxiety will decrease Outcome: Progressing   Problem: Elimination: Goal: Will not experience complications related to bowel motility Outcome: Progressing Goal: Will not experience complications related to urinary retention Outcome: Progressing   Problem: Pain Managment: Goal: General experience of comfort will improve Outcome: Progressing   Problem: Safety: Goal: Ability to remain free from injury will improve Outcome: Progressing   Problem: Skin Integrity: Goal: Risk for impaired skin integrity will decrease Outcome: Progressing   Problem: Clinical Measurements: Goal: Signs and symptoms of infection will decrease Outcome: Progressing   Problem: Respiratory: Goal: Ability to maintain adequate ventilation will improve Outcome: Progressing

## 2017-08-24 NOTE — Progress Notes (Signed)
Per patient's husband Baldo AshCarl they can bring the flector pain patch from home to [facility]. [facility] they can accept patient. Plan is for patient to D/C to [facility] when medically stable.   Baker Hughes IncorporatedBailey Aniesa Boback, LCSW [phone]

## 2017-08-24 NOTE — Evaluation (Signed)
Physical Therapy Re-evaluation Patient Details Name: Brenda Glenn MRN: [id number] DOB: 12/27/1940 Today's Date: 08/24/2017   History of Present Illness  77 yo female was admitted for sepsis of UTI with urinary retention, low Ca+, hypotension and had recent fall with concussion.  PMHx:  2LO2, COPD, heart murmur, PNA, renal insufficiency, HTN, DM, COPD,    Clinical Impression  Patient demonstrated progression today in bed mobility and transfers.  She was able to sit at EOB with min A and perform seated there ex without pain.  Pt experiences R shoulder pain and decreased ROM due to a previous surgery and needs to be mindful of this during transfers and there ex.  PT and pt's family member assisted pt in standing with RW and performing a pivot transfer to chair.  She required min A for physical assistance and heavy VC's for sequencing and lateral rocking to initiate stepping sequence.  PT assisted pt in positioning for comfort in chair and educated pt concerning importance of frequent HEP participation and mobility.  Pt expressed understanding.  Pt was eager to work with PT today and appeared pleased with herself following transfer.  She states that she wants to continue to improve.  Pt will continue to benefit from skilled PT with focus on strength, safe transfers, ambulation, proper use of RW and HEP.    Follow Up Recommendations SNF    Equipment Recommendations  None recommended by PT    Recommendations for Other Services       Precautions / Restrictions        Mobility  Bed Mobility Overal bed mobility: Needs Assistance Bed Mobility: Supine to Sit;Sit to Supine     Supine to sit: Mod assist Sit to supine: Mod assist   General bed mobility comments: Hand held assist to sit upright.  Able to scoot to EOB.  Transfers Overall transfer level: Needs assistance Equipment used: Rolling walker (2 wheeled) Transfers: Sit to/from [employer] Sit to Stand: Mod  assist;Min assist Stand pivot transfers: Min assist       General transfer comment: Bed elevated, pt able to stand with very little physical assist.  VC's for upright posture and sequencing during pivot transfer.  Pt expressed difficutly in initiating swing phase of R LE and PT directed pt in lateral rocking to initiate stepping.  Pt was able to complete tranfer, backing to chair and controlling descent to chair.  Ambulation/Gait Ambulation/Gait assistance: (Did not perform.)           General Gait Details: unable to take steps  Stairs            Wheelchair Mobility    Modified Rankin (Stroke Patients Only)       Balance Overall balance assessment: Needs assistance Sitting-balance support: Feet supported;Bilateral upper extremity supported Sitting balance-Leahy Scale: Fair   Postural control: Posterior lean Standing balance support: Bilateral upper extremity supported;During functional activity Standing balance-Leahy Scale: Poor                               Pertinent Vitals/Pain Pain Assessment: Faces Faces Pain Scale: Hurts a little bit Pain Location: R shoulder pain with flexion and abduction    Home Living Family/patient expects to be discharged to:: Skilled nursing facility Living Arrangements: Spouse/significant other Available Help at Discharge: Family Type of Home: House Home Access: Ramped entrance     Home Layout: One level Home Equipment: Cane - single point;Walker - 2  wheels      Prior Function Level of Independence: Independent with assistive device(s)         Comments: reports she uses SPC for all mobility, not very active, short distances at baseline     Hand Dominance   Dominant Hand: Right    Extremity/Trunk Assessment   Upper Extremity Assessment Upper Extremity Assessment: Generalized weakness    Lower Extremity Assessment Lower Extremity Assessment: Generalized weakness(Grossly 4-/5 bilaterally)     Cervical / Trunk Assessment Cervical / Trunk Assessment: Kyphotic  Communication   Communication: No difficulties  Cognition Arousal/Alertness: Awake/alert Behavior During Therapy: Flat affect Overall Cognitive Status: Within Functional Limits for tasks assessed                                 General Comments: Able to follow commands consistently.      General Comments      Exercises General Exercises - Lower Extremity Ankle Circles/Pumps: AROM;20 reps;Seated;Both Long Arc Quad: Strengthening;Both;10 reps;Seated Hip ABduction/ADduction: Strengthening;Both;10 reps;Seated Other Exercises Other Exercises: Education concerning HEP and importance of frequent ther ex. Other Exercises: Education concerning positioning for comfort regarding R UE and rectal area following surgery.   Assessment/Plan    PT Assessment Patient needs continued PT services  PT Problem List Decreased strength;Decreased range of motion;Decreased activity tolerance;Decreased balance;Decreased mobility;Decreased coordination;Decreased knowledge of use of DME;Decreased safety awareness;Cardiopulmonary status limiting activity       PT Treatment Interventions DME instruction;Gait training;Functional mobility training;Therapeutic activities;Therapeutic exercise;Balance training;Neuromuscular re-education;Patient/family education    PT Goals (Current goals can be found in the Care Plan section)  Acute Rehab PT Goals Patient Stated Goal: to get stronger and get home PT Goal Formulation: With patient/family Time For Goal Achievement: 09/02/17 Potential to Achieve Goals: Good    Frequency Min 2X/week   Barriers to discharge Decreased caregiver support(home with husband who cannot lift her)      Co-evaluation               AM-PAC PT "6 Clicks" Daily Activity  Outcome Measure Difficulty turning over in bed (including adjusting bedclothes, sheets and blankets)?: A Lot Difficulty moving  from lying on back to sitting on the side of the bed? : A Lot Difficulty sitting down on and standing up from a chair with arms (e.g., wheelchair, bedside commode, etc,.)?: A Lot Help needed moving to and from a bed to chair (including a wheelchair)?: A Lot Help needed walking in hospital room?: A Lot Help needed climbing 3-5 steps with a railing? : Total 6 Click Score: 11    End of Session Equipment Utilized During Treatment: Gait belt Activity Tolerance: Patient limited by fatigue;Treatment limited secondary to agitation Patient left: with call bell/phone within reach;with family/visitor present;in chair;with chair alarm set Nurse Communication: Mobility status PT Visit Diagnosis: Unsteadiness on feet (R26.81);Muscle weakness (generalized) (M62.81);Difficulty in walking, not elsewhere classified (R26.2);Adult, failure to thrive (R62.7)    Time: 0940-1010 PT Time Calculation (min) (ACUTE ONLY): 30 min   Charges:   PT Evaluation $PT Re-evaluation: 1 Re-eval PT Treatments $Therapeutic Exercise: 8-22 mins $Therapeutic Activity: 8-22 mins   PT G Codes:   PT G-Codes **NOT FOR INPATIENT CLASS** Functional Assessment Tool Used: AM-PAC 6 Clicks Basic Mobility    Glenetta HewSarah Nevada Kirchner, PT, DPT   Glenetta HewSarah Miciah Shealy 08/24/2017, 10:26 AM

## 2017-08-24 NOTE — Progress Notes (Signed)
Initial Nutrition Assessment  DOCUMENTATION CODES:   Obesity unspecified  INTERVENTION:   Ensure Enlive po BID, each supplement provides 350 kcal and 20 grams of protein  Magic cup TID with meals, each supplement provides 290 kcal and 9 grams of protein  MVI daily   Vitamin C 258m po BID x 10 days   Pt at high refeeding risk; recommend monitor K, Mg, and P labs  Bowel regimen per MD   NUTRITION DIAGNOSIS:   Inadequate oral intake related to acute illness as evidenced by meal completion < 25%.  GOAL:   Patient will meet greater than or equal to 90% of their needs  MONITOR:   PO intake, Supplement acceptance, Labs, Weight trends, Skin, I & O's  REASON FOR ASSESSMENT:   LOS    ASSESSMENT:   77yo female was admitted for sepsis of UTI with urinary retention, low Ca+, hypotension and had recent fall with concussion.  PMHx: CKD III, heart murmur, PNA, HTN, DM, COPD    Met with pt in room today. Pt reports poor appetite and oral intake since admit. Pt eating <25% of meals for > 7 days now. Pt reports that her appetite is not good at baseline and that she has not really been eating well for the past few weeks. Pt does drink Ensure and is willing to drink these in hospital. Per chart, pt is weight stable pta but has gained ~14lbs since admit; pt - 6.9L on her I & Os. Pt does have mild to moderate generalized edema and weeping edema in her right arm. Pt s/p I & D 6/21. Pt noted to have widespread ecchymosis; recommend vitamin C supplementation. RD will order supplements. Pt likely at high refeeding risk; recommend monitor K, Mg, and P labs. Last BM noted was 6/19; recommend bowel regimen as needed per MD.   Medications reviewed and include: allopurinol, dulcolax, tums, colace, lovenox, folic acid, insulin, MVI, nicotine, oxycodone, protonix, miralax, senokot, thiamine, vitamin D, cefazolin, vancomycin     Labs reviewed: K 4.2 wnl, Ca 5.9(L) adj. 7.82(L), P 2.4(L), Mg 1.6(L), alb  1.6(L) Hgb 8.6(L), Hct 26.7(L) cbgs- 128, 147, 110, 75, 135 x 24 hrs AIC 6.0(H)- 4/22  NUTRITION - FOCUSED PHYSICAL EXAM:    Most Recent Value  Orbital Region  No depletion  Upper Arm Region  No depletion  Thoracic and Lumbar Region  No depletion  Buccal Region  No depletion  Temple Region  No depletion  Clavicle Bone Region  No depletion  Clavicle and Acromion Bone Region  No depletion  Scapular Bone Region  No depletion  Dorsal Hand  No depletion  Patellar Region  No depletion  Anterior Thigh Region  No depletion  Posterior Calf Region  No depletion  Edema (RD Assessment)  Moderate  Hair  Reviewed  Eyes  Reviewed  Mouth  Reviewed  Skin  Reviewed  Nails  Reviewed     Diet Order:   Diet Order           Diet regular Room service appropriate? Yes; Fluid consistency: Thin  Diet effective now         EDUCATION NEEDS:   Education needs have been addressed  Skin:  Skin Assessment: Reviewed RN Assessment(incision rectum, ecchymosis )  Last BM:  6/19- type 6  Height:   Ht Readings from Last 1 Encounters:  08/15/17 4' 11"  (1.499 m)    Weight:   Wt Readings from Last 1 Encounters:  08/21/17 199 lb 3.2 oz (90.4  kg)    Ideal Body Weight:  44.5 kg  BMI:  Body mass index is 40.23 kg/m.  Estimated Nutritional Needs:   Kcal:  1400-1600kcal/day   Protein:  82-91g/day   Fluid:  >1.3L/day   [name], RD, [name] Pager #- [phone] Office#- [phone] After Hours Pager: [phone]

## 2017-08-25 ENCOUNTER — Encounter: Payer: Self-pay | Admitting: *Deleted

## 2017-08-25 DIAGNOSIS — R531 Weakness: Secondary | ICD-10-CM

## 2017-08-25 DIAGNOSIS — K610 Anal abscess: Secondary | ICD-10-CM

## 2017-08-25 DIAGNOSIS — Z7189 Other specified counseling: Secondary | ICD-10-CM

## 2017-08-25 DIAGNOSIS — Z515 Encounter for palliative care: Secondary | ICD-10-CM

## 2017-08-25 DIAGNOSIS — N390 Urinary tract infection, site not specified: Secondary | ICD-10-CM

## 2017-08-25 LAB — BASIC METABOLIC PANEL
Anion gap: 8 (ref 5–15)
BUN: 10 mg/dL (ref 8–23)
CALCIUM: 6.7 mg/dL — AB (ref 8.9–10.3)
CO2: 25 mmol/L (ref 22–32)
CREATININE: 0.61 mg/dL (ref 0.44–1.00)
Chloride: 108 mmol/L (ref 98–111)
Glucose, Bld: 104 mg/dL — ABNORMAL HIGH (ref 70–99)
Potassium: 4.2 mmol/L (ref 3.5–5.1)
Sodium: 141 mmol/L (ref 135–145)

## 2017-08-25 LAB — CBC
HCT: 30 % — ABNORMAL LOW (ref 35.0–47.0)
Hemoglobin: 9.6 g/dL — ABNORMAL LOW (ref 12.0–16.0)
MCH: 31.1 pg (ref 26.0–34.0)
MCHC: 32.1 g/dL (ref 32.0–36.0)
MCV: 97 fL (ref 80.0–100.0)
PLATELETS: 374 10*3/uL (ref 150–440)
RBC: 3.09 MIL/uL — ABNORMAL LOW (ref 3.80–5.20)
RDW: 18.3 % — AB (ref 11.5–14.5)
WBC: 9.4 10*3/uL (ref 3.6–11.0)

## 2017-08-25 LAB — GLUCOSE, CAPILLARY
GLUCOSE-CAPILLARY: 84 mg/dL (ref 70–99)
GLUCOSE-CAPILLARY: 162 mg/dL — AB (ref 70–99)
Glucose-Capillary: 120 mg/dL — ABNORMAL HIGH (ref 70–99)
Glucose-Capillary: 119 mg/dL — ABNORMAL HIGH (ref 70–99)

## 2017-08-25 LAB — MAGNESIUM: MAGNESIUM: 1.7 mg/dL (ref 1.7–2.4)

## 2017-08-25 LAB — PHOSPHORUS: PHOSPHORUS: 2.5 mg/dL (ref 2.5–4.6)

## 2017-08-25 LAB — ALBUMIN: Albumin: 2.4 g/dL — ABNORMAL LOW (ref 3.5–5.0)

## 2017-08-25 MED ORDER — MAGNESIUM OXIDE 400 (241.3 MG) MG PO TABS
400.0000 mg | ORAL_TABLET | Freq: Two times a day (BID) | ORAL | Status: AC
  Administered 2017-08-25 (×2): 400 mg via ORAL
  Filled 2017-08-25 (×2): qty 1

## 2017-08-25 NOTE — Progress Notes (Signed)
[employer] - [place] at [facility]   PATIENT NAME: Brenda Glenn    MR#:  [id number]  DATE OF BIRTH:  1940-07-06  SUBJECTIVE:  CHIEF COMPLAINT:   Chief Complaint  Patient presents with  . Urinary Retention  . Multiple Complaints   Generalized weakness, hypoxia this morning on 3 L oxygen.  She was on home oxygen 2 L. REVIEW OF SYSTEMS:    Review of Systems  Constitutional: Positive for malaise/fatigue. Negative for chills and fever.  HENT: Negative for sore throat.   Eyes: Negative for blurred vision, double vision and pain.  Respiratory: Negative for cough, hemoptysis, shortness of breath and wheezing.   Cardiovascular: Negative for chest pain, palpitations, orthopnea and leg swelling.  Gastrointestinal: Negative for abdominal pain, constipation, diarrhea, heartburn, nausea and vomiting.  Genitourinary: Negative for dysuria and hematuria.       Perianal pain  Musculoskeletal: Positive for back pain. Negative for joint pain.  Skin: Negative for rash.  Neurological: Negative for sensory change, speech change, focal weakness and headaches.  Endo/Heme/Allergies: Does not bruise/bleed easily.  Psychiatric/Behavioral: Negative for depression. The patient is not nervous/anxious.   Pain, hemorrhoids, muscle cramps  DRUG ALLERGIES:   Allergies  Allergen Reactions  . Tequin [Gatifloxacin] Shortness Of Breath  . Bupropion     Other reaction(s): Other (See Comments) shaking  . Keflex [Cephalexin] Other (See Comments)    Unknown  . Lisinopril Other (See Comments)    Unknown  . Varenicline Nausea Only and Nausea And Vomiting    VITALS:  Blood pressure (!) 106/57, pulse 81, temperature (!) 97.4 F (36.3 C), temperature source Oral, resp. rate 19, height 4\' 11"  (1.499 m), weight 187 lb 3.2 oz (84.9 kg), SpO2 90 %.  PHYSICAL EXAMINATION:   Physical Exam  GENERAL:  77 y.o.-year-old patient lying in the bed. obese  EYES: Pupils equal, round, reactive to light and  accommodation. No scleral icterus. Extraocular muscles intact.  HEENT: Head atraumatic, normocephalic. Oropharynx and nasopharynx clear.  NECK:  Supple, no jugular venous distention. No thyroid enlargement, no tenderness.  LUNGS: Normal breath sounds bilaterally, no wheezing, rales, rhonchi. No use of accessory muscles of respiration.  CARDIOVASCULAR: S1, S2 normal. No murmurs, rubs, or gallops.  ABDOMEN: Soft, nontender, nondistended. Bowel sounds present.  External hemorrhoids, perianal abscess tender to touch EXTREMITIES: No cyanosis, clubbing or edema b/l.  Weeping edema on rt forearm NEUROLOGIC: Moves all 4 extremities PSYCHIATRIC: The patient is alert and awake SKIN: No obvious rash, lesion, or ulcer.   LABORATORY PANEL:   CBC Recent Labs  Lab 08/25/17 0451  WBC 9.4  HGB 9.6*  HCT 30.0*  PLT 374   ------------------------------------------------------------------------------------------------------------------ Chemistries  Recent Labs  Lab 08/25/17 0451  NA 141  K 4.2  CL 108  CO2 25  GLUCOSE 104*  BUN 10  CREATININE 0.61  CALCIUM 6.7*  MG 1.7   ------------------------------------------------------------------------------------------------------------------  Cardiac Enzymes No results for input(s): TROPONINI in the last 168 hours. ------------------------------------------------------------------------------------------------------------------  RADIOLOGY:  No results found.   ASSESSMENT AND PLAN:   Patient is 77 year old white female with COPD chronic respiratory failure with sepsis  * Peri anal horseshoe abscess Status post I&D of the abscess on June 21  Wound culture from the abscess with MRSA - continue Vanco, may change to p.o. Bactrim after discharge.  * Septic shock due to UTI Septic shock resolved Urine culture -Proteus & E.coli Blood cultures with coagulase-negative Staphylococcus, likely contaminant - recheck 2 sets again on 6/24 -negative so  far Stopped ancef.  *Hyperkalemia  resolved.  *Hypocalcemia with muscle cramps also has associated hyper album anemia. Replaced calcium is improving.  *Chronic kidney disease 3 at baseline  * Diabetes type 2: continue sliding scale insulin  * Chronic respiratory failure with COPD,  continue oxygen and inhalers as doing at home  * Hyperlipidemia continue home regimen  She is very weak even at baseline, barely goes from bed to bathroom at home per sister.  Family is agreeable for HHPT, RN, per palliative care c/s. She may benefit from outpt Palliative care and transition to Hospice if and when she qualifies  She will be discharged to SNF with palliative care follow-up. All the records are reviewed and case discussed with Care Management/Social Worker Management plans discussed with the patient, daughter at bedside and they are in agreement.  CODE STATUS: FULL CODE  DVT Prophylaxis: SCDs  TOTAL TIME TAKING CARE OF THIS PATIENT: 28 minutes.   POSSIBLE D/C IN 1 DAYS, DEPENDING ON CLINICAL CONDITION.  Shaune PollackQing Felix Pratt M.D on 08/25/2017 at 5:30 PM  Between 7am to 6pm - Pager - [phone]  After 6pm go to [email or web address] - password [email or web address]  [facility] [place] [facility]  Office  [phone]  CC: Primary care physician; Mickey Farberhies, David, MD  Note: This dictation was prepared with Dragon dictation along with smaller phrase technology. Any transcriptional errors that result from this process are unintentional.

## 2017-08-25 NOTE — Progress Notes (Signed)
Pharmacy Electrolyte Monitoring Consult:  Pharmacy consulted to assist in monitoring and replacing electrolytes in this 77 y.o. female admitted on 08/15/2017 with Urinary Retention and Multiple Complaints   Labs:  Sodium (mmol/L)  Date Value  08/25/2017 141  04/03/2013 132 (L)   Potassium (mmol/L)  Date Value  08/25/2017 4.2  04/03/2013 3.3 (L)   Magnesium (mg/dL)  Date Value  16/10/960406/26/2019 1.7   Phosphorus (mg/dL)  Date Value  54/09/811906/26/2019 2.5   Calcium (mg/dL)  Date Value  14/78/295606/26/2019 6.7 (L)   Calcium, Total (mg/dL)  Date Value  21/30/865702/04/2013 8.6   Albumin (g/dL)  Date Value  84/69/629506/26/2019 2.4 (L)  02/20/2013 3.6   CorrCa: 8 mg/dL  Assessment/Plan: Will supplement magnesium with Magox 400 mg bid x 2. Will continue to follow daily labs including Mg and phos with refeeding risk.   Luisa HartScott Moncerrat Burnstein, PharmD Clinical Pharmacist  08/25/2017 11:12 AM

## 2017-08-25 NOTE — Progress Notes (Signed)
New referral for outpatient Palliative to follow at [facility] following a Palliative Medicine consult. No discharge date at this time/ Patient information faxed to referral. Dayna BarkerKaren robertson RN, BSN, [facility] [place], hospital Liaison [phone]

## 2017-08-25 NOTE — Progress Notes (Signed)
Advanced Care Plan.  Purpose of Encounter: CODE STATUS and palliative care Parties in Attendance: The patient, her daughter and sister. Patient's Decisional Capacity: Yes. Medical Story: Patient is 77 year old white female with COPD chronic respiratory failure with sepsis.  She was admitted for septic shock due to UTI and perianal abscess.  She also has chronic respiratory failure with COPD, diabetes, CKD stage III, hypocalcemia, hyperkalemia and hyperlipidemia.  She is very weak and fragile.  I discussed with the patient, her daughter and the sister about CODE STATUS and palliative care.  They have agreed to palliative care follow-up at facility but wants full code for now.  Plan:  Code Status: Full code. Time spent discussing advance care planning: 17 minutes.

## 2017-08-25 NOTE — Consult Note (Signed)
Consultation Note Date: 08/25/2017   Patient Name: Brenda Glenn  DOB: 05/24/1940  MRN: [id number]  Age / Sex: 77 y.o., female  PCP: Brenda Kayser, MD Referring Physician: Demetrios Loll, MD  Reason for Consultation: Establishing goals of care  HPI/Patient Profile: 77 y.o. female admitted on 08/15/2017 from home with complaints of weakness and urinary retention. She has a past medical history significant for osteoarthritis, COPD (2L home use), diabetes, GERD, hypertension, heart murmur, back pain, and hypercholesterolemia. During ED course patient reports symptoms have been going on for more than 2 weeks. She was found to have a UTI and signs of sepsis. She received fluid boluses due to hypotension. She denied chest pain, nausea, vomiting, or diarrhea. She does endorse chronic shortness of breath and cough which is baseline, as well as burning and urinary retention. Since admission she has received fluid and electrolyte replacement and s/p I&D of a peri anal horseshoe abcess which was MRSA positive. She continues to exhibit signs of weakness and deconditioning. Palliative Medicine team consulted for goals of care discussion.    Clinical Assessment and Goals of Care: I have reviewed medical records including lab results, imaging, Epic notes, and MAR, received report from the bedside RN, and assessed the patient. I then met at the bedside with patient, her husband, and 2 daughters to discuss diagnosis prognosis, GOC, EOL wishes, disposition and options. Patient is alert and oriented x3. She was able to participate in goals of care discussion with her family and I.   I introduced Palliative Medicine as specialized medical care for people living with serious illness. It focuses on providing relief from the symptoms and stress of a serious illness. The goal is to improve quality of life for both the patient and the  family.  We discussed a brief life review of the patient. 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 She has 2 daughters from a previous marriage. She has been married to her current husband for 12 years. She is a woman of [place] and loves shopping and going out to eat when she could.   As far as functional and nutritional status family reports noticing a decline in her health since November 2018 post shoulder surgery. Prior to her surgery last fall patient was very active in her church and community, she was still driving, and was able to ambulate without assistance or devices. However, since the surgery she has become much more anxious. She has lost all interest in activities that bring her joy. Daughters state the only time they can get her to leave the house is to go to a doctor's appointment. She was receiving home health assistance with her ADLs due to increased fatigue and shortness of breath. Family states she has not been to church in months. She has been observed sleeping more throughout the day over the past month. She currently walks with a walker and is on chronic home oxygen 2L/[place]. Her appetite has been good according to patient and family. Due to increased  weakness the family has purchased a transport chair and also built a ramp onto the home.   We discussed her current illness and what it means in the larger context of her on-going co-morbidities.  Natural disease trajectory and expectations at EOL were discussed. Patient and family verbalized their understanding of her current conditions and it's severity.   I attempted to elicit values and goals of care important to the patient.    The difference between aggressive medical intervention and comfort care was considered in light of the patient's goals of care. Patient would like to continue to treat the treatable at this time. She verbalizes her awareness that her body may be getting tired, but she isn't quite ready  to give in if she can help it.   Advanced directives, concepts specific to code status, artifical feeding and hydration, and rehospitalization were considered and discussed. Patient states she has a living will and that her daughters are her health care POA. We discussed in detail her current FULL CODE status. At this time she is uncertain what she would want in an emergent life threatening situation. She states she wants to go when God calls her but then states she wants CPR to see if God will let her live longer. Her daughters verbalized their confusion as they have not discussed this before. Patient states let her think about it and support was given. We did discuss what a code could potentially look like best case worst case with her conditions. Her and the family will continue discussion amongst each other.   Hospice and Palliative Care services outpatient were explained and offered. Patient and family have requested to have outpatient palliative at discharge. With awareness that they can transition to hospice once they feel they are ready and she is appropriate.   Questions and concerns were addressed.  Hard Choices booklet left for review. Directed patient and family to read chapter regarding palliative services as well as CPR and medical interventions. The family was encouraged to call with questions or concerns.  PMT will continue to support holistically.  Primary Decision Maker: Patient is capable of making medical decisions. She is alert and oriented x3. However, if she is unable to make decisions she verbalizes that her 2 daughters are her HCPOA: Games developer and [name].   HCPOA    SUMMARY OF RECOMMENDATIONS    FULL CODE-patient and family will continue discussion. Patient is not 100% sure of her wishes in regards to intubation. Hard choices book given to her and the family to read and use during their private discussion.   Continue to treat the treatable. Patient aware her  health is not improving much and would like Palliative services. With awareness she may transition to hospice once she feels necessary.   CSW consult for outpatient palliative at patient and family's request.   Palliative Medicine Team will continue to support patient, family, and medical team during hospitalization.   Code Status/Advance Care Planning:  Full code  Palliative Prophylaxis:   Aspiration, Bowel Regimen, Delirium Protocol, Frequent Pain Assessment and Oral Care  Additional Recommendations (Limitations, Scope, Preferences):  Full Scope Treatment  Psycho-social/Spiritual:   Desire for further Chaplaincy support: NO   Prognosis:   Unable to determine-guarded to poor in the setting of decreased mobility, deconditioning, COPD, chronic home oxygen use, renal insufficiency, hypertension, diabetes, chronic back pain, chronic pain medication use, generalized weakness, sepis, and albumin 2.4.   Discharge Planning: [place] for rehab with Palliative care service  follow-up      Primary Diagnoses: Present on Admission: . Sepsis ([place])   I have reviewed the medical record, interviewed the patient and family, and examined the patient. The following aspects are pertinent.  Past Medical History:  Diagnosis Date  . Arthritis    all over  . Back pain   . COPD (chronic obstructive pulmonary disease) (HCC)    O2 use continuosly at 2 liters/ some  asthma symptoms  . Diabetes mellitus without complication (HCC)    diet controlled  . GERD (gastroesophageal reflux disease)   . Heart murmur    lifelong  . Hypercholesterolemia   . Hypertension    controlled on meds  . Pneumonia 2015   [facility] hospitalized  . Renal insufficiency    early stage kidney failure  . Shortness of breath dyspnea   . Sleep apnea    2nd sleep study said pt does not have sleep apnea  . Swelling    of feet and legs   Social History   Socioeconomic History  . Marital status: Married     Spouse name: Not on file  . Number of children: Not on file  . Years of education: Not on file  . Highest education level: Not on file  Occupational History  . Not on file  Social Needs  . Financial resource strain: Not on file  . Food insecurity:    Worry: Not on file    Inability: Not on file  . Transportation needs:    Medical: Not on file    Non-medical: Not on file  Tobacco Use  . Smoking status: Current Every Day Smoker    Packs/day: 0.25    Years: 50.00    Pack years: 12.50    Types: Cigarettes  . Smokeless tobacco: Never Used  Substance and Sexual Activity  . Alcohol use: Yes    Alcohol/week: 1.8 oz    Types: 3 Glasses of wine per week    Comment: rarely  . Drug use: No  . Sexual activity: Not on file  Lifestyle  . Physical activity:    Days per week: Not on file    Minutes per session: Not on file  . Stress: Not on file  Relationships  . Social connections:    Talks on phone: Not on file    Gets together: Not on file    Attends religious service: Not on file    Active member of club or organization: Not on file    Attends meetings of clubs or organizations: Not on file    Relationship status: Not on file  Other Topics Concern  . Not on file  Social History Narrative  . Not on file   Family History  Problem Relation Age of Onset  . Breast cancer Mother 73  . Breast cancer Paternal Grandmother 100   Scheduled Meds: . allopurinol  200 mg Oral Daily  . bisacodyl  10 mg Oral Daily  . calcium carbonate  1,000 mg Oral TID WC  . docusate sodium  100 mg Oral BID  . enoxaparin (LOVENOX) injection  40 mg Subcutaneous Q24H  . ezetimibe  10 mg Oral QHS  . feeding supplement (ENSURE ENLIVE)  237 mL Oral BID BM  . folic acid  1 mg Oral Daily  . gabapentin  400 mg Oral TID  . insulin aspart  0-15 Units Subcutaneous TID WC  . insulin aspart  0-5 Units Subcutaneous QHS  . magnesium oxide  400 mg Oral BID  .  metoprolol succinate  100 mg Oral Daily  .  mometasone-formoterol  2 puff Inhalation BID  . multivitamin with minerals  1 tablet Oral Daily  . nicotine  14 mg Transdermal Daily  . oxyCODONE  15 mg Oral Q6H  . pantoprazole  40 mg Oral QAC breakfast  . polyethylene glycol  17 g Oral Daily  . pramipexole  1 mg Oral QHS  . senna-docusate  2 tablet Oral BID  . theophylline  400 mg Oral Daily  . thiamine  100 mg Oral Daily   Or  . thiamine  100 mg Intravenous Daily  . vitamin C  250 mg Oral BID  . Vitamin D (Ergocalciferol)  50,000 Units Oral Q14 Days   Continuous Infusions: . vancomycin Stopped (08/25/17 0300)   PRN Meds:.acetaminophen **OR** [DISCONTINUED] acetaminophen, guaiFENesin, ipratropium-albuterol, LORazepam, LORazepam, [DISCONTINUED] ondansetron **OR** ondansetron (ZOFRAN) IV, oxyCODONE Medications Prior to Admission:  Prior to Admission medications   Medication Sig Start Date End Date Taking? Authorizing Provider  albuterol (PROVENTIL HFA;VENTOLIN HFA) 108 (90 Base) MCG/ACT inhaler Inhale 2 puffs into the lungs every 6 (six) hours as needed for wheezing or shortness of breath.   Yes [provider]  allopurinol (ZYLOPRIM) 100 MG tablet Take 200 mg by mouth daily.    Yes [provider]  aspirin EC 81 MG tablet Take 81 mg by mouth daily.    Yes [provider]  diclofenac (FLECTOR) 1.3 % PTCH Place 1 patch onto the skin daily as needed (for pain).   Yes [provider]  ezetimibe (ZETIA) 10 MG tablet Take 10 mg by mouth at bedtime.   Yes [provider]  felodipine (PLENDIL) 10 MG 24 hr tablet Take 10 mg by mouth at bedtime.   Yes [provider]  fluticasone (FLONASE) 50 MCG/ACT nasal spray Place 2 sprays into both nostrils daily as needed for allergies. 12/08/16  Yes [provider]  fluticasone-salmeterol (ADVAIR HFA) 115-21 MCG/ACT inhaler Inhale 2 puffs into the lungs 2 (two) times daily.    Yes [provider]  furosemide (LASIX) 40 MG tablet Take  80 mg by mouth daily.    Yes [provider]  gabapentin (NEURONTIN) 300 MG capsule Take 1 capsule (300MG) by mouth every morning and lunchtime and 2 capsules (600MG) at bedtime 06/09/17  Yes [provider]  guaiFENesin (MUCINEX) 600 MG 12 hr tablet Take 600 mg by mouth every 12 (twelve) hours as needed for cough.   Yes [provider]  metoprolol succinate (TOPROL-XL) 100 MG 24 hr tablet Take 100 mg by mouth daily.    Yes [provider]  nicotine (NICODERM CQ - DOSED IN MG/24 HOURS) 14 mg/24hr patch APP 1 PA EXT TO THE SKIN D 07/01/17  Yes [provider]  omeprazole (PRILOSEC) 40 MG capsule Take 40 mg by mouth daily.    Yes [provider]  oxyCODONE (ROXICODONE) 15 MG immediate release tablet Take 15 mg by mouth 6 (six) times daily.    Yes [provider]  pramipexole (MIRAPEX) 0.5 MG tablet Take 1 mg by mouth at bedtime.    Yes [provider]  theophylline (THEO-24) 400 MG 24 hr capsule Take 400 mg by mouth daily.    Yes [provider]  tiotropium (SPIRIVA) 18 MCG inhalation capsule Place 18 mcg into inhaler and inhale daily.    Yes [provider]  Vitamin D, Ergocalciferol, (DRISDOL) 50000 UNITS CAPS capsule Take 50,000 Units by mouth every 14 (fourteen) days.  Yes [provider]   Allergies  Allergen Reactions  . Tequin [Gatifloxacin] Shortness Of Breath  . Bupropion     Other reaction(s): Other (See Comments) shaking  . Keflex [Cephalexin] Other (See Comments)    Unknown  . Lisinopril Other (See Comments)    Unknown  . Varenicline Nausea Only and Nausea And Vomiting   Review of Systems  Constitutional: Positive for fatigue.  Respiratory: Positive for cough and shortness of breath.   Neurological: Positive for weakness.  All other systems reviewed and are negative.   Physical Exam  Constitutional: Vital signs are normal. She is cooperative. She has a sickly appearance.    Cardiovascular: Normal rate, regular rhythm and normal pulses.  Murmur heard. Pulmonary/Chest: She has decreased breath sounds.  Shortness of breath at times during conversation   Abdominal: Normal appearance and bowel sounds are normal.  Musculoskeletal:  Generalized weakness   Neurological: She is alert.  Skin: Skin is warm and dry.  Some edema to right arm   Psychiatric: She has a normal mood and affect. Her speech is normal and behavior is normal. Judgment and thought content normal. Cognition and memory are normal. Abnormal remote memory:   Nursing note and vitals reviewed.   Vital Signs: BP (!) 106/57 (BP Location: Left Arm)   Pulse 81   Temp (!) 97.4 F (36.3 C) (Oral)   Resp 19   Ht 4' 11" (1.499 m)   Wt 84.9 kg (187 lb 3.2 oz)   SpO2 90%   BMI 37.81 kg/m  Pain Scale: 0-10 POSS *See Group Information*: S-Acceptable,Sleep, easy to arouse Pain Score: 8    SpO2: SpO2: 90 % O2 Device:SpO2: 90 % O2 Flow Rate: .O2 Flow Rate (L/min): 3 L/min  IO: Intake/output summary:   Intake/Output Summary (Last 24 hours) at 08/25/2017 1450 Last data filed at 08/25/2017 0300 Gross per 24 hour  Intake 203.33 ml  Output 1601 ml  Net -1397.67 ml    LBM: Last BM Date: 08/25/17 Baseline Weight: Weight: 83.9 kg (185 lb) Most recent weight: Weight: 84.9 kg (187 lb 3.2 oz)     Palliative Assessment/Data: PPS 40%   Time In: 1330 Time Out: 1445 Time Total: 75 min.   Greater than 50%  of this time was spent counseling and coordinating care related to the above assessment and plan.  Signed by:  Alda Lea, NP-BC Palliative Medicine Team  Phone: [phone] Fax: [phone] Pager: [phone] Amion: Bjorn Pippin    Please contact Palliative Medicine Team phone at [phone] for questions and concerns.  For individual provider: See Shea Evans

## 2017-08-26 LAB — MAGNESIUM: Magnesium: 1.9 mg/dL (ref 1.7–2.4)

## 2017-08-26 LAB — AEROBIC/ANAEROBIC CULTURE W GRAM STAIN (SURGICAL/DEEP WOUND)

## 2017-08-26 LAB — GLUCOSE, CAPILLARY
GLUCOSE-CAPILLARY: 110 mg/dL — AB (ref 70–99)
Glucose-Capillary: 73 mg/dL (ref 70–99)
Glucose-Capillary: 123 mg/dL — ABNORMAL HIGH (ref 70–99)

## 2017-08-26 LAB — BASIC METABOLIC PANEL
Anion gap: 7 (ref 5–15)
BUN: 14 mg/dL (ref 8–23)
CHLORIDE: 111 mmol/L (ref 98–111)
CO2: 23 mmol/L (ref 22–32)
CREATININE: 0.78 mg/dL (ref 0.44–1.00)
Calcium: 6.5 mg/dL — ABNORMAL LOW (ref 8.9–10.3)
GFR calc non Af Amer: >60 mL/min (ref >60)
GLUCOSE: 114 mg/dL — AB (ref 70–99)
Potassium: 5.7 mmol/L — ABNORMAL HIGH (ref 3.5–5.1)
SODIUM: 141 mmol/L (ref 135–145)

## 2017-08-26 LAB — PHOSPHORUS: Phosphorus: 2.9 mg/dL (ref 2.5–4.6)

## 2017-08-26 LAB — POTASSIUM: Potassium: 5.1 mmol/L (ref 3.5–5.1)

## 2017-08-26 LAB — AEROBIC/ANAEROBIC CULTURE (SURGICAL/DEEP WOUND): GRAM STAIN: NONE SEEN

## 2017-08-26 MED ORDER — SULFAMETHOXAZOLE-TRIMETHOPRIM 800-160 MG PO TABS
1.0000 | ORAL_TABLET | Freq: Two times a day (BID) | ORAL | Status: DC
  Administered 2017-08-26: 1 via ORAL
  Filled 2017-08-26 (×2): qty 1

## 2017-08-26 MED ORDER — CALCIUM CARBONATE ANTACID 500 MG PO CHEW: 1000.0000 mg | CHEWABLE_TABLET | Freq: Three times a day (TID) | ORAL | Status: AC

## 2017-08-26 MED ORDER — SULFAMETHOXAZOLE-TRIMETHOPRIM 800-160 MG PO TABS: 1.0000 | ORAL_TABLET | Freq: Two times a day (BID) | ORAL | 0 refills | Status: DC

## 2017-08-26 MED ORDER — FOLIC ACID 1 MG PO TABS: 1.0000 mg | ORAL_TABLET | Freq: Every day | ORAL | Status: AC

## 2017-08-26 MED ORDER — DEXTROSE 50 % IV SOLN
25.0000 mL | Freq: Once | INTRAVENOUS | Status: AC
  Administered 2017-08-26: 25 mL via INTRAVENOUS
  Filled 2017-08-26: qty 50

## 2017-08-26 MED ORDER — GABAPENTIN 400 MG PO CAPS: 400.0000 mg | ORAL_CAPSULE | Freq: Three times a day (TID) | ORAL | Status: DC

## 2017-08-26 MED ORDER — OXYCODONE HCL 15 MG PO TABS: 15.0000 mg | ORAL_TABLET | Freq: Four times a day (QID) | ORAL | 0 refills | Status: DC

## 2017-08-26 MED ORDER — INSULIN ASPART 100 UNIT/ML IV SOLN
10.0000 [IU] | Freq: Once | INTRAVENOUS | Status: AC
  Administered 2017-08-26: 10 [IU] via INTRAVENOUS
  Filled 2017-08-26: qty 0.1

## 2017-08-26 MED ORDER — BISACODYL 5 MG PO TBEC: 10.0000 mg | DELAYED_RELEASE_TABLET | Freq: Every day | ORAL | 0 refills | Status: AC

## 2017-08-26 MED ORDER — DOCUSATE SODIUM 100 MG PO CAPS: 100.0000 mg | ORAL_CAPSULE | Freq: Two times a day (BID) | ORAL | 0 refills | Status: AC

## 2017-08-26 NOTE — Progress Notes (Signed)
Patient just voided at 1720 after foley catheter removal. Post void bladder scan was 162, also had a bowel movement. Updated and called Marylene LandAngela, RN at [facility]. Called EMS for transport. Patient's daughter at bedside and updated about the plan. RN will continue to monitor.

## 2017-08-26 NOTE — Progress Notes (Signed)
Daily Progress Note   Patient Name: Brenda Glenn       Date: 08/26/2017 DOB: 1940-05-14  Age: 77 y.o. MRN#: [id number] Attending Physician: Demetrios Loll, MD Primary Care Physician: Ezequiel Kayser, MD Admit Date: 08/15/2017  Reason for Consultation/Follow-up: Establishing goals of care  Subjective: Patient is awake in bed preparing to eat breakfast. She is alert & oriented x3. Daughter is at the bedside. Patient denies pain or discomfort. Patient and daughter verbalizes the plan is to discharge today to [facility]. Daughter worried that patient will continue to have urinary retention due to foley still being in place. Discussed with Dr. Bridgett Larsson and Caryl Pina, RN. RN is preparing to remove foley. Daughter updated and she is aware patient will need to void without difficulty prior to discharge. Also discharge is pending until a repeat potassium is drawn and resulted. Per Dr. Bridgett Larsson would like to see potassium level decrease from 5.7. Daughter also aware. Family and patient has no further questions. They verbalize the plan and expectations to be met prior to discharge and that Palliative will be involved in her care in the outpatient.   Chart Reviewed and report received from Bedside, RN.   Length of Stay: 11  Current Medications: Scheduled Meds:  . allopurinol  200 mg Oral Daily  . bisacodyl  10 mg Oral Daily  . calcium carbonate  1,000 mg Oral TID WC  . docusate sodium  100 mg Oral BID  . enoxaparin (LOVENOX) injection  40 mg Subcutaneous Q24H  . ezetimibe  10 mg Oral QHS  . feeding supplement (ENSURE ENLIVE)  237 mL Oral BID BM  . folic acid  1 mg Oral Daily  . gabapentin  400 mg Oral TID  . insulin aspart  0-15 Units Subcutaneous TID WC  . insulin aspart  0-5 Units Subcutaneous QHS  .  metoprolol succinate  100 mg Oral Daily  . mometasone-formoterol  2 puff Inhalation BID  . multivitamin with minerals  1 tablet Oral Daily  . nicotine  14 mg Transdermal Daily  . oxyCODONE  15 mg Oral Q6H  . pantoprazole  40 mg Oral QAC breakfast  . polyethylene glycol  17 g Oral Daily  . pramipexole  1 mg Oral QHS  . senna-docusate  2 tablet Oral BID  . sulfamethoxazole-trimethoprim  1 tablet Oral  Q12H  . theophylline  400 mg Oral Daily  . thiamine  100 mg Oral Daily   Or  . thiamine  100 mg Intravenous Daily  . vitamin C  250 mg Oral BID  . Vitamin D (Ergocalciferol)  50,000 Units Oral Q14 Days    Continuous Infusions:   PRN Meds: acetaminophen **OR** [DISCONTINUED] acetaminophen, guaiFENesin, ipratropium-albuterol, LORazepam, LORazepam, [DISCONTINUED] ondansetron **OR** ondansetron (ZOFRAN) IV, oxyCODONE  Physical Exam      Constitutional: Vital signs are normal. She is cooperative.  Cardiovascular: Normal rate, regular rhythm and normal pulses.  Murmur heard. Pulmonary/Chest: She has decreased breath sounds.  Shortness of breath at times during conversation   Abdominal: Normal appearance and bowel sounds are normal.  Musculoskeletal:  Generalized weakness   Neurological: She is alert.  Skin: Skin is warm and dry.  Some edema to right arm   Psychiatric: She has a normal mood and affect. Her speech is normal and behavior is normal. Judgment and thought content normal. Cognition and memory are normal. Abnormal remote memory:   Nursing note and vitals reviewed.   Vital Signs: BP 116/65 (BP Location: Left Arm)   Pulse 83   Temp 98 F (36.7 C) (Oral)   Resp 18   Ht _0  (1.499 m)   Wt 84.9 kg (187 lb 3.2 oz)   SpO2 96%   BMI 37.81 kg/m  SpO2: SpO2: 96 % O2 Device: O2 Device: Nasal Cannula O2 Flow Rate: O2 Flow Rate (L/min): 3 L/min  Intake/output summary:   Intake/Output Summary (Last 24 hours) at 08/26/2017 1221 Last data filed at 08/26/2017 0142 Gross per 24  hour  Intake 200 ml  Output 1100 ml  Net -900 ml   LBM: Last BM Date: 08/25/17 Baseline Weight: Weight: 83.9 kg (185 lb) Most recent weight: Weight: 84.9 kg (187 lb 3.2 oz)       Palliative Assessment/Data: PPS 40%      Patient Active Problem List   Diagnosis Date Noted  . Restless leg syndrome 08/21/2017  . Perianal abscess   . Sepsis ([place]) 08/15/2017  . Bilateral lower extremity edema 07/28/2017  . Lower extremity numbness 07/28/2017  . ARF (acute renal failure) ([place]) 06/20/2017  . 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 Lymphedema 01/06/2016  . Chronic venous insufficiency 01/06/2016  . Pain in limb 01/06/2016  . COPD (chronic obstructive pulmonary disease) ([place]) 01/06/2016  . Abnormality of gait 01/06/2016    Palliative Care Assessment & Plan   Patient Profile: 77 y.o. female admitted on 08/15/2017 from home with complaints of weakness and urinary retention. She has a past medical history significant for osteoarthritis, COPD (2L home use), diabetes, GERD, hypertension, heart murmur, back pain, and hypercholesterolemia. During ED course patient reports symptoms have been going on for more than 2 weeks. She was found to have a UTI and signs of sepsis. She received fluid boluses due to hypotension. She denied chest pain, nausea, vomiting, or diarrhea. She does endorse chronic shortness of breath and cough which is baseline, as well as burning and urinary retention. Since admission she has received fluid and electrolyte replacement and s/p I&D of a peri anal horseshoe abcess which was MRSA positive. She continues to exhibit signs of weakness and deconditioning. Palliative Medicine team consulted for goals of care discussion.    Recommendations/Plan:  FULL CODE-at patient/family request  Continue to treat the treatable. Patient aware her health is not improving much and would like Palliative services. With awareness she may transition to hospice  once she feels necessary.     CSW and Santiago Glad, RN [facility] liaison) aware of outpatient Palliative needs at [facility].   Palliative Medicine Team will continue to support patient, family, and medical team during hospitalization.   Goals of Care and Additional Recommendations:  Limitations on Scope of Treatment: Full Scope Treatment  Code Status:    Code Status Orders  (From admission, onward)        Start     Ordered   08/15/17 2030  Full code  Continuous     08/15/17 2029    Code Status History    Date Active Date Inactive Code Status Order ID Comments User Context   06/20/2017 1157 06/21/2017 1837 DNR [id number]  Demetrios Loll, MD Inpatient   01/08/2017 1422 01/09/2017 1959 Full Code [id number]  Leim Fabry, MD Inpatient       Prognosis:   Unable to determine-guarded to poor in the setting of decreased mobility, deconditioning, COPD, chronic home oxygen use, renal insufficiency, hypertension, diabetes, chronic back pain, chronic pain medication use, generalized weakness, sepis, and albumin 2.4.   Discharge Planning:  [place] for rehab with Palliative care service follow-up  Care plan was discussed with patient, patient's family, and bedside RN.   Thank you for allowing the Palliative Medicine Team to assist in the care of this patient.   Total Time 35 min.  Prolonged Time Billed NO       Greater than 50%  of this time was spent counseling and coordinating care related to the above assessment and plan.  Alda Lea, NP  Please contact Palliative Medicine Team phone at [phone] for questions and concerns.

## 2017-08-26 NOTE — Progress Notes (Signed)
Chaplain was rounding spoke to RN about visitation. Pt was resting with daughter at bedside. Chaplain introduced himself and let her know we are available if needed. Pt may be discharged today.    08/26/17 1300  Clinical Encounter Type  Visited With Patient  Visit Type Initial

## 2017-08-26 NOTE — Clinical Social Work Note (Signed)
Patient to be d/c'ed today to [facility] room 405.  Patient and family agreeable to plans will transport via ems RN to call report to [phone].  Windell MouldingEric Itzia Cunliffe, MSW, Theresia MajorsLCSWA [phone]

## 2017-08-26 NOTE — Clinical Social Work Placement (Signed)
   CLINICAL SOCIAL WORK PLACEMENT  NOTE  Date:  08/26/2017  Patient Details  Name: Brenda Glenn MRN: [id number] Date of Birth: 1940-11-14  Clinical Social Work is seeking post-discharge placement for this patient at the Skilled  Nursing Facility level of care (*CSW will initial, date and re-position this form in  chart as items are completed):  Yes   Patient/family provided with [place] Clinical Social Work Department's list of facilities offering this level of care within the geographic area requested by the patient (or if unable, by the patient's family).  Yes   Patient/family informed of their freedom to choose among providers that offer the needed level of care, that participate in Medicare, Medicaid or managed care program needed by the patient, have an available bed and are willing to accept the patient.  Yes   Patient/family informed of [facility]'s ownership interest in [facility] and [facility], as well as of the fact that they are under no obligation to receive care at these facilities.  PASRR submitted to EDS on 08/20/17     PASRR number received on 08/20/17     Existing PASRR number confirmed on       FL2 transmitted to all facilities in geographic area requested by pt/family on 08/20/17     FL2 transmitted to all facilities within larger geographic area on       Patient informed that his/her managed care company has contracts with or will negotiate with certain facilities, including the following:        Yes   Patient/family informed of bed offers received.  Patient chooses bed at [facility] [place]     Physician recommends and patient chooses bed at      Patient to be [facility] [place] on 08/26/17.  [facility]     Patient family notified on 08/26/17 of transfer.  Name of family member notified:  Daughter was at bedside and is aware that patient is discharging  today.     PHYSICIAN Please sign FL2     Additional Comment:    _______________________________________________ Darleene CleaverAnterhaus, [name], LCSWA 08/26/2017, 1:53 PM

## 2017-08-26 NOTE — Progress Notes (Signed)
Report called to [facility].

## 2017-08-26 NOTE — Discharge Summary (Addendum)
[employer] [facility] [place] at [facility]   PATIENT NAME: Brenda Glenn    MR#:  [id number]  DATE OF BIRTH:  05-13-1940  DATE OF ADMISSION:  08/15/2017   ADMITTING PHYSICIAN: Auburn Bilberry, MD  DATE OF DISCHARGE: 08/26/2017 PRIMARY CARE PHYSICIAN: Mickey Farber, MD   ADMISSION DIAGNOSIS:  Encounter for central line placement [Z45.2] Sepsis, due to unspecified organism (HCC) [A41.9] Urinary tract infection without hematuria, site unspecified [N39.0] Sepsis (HCC) [A41.9] DISCHARGE DIAGNOSIS:  Principal Problem:   Restless leg syndrome Active Problems:   Sepsis (HCC)   Perianal abscess  SECONDARY DIAGNOSIS:   Past Medical History:  Diagnosis Date  . Arthritis    all over  . Back pain   . COPD (chronic obstructive pulmonary disease) (HCC)    O2 use continuosly at 2 liters/ some  asthma symptoms  . Diabetes mellitus without complication (HCC)    diet controlled  . GERD (gastroesophageal reflux disease)   . Heart murmur    lifelong  . Hypercholesterolemia   . Hypertension    controlled on meds  . Pneumonia 2015   [facility] hospitalized  . Renal insufficiency    early stage kidney failure  . Shortness of breath dyspnea   . Sleep apnea    2nd sleep study said pt does not have sleep apnea  . Swelling    of feet and legs   HOSPITAL COURSE:  Patient is 77 year old white female with COPD chronic respiratory failure with sepsis  * Peri anal horseshoe abscess Status post I&D of the abscess on June 21  Wound culture from the abscess with MRSA He has been treated with Vanco, change to p.o. Bactrim after discharge.  Follow-up Dr. Everlene Farrier tomorrow.  * Septic shock due to UTI Septic shock resolved Urine culture -Proteus & E.coli Blood cultures with coagulase-negative Staphylococcus, likely contaminant - recheck 2 sets again on 6/24 -negative so far Stopped ancef.  *Hyperkalemia  Given NovoLog D50, follow-up potassium 5.1.  *Hypocalcemia with muscle  cramps also has associated hyper album anemia. Replaced and calcium is improving.  *Chronic kidney disease 3 Better than baseline.  *Diabetes type 2: continue sliding scale insulin  *Chronic respiratory failure with COPD,  continue oxygen and inhalers as doing at home  *Hyperlipidemia continue home regimen  She is very weak even at baseline, barely goes from bed to bathroom at home per sister.  Family is agreeable for HHPT, RN, per palliative care c/s. She may benefit from outpt Palliative care and transition to Hospice if and when she qualifies  She will be discharged to SNF with palliative care follow-up. DISCHARGE CONDITIONS:  Stable, discharge to SNF with palliative care follow-up. CONSULTS OBTAINED:  Treatment Team:  Clapacs, Jackquline Denmark, MD DRUG ALLERGIES:   Allergies  Allergen Reactions  . Tequin [Gatifloxacin] Shortness Of Breath  . Bupropion     Other reaction(s): Other (See Comments) shaking  . Keflex [Cephalexin] Other (See Comments)    Unknown  . Lisinopril Other (See Comments)    Unknown  . Varenicline Nausea Only and Nausea And Vomiting   DISCHARGE MEDICATIONS:   Allergies as of 08/26/2017      Reactions   Tequin [gatifloxacin] Shortness Of Breath   Bupropion    Other reaction(s): Other (See Comments) shaking   Keflex [cephalexin] Other (See Comments)   Unknown   Lisinopril Other (See Comments)   Unknown   Varenicline Nausea Only, Nausea And Vomiting      Medication List    STOP taking  these medications   furosemide 40 MG tablet Commonly known as:  LASIX     TAKE these medications   albuterol 108 (90 Base) MCG/ACT inhaler Commonly known as:  PROVENTIL HFA;VENTOLIN HFA Inhale 2 puffs into the lungs every 6 (six) hours as needed for wheezing or shortness of breath.   allopurinol 100 MG tablet Commonly known as:  ZYLOPRIM Take 200 mg by mouth daily.   aspirin EC 81 MG tablet Take 81 mg by mouth daily.   bisacodyl 5 MG EC  tablet Commonly known as:  DULCOLAX Take 2 tablets (10 mg total) by mouth daily. Start taking on:  08/27/2017   calcium carbonate 500 MG chewable tablet Commonly known as:  TUMS - dosed in mg elemental calcium Chew 2 tablets (1,000 mg total) by mouth 3 (three) times daily with meals.   diclofenac 1.3 % Ptch Commonly known as:  FLECTOR Place 1 patch onto the skin daily as needed (for pain).   docusate sodium 100 MG capsule Commonly known as:  COLACE Take 1 capsule (100 mg total) by mouth 2 (two) times daily.   ezetimibe 10 MG tablet Commonly known as:  ZETIA Take 10 mg by mouth at bedtime.   felodipine 10 MG 24 hr tablet Commonly known as:  PLENDIL Take 10 mg by mouth at bedtime.   fluticasone 50 MCG/ACT nasal spray Commonly known as:  FLONASE Place 2 sprays into both nostrils daily as needed for allergies.   fluticasone-salmeterol 115-21 MCG/ACT inhaler Commonly known as:  ADVAIR HFA Inhale 2 puffs into the lungs 2 (two) times daily.   folic acid 1 MG tablet Commonly known as:  FOLVITE Take 1 tablet (1 mg total) by mouth daily. Start taking on:  08/27/2017   gabapentin 400 MG capsule Commonly known as:  NEURONTIN Take 1 capsule (400 mg total) by mouth 3 (three) times daily. What changed:    medication strength  how much to take  how to take this  when to take this  additional instructions   metoprolol succinate 100 MG 24 hr tablet Commonly known as:  TOPROL-XL Take 100 mg by mouth daily.   MUCINEX 600 MG 12 hr tablet Generic drug:  guaiFENesin Take 600 mg by mouth every 12 (twelve) hours as needed for cough.   nicotine 14 mg/24hr patch Commonly known as:  NICODERM CQ - dosed in mg/24 hours APP 1 PA EXT TO THE SKIN D   omeprazole 40 MG capsule Commonly known as:  PRILOSEC Take 40 mg by mouth daily.   oxyCODONE 15 MG immediate release tablet Commonly known as:  ROXICODONE Take 1 tablet (15 mg total) by mouth every 6 (six) hours. What changed:  when  to take this   pramipexole 0.5 MG tablet Commonly known as:  MIRAPEX Take 1 mg by mouth at bedtime.   sulfamethoxazole-trimethoprim 800-160 MG tablet Commonly known as:  BACTRIM DS,SEPTRA DS Take 1 tablet by mouth every 12 (twelve) hours.   theophylline 400 MG 24 hr capsule Commonly known as:  THEO-24 Take 400 mg by mouth daily.   tiotropium 18 MCG inhalation capsule Commonly known as:  SPIRIVA Place 18 mcg into inhaler and inhale daily.   Vitamin D (Ergocalciferol) 50000 units Caps capsule Commonly known as:  DRISDOL Take 50,000 Units by mouth every 14 (fourteen) days.        DISCHARGE INSTRUCTIONS:  See AVS. If you experience worsening of your admission symptoms, develop shortness of breath, life threatening emergency, suicidal or homicidal thoughts you  must seek medical attention immediately by calling 911 or calling your MD immediately  if symptoms less severe.  You Must read complete instructions/literature along with all the possible adverse reactions/side effects for all the Medicines you take and that have been prescribed to you. Take any new Medicines after you have completely understood and accpet all the possible adverse reactions/side effects.   Please note  You were cared for by a hospitalist during your hospital stay. If you have any questions about your discharge medications or the care you received while you were in the hospital after you are discharged, you can call the unit and asked to speak with the hospitalist on call if the hospitalist that took care of you is not available. Once you are discharged, your primary care physician will handle any further medical issues. Please note that NO REFILLS for any discharge medications will be authorized once you are discharged, as it is imperative that you return to your primary care physician (or establish a relationship with a primary care physician if you do not have one) for your aftercare needs so that they can  reassess your need for medications and monitor your lab values.    On the day of Discharge:  VITAL SIGNS:  Blood pressure 116/65, pulse 83, temperature 98 F (36.7 C), temperature source Oral, resp. rate 18, height 4\' 11"  (1.499 m), weight 187 lb 3.2 oz (84.9 kg), SpO2 96 %. PHYSICAL EXAMINATION:  GENERAL:  77 y.o.-year-old patient lying in the bed with no acute distress.  Obesity, oxygen by nasal cannula. EYES: Pupils equal, round, reactive to light and accommodation. No scleral icterus. Extraocular muscles intact.  HEENT: Head atraumatic, normocephalic. Oropharynx and nasopharynx clear.  NECK:  Supple, no jugular venous distention. No thyroid enlargement, no tenderness.  LUNGS: Normal breath sounds bilaterally, no wheezing, rales,rhonchi or crepitation. No use of accessory muscles of respiration.  CARDIOVASCULAR: S1, S2 normal. No murmurs, rubs, or gallops.  ABDOMEN: Soft, non-tender, non-distended. Bowel sounds present. No organomegaly or mass.  EXTREMITIES: No pedal edema, cyanosis, or clubbing.  NEUROLOGIC: Cranial nerves II through XII are intact. Muscle strength 3-4/5 in all extremities. Sensation intact. Gait not checked.  PSYCHIATRIC: The patient is alert and oriented x 3.  SKIN: No obvious rash, lesion, or ulcer.  DATA REVIEW:   CBC Recent Labs  Lab 08/25/17 0451  WBC 9.4  HGB 9.6*  HCT 30.0*  PLT 374    Chemistries  Recent Labs  Lab 08/26/17 0457  NA 141  K 5.7*  CL 111  CO2 23  GLUCOSE 114*  BUN 14  CREATININE 0.78  CALCIUM 6.5*  MG 1.9     Microbiology Results  Results for orders placed or performed during the hospital encounter of 08/15/17  Urine Culture     Status: Abnormal   Collection Time: 08/15/17  2:30 PM  Result Value Ref Range Status   Specimen Description   Final    URINE, RANDOM Performed at [facility], [address] [place], [place] [address]    Special Requests   Final    Normal Performed at [facility], [address] [place], [place] [address]    Culture (A)  Final    >=100,000 COLONIES/mL PROTEUS MIRABILIS 80,000 COLONIES/mL ESCHERICHIA COLI    Report Status 08/21/2017 FINAL  Final   Organism ID, Bacteria PROTEUS MIRABILIS (A)  Final   Organism ID, Bacteria ESCHERICHIA COLI (A)  Final      Susceptibility   Escherichia  coli - MIC*    AMPICILLIN >=32 RESISTANT Resistant     CEFAZOLIN <=4 SENSITIVE Sensitive     CEFTRIAXONE <=1 SENSITIVE Sensitive     CIPROFLOXACIN 1 SENSITIVE Sensitive     GENTAMICIN <=1 SENSITIVE Sensitive     IMIPENEM <=0.25 SENSITIVE Sensitive     NITROFURANTOIN 64 INTERMEDIATE Intermediate     TRIMETH/SULFA >=320 RESISTANT Resistant     AMPICILLIN/SULBACTAM >=32 RESISTANT Resistant     PIP/TAZO <=4 SENSITIVE Sensitive     Extended ESBL NEGATIVE Sensitive     * 80,000 COLONIES/mL ESCHERICHIA COLI   Proteus mirabilis - MIC*    AMPICILLIN <=2 SENSITIVE Sensitive     CEFAZOLIN <=4 SENSITIVE Sensitive     CEFTRIAXONE <=1 SENSITIVE Sensitive     CIPROFLOXACIN <=0.25 SENSITIVE Sensitive     GENTAMICIN <=1 SENSITIVE Sensitive     IMIPENEM 2 SENSITIVE Sensitive     NITROFURANTOIN 128 RESISTANT Resistant     TRIMETH/SULFA <=20 SENSITIVE Sensitive     AMPICILLIN/SULBACTAM <=2 SENSITIVE Sensitive     PIP/TAZO <=4 SENSITIVE Sensitive     * >=100,000 COLONIES/mL PROTEUS MIRABILIS  Blood culture (single)     Status: Abnormal   Collection Time: 08/15/17  3:12 PM  Result Value Ref Range Status   Specimen Description   Final    BLOOD LEFT CHEST Performed at [facility], [address] [place], [place] [address]    Special Requests   Final    BOTTLES DRAWN AEROBIC AND ANAEROBIC Blood Culture adequate volume Performed at [facility], [address] [place], [place] [address]    Culture  Setup Time   Final    GRAM POSITIVE COCCI AEROBIC BOTTLE ONLY CRITICAL RESULT CALLED TO, READ BACK BY AND VERIFIED WITH: JASON ROBBINS 08/16/17 AT 1906 BY HS     Culture (A)  Final    STAPHYLOCOCCUS SPECIES (COAGULASE NEGATIVE) THE SIGNIFICANCE OF ISOLATING THIS ORGANISM FROM A SINGLE SET OF BLOOD CULTURES WHEN MULTIPLE SETS ARE DRAWN IS UNCERTAIN. PLEASE NOTIFY THE MICROBIOLOGY DEPARTMENT WITHIN ONE WEEK IF SPECIATION AND SENSITIVITIES ARE REQUIRED. Performed at [facility], [address] [place], [place] [address]    Report Status 08/19/2017 FINAL  Final  Blood Culture ID Panel (Reflexed)     Status: Abnormal   Collection Time: 08/15/17  3:12 PM  Result Value Ref Range Status   Enterococcus species NOT DETECTED NOT DETECTED Final   Listeria monocytogenes NOT DETECTED NOT DETECTED Final   Staphylococcus species DETECTED (A) NOT DETECTED Final    Comment: Methicillin (oxacillin) susceptible coagulase negative staphylococcus. Possible blood culture contaminant (unless isolated from more than one blood culture draw or clinical case suggests pathogenicity). No antibiotic treatment is indicated for blood  culture contaminants. CRITICAL RESULT CALLED TO, READ BACK BY AND VERIFIED WITH: JASON ROBBINS 08/16/17 AT 1906 BY HS    Staphylococcus aureus NOT DETECTED NOT DETECTED Final   Methicillin resistance NOT DETECTED NOT DETECTED Final   Streptococcus species NOT DETECTED NOT DETECTED Final   Streptococcus agalactiae NOT DETECTED NOT DETECTED Final   Streptococcus pneumoniae NOT DETECTED NOT DETECTED Final   Streptococcus pyogenes NOT DETECTED NOT DETECTED Final   Acinetobacter baumannii NOT DETECTED NOT DETECTED Final   Enterobacteriaceae species NOT DETECTED NOT DETECTED Final   Enterobacter cloacae complex NOT DETECTED NOT DETECTED Final   Escherichia coli NOT DETECTED NOT DETECTED Final   Klebsiella oxytoca NOT DETECTED NOT DETECTED Final   Klebsiella pneumoniae NOT DETECTED NOT DETECTED Final  Proteus species NOT DETECTED NOT DETECTED Final   Serratia marcescens NOT DETECTED NOT DETECTED Final   Haemophilus influenzae NOT DETECTED NOT  DETECTED Final   Neisseria meningitidis NOT DETECTED NOT DETECTED Final   Pseudomonas aeruginosa NOT DETECTED NOT DETECTED Final   Candida albicans NOT DETECTED NOT DETECTED Final   Candida glabrata NOT DETECTED NOT DETECTED Final   Candida krusei NOT DETECTED NOT DETECTED Final   Candida parapsilosis NOT DETECTED NOT DETECTED Final   Candida tropicalis NOT DETECTED NOT DETECTED Final    Comment: Performed at [facility], [address] [place], [place] [address]  MRSA PCR Screening     Status: Abnormal   Collection Time: 08/15/17 10:13 PM  Result Value Ref Range Status   MRSA by PCR POSITIVE (A) NEGATIVE Final    Comment:        The GeneXpert MRSA Assay (FDA approved for NASAL specimens only), is one component of a comprehensive MRSA colonization surveillance program. It is not intended to diagnose MRSA infection nor to guide or monitor treatment for MRSA infections. RESULT CALLED TO, READ BACK BY AND VERIFIED WITH: CHELSEY WRENN AT 0127 ON 08/16/17 RWW Performed at [facility], [address] [place], [place] [address]   Aerobic/Anaerobic Culture (surgical/deep wound)     Status: None (Preliminary result)   Collection Time: 08/20/17  4:22 PM  Result Value Ref Range Status   Specimen Description   Final    ABSCESS PERIRECTAL Performed at [facility], [address], [place], [place] [address]    Special Requests PENDING  Incomplete   Gram Stain NO WBC SEEN FEW GRAM POSITIVE COCCI   Final   Culture   Final    ABUNDANT METHICILLIN RESISTANT STAPHYLOCOCCUS AUREUS NO ANAEROBES ISOLATED; CULTURE IN PROGRESS FOR 5 DAYS    Report Status PENDING  Incomplete   Organism ID, Bacteria METHICILLIN RESISTANT STAPHYLOCOCCUS AUREUS  Final      Susceptibility   Methicillin resistant staphylococcus aureus - MIC*    CIPROFLOXACIN >=8 RESISTANT Resistant     ERYTHROMYCIN >=8 RESISTANT Resistant     GENTAMICIN <=0.5 SENSITIVE Sensitive     OXACILLIN >=4  RESISTANT Resistant     TETRACYCLINE >=16 RESISTANT Resistant     VANCOMYCIN <=0.5 SENSITIVE Sensitive     TRIMETH/SULFA <=10 SENSITIVE Sensitive     CLINDAMYCIN >=8 RESISTANT Resistant     RIFAMPIN <=0.5 SENSITIVE Sensitive     Inducible Clindamycin NEGATIVE Sensitive     * ABUNDANT METHICILLIN RESISTANT STAPHYLOCOCCUS AUREUS  CULTURE, BLOOD (ROUTINE X 2) w Reflex to ID Panel     Status: None (Preliminary result)   Collection Time: 08/23/17  6:27 PM  Result Value Ref Range Status   Specimen Description BLOOD LEFT AC  Final   Special Requests   Final    BOTTLES DRAWN AEROBIC AND ANAEROBIC Blood Culture adequate volume   Culture   Final    NO GROWTH 3 DAYS Performed at [facility], [address] [place], [place] [address]    Report Status PENDING  Incomplete  CULTURE, BLOOD (ROUTINE X 2) w Reflex to ID Panel     Status: None (Preliminary result)   Collection Time: 08/23/17  8:13 PM  Result Value Ref Range Status   Specimen Description BLOOD LEFT AC  Final   Special Requests   Final    BOTTLES DRAWN AEROBIC AND ANAEROBIC Blood Culture adequate volume   Culture   Final    NO GROWTH  3 DAYS Performed at [facility], [address] [place], [place] [address]    Report Status PENDING  Incomplete    RADIOLOGY:  No results found.   Management plans discussed with the patient, sister and they are in agreement.  CODE STATUS: Full Code   TOTAL TIME TAKING CARE OF THIS PATIENT: 35 minutes.    Shaune Pollack M.D on 08/26/2017 at 10:40 AM  Between 7am to 6pm - Pager - [phone]  After 6pm go to [email or web address] - Scientist, research (life sciences)  Hospitalists  Office  [phone]  CC: Primary care physician; Mickey Farber, MD   Note: This dictation was prepared with Dragon dictation along with smaller phrase technology. Any transcriptional errors that result from this process are unintentional.

## 2017-08-26 NOTE — Plan of Care (Signed)
  Problem: Education: Goal: Knowledge of General Education information will improve 08/26/2017 0424 by Myles GipKimrey, Forrestine Lecrone M, RN Outcome: Progressing 08/26/2017 0307 by Myles GipKimrey, Crickett Abbett M, RN Outcome: Progressing   Problem: Health Behavior/Discharge Planning: Goal: Ability to manage health-related needs will improve Outcome: Progressing   Problem: Clinical Measurements: Goal: Will remain free from infection Outcome: Progressing   Problem: Pain Managment: Goal: General experience of comfort will improve Outcome: Progressing   Problem: Safety: Goal: Ability to remain free from injury will improve Outcome: Progressing

## 2017-08-26 NOTE — Care Management Important Message (Signed)
Important Message  Patient Details  Name: Brenda Glenn MRN: [id number] Date of Birth: 1940/06/12   Medicare Important Message Given:  Yes    Eber HongGreene, Moriah Shawley R, RN 08/26/2017, 2:34 PM

## 2017-08-26 NOTE — Progress Notes (Signed)
EMS is here to pick up patient. Removed telemetry monitor and peripheral IV.

## 2017-08-27 ENCOUNTER — Encounter: Payer: Medicare Other | Admitting: Surgery

## 2017-08-27 DIAGNOSIS — N3000 Acute cystitis without hematuria: Secondary | ICD-10-CM | POA: Insufficient documentation

## 2017-08-28 LAB — CULTURE, BLOOD (ROUTINE X 2)
CULTURE: NO GROWTH
Culture: NO GROWTH
Special Requests: ADEQUATE
Special Requests: ADEQUATE

## 2017-08-30 ENCOUNTER — Encounter: Payer: Medicare Other | Admitting: Surgery

## 2017-08-30 ENCOUNTER — Other Ambulatory Visit: Payer: Self-pay

## 2017-08-30 DIAGNOSIS — M1990 Unspecified osteoarthritis, unspecified site: Secondary | ICD-10-CM | POA: Insufficient documentation

## 2017-09-03 ENCOUNTER — Emergency Department: Payer: Medicare Other

## 2017-09-03 ENCOUNTER — Inpatient Hospital Stay
Admission: EM | Admit: 2017-09-03 | Discharge: 2017-09-07 | DRG: 291 | Disposition: A | Discharge status: Skilled Nursing Facility | Payer: Medicare Other | Attending: Internal Medicine | Admitting: Internal Medicine

## 2017-09-03 ENCOUNTER — Other Ambulatory Visit: Payer: Self-pay

## 2017-09-03 DIAGNOSIS — N170 Acute kidney failure with tubular necrosis: Secondary | ICD-10-CM | POA: Diagnosis present

## 2017-09-03 DIAGNOSIS — E875 Hyperkalemia: Secondary | ICD-10-CM

## 2017-09-03 DIAGNOSIS — K6131 Horseshoe abscess: Secondary | ICD-10-CM | POA: Diagnosis present

## 2017-09-03 DIAGNOSIS — N179 Acute kidney failure, unspecified: Secondary | ICD-10-CM | POA: Diagnosis not present

## 2017-09-03 DIAGNOSIS — I5033 Acute on chronic diastolic (congestive) heart failure: Secondary | ICD-10-CM | POA: Diagnosis present

## 2017-09-03 DIAGNOSIS — Z96651 Presence of right artificial knee joint: Secondary | ICD-10-CM | POA: Diagnosis present

## 2017-09-03 DIAGNOSIS — E872 Acidosis: Secondary | ICD-10-CM | POA: Diagnosis present

## 2017-09-03 DIAGNOSIS — J441 Chronic obstructive pulmonary disease with (acute) exacerbation: Secondary | ICD-10-CM | POA: Diagnosis present

## 2017-09-03 DIAGNOSIS — X58XXXA Exposure to other specified factors, initial encounter: Secondary | ICD-10-CM | POA: Diagnosis present

## 2017-09-03 DIAGNOSIS — E876 Hypokalemia: Secondary | ICD-10-CM | POA: Diagnosis present

## 2017-09-03 DIAGNOSIS — T40605A Adverse effect of unspecified narcotics, initial encounter: Secondary | ICD-10-CM | POA: Diagnosis present

## 2017-09-03 DIAGNOSIS — K612 Anorectal abscess: Secondary | ICD-10-CM | POA: Diagnosis not present

## 2017-09-03 DIAGNOSIS — M1990 Unspecified osteoarthritis, unspecified site: Secondary | ICD-10-CM | POA: Diagnosis present

## 2017-09-03 DIAGNOSIS — J9602 Acute respiratory failure with hypercapnia: Secondary | ICD-10-CM | POA: Diagnosis present

## 2017-09-03 DIAGNOSIS — J189 Pneumonia, unspecified organism: Secondary | ICD-10-CM

## 2017-09-03 DIAGNOSIS — J810 Acute pulmonary edema: Secondary | ICD-10-CM | POA: Diagnosis present

## 2017-09-03 DIAGNOSIS — D649 Anemia, unspecified: Secondary | ICD-10-CM | POA: Diagnosis present

## 2017-09-03 DIAGNOSIS — R330 Drug induced retention of urine: Secondary | ICD-10-CM | POA: Diagnosis present

## 2017-09-03 DIAGNOSIS — T68XXXA Hypothermia, initial encounter: Secondary | ICD-10-CM | POA: Diagnosis present

## 2017-09-03 DIAGNOSIS — Z66 Do not resuscitate: Secondary | ICD-10-CM | POA: Diagnosis present

## 2017-09-03 DIAGNOSIS — M549 Dorsalgia, unspecified: Secondary | ICD-10-CM | POA: Diagnosis present

## 2017-09-03 DIAGNOSIS — J440 Chronic obstructive pulmonary disease with acute lower respiratory infection: Secondary | ICD-10-CM | POA: Diagnosis present

## 2017-09-03 DIAGNOSIS — J9601 Acute respiratory failure with hypoxia: Secondary | ICD-10-CM

## 2017-09-03 DIAGNOSIS — Z7982 Long term (current) use of aspirin: Secondary | ICD-10-CM

## 2017-09-03 DIAGNOSIS — G9341 Metabolic encephalopathy: Secondary | ICD-10-CM | POA: Diagnosis present

## 2017-09-03 DIAGNOSIS — Z9981 Dependence on supplemental oxygen: Secondary | ICD-10-CM

## 2017-09-03 DIAGNOSIS — Z79899 Other long term (current) drug therapy: Secondary | ICD-10-CM

## 2017-09-03 DIAGNOSIS — Z881 Allergy status to other antibiotic agents status: Secondary | ICD-10-CM

## 2017-09-03 DIAGNOSIS — Z96611 Presence of right artificial shoulder joint: Secondary | ICD-10-CM | POA: Diagnosis present

## 2017-09-03 DIAGNOSIS — R402413 Glasgow coma scale score 13-15, at hospital admission: Secondary | ICD-10-CM | POA: Diagnosis present

## 2017-09-03 DIAGNOSIS — J9621 Acute and chronic respiratory failure with hypoxia: Secondary | ICD-10-CM

## 2017-09-03 DIAGNOSIS — E119 Type 2 diabetes mellitus without complications: Secondary | ICD-10-CM | POA: Diagnosis present

## 2017-09-03 DIAGNOSIS — K219 Gastro-esophageal reflux disease without esophagitis: Secondary | ICD-10-CM | POA: Diagnosis present

## 2017-09-03 DIAGNOSIS — Z7951 Long term (current) use of inhaled steroids: Secondary | ICD-10-CM

## 2017-09-03 DIAGNOSIS — I110 Hypertensive heart disease with heart failure: Principal | ICD-10-CM | POA: Diagnosis present

## 2017-09-03 DIAGNOSIS — F17210 Nicotine dependence, cigarettes, uncomplicated: Secondary | ICD-10-CM | POA: Diagnosis present

## 2017-09-03 DIAGNOSIS — A419 Sepsis, unspecified organism: Secondary | ICD-10-CM | POA: Diagnosis not present

## 2017-09-03 DIAGNOSIS — Z791 Long term (current) use of non-steroidal anti-inflammatories (NSAID): Secondary | ICD-10-CM

## 2017-09-03 DIAGNOSIS — Z888 Allergy status to other drugs, medicaments and biological substances status: Secondary | ICD-10-CM

## 2017-09-03 LAB — PROTIME-INR
INR: 1.03
Prothrombin Time: 13.4 seconds (ref 11.4–15.2)

## 2017-09-03 LAB — CBC WITH DIFFERENTIAL/PLATELET
Basophils Absolute: 0 10*3/uL (ref 0–0.1)
Basophils Relative: 1 %
EOS PCT: 0 %
Eosinophils Absolute: 0 10*3/uL (ref 0–0.7)
HEMATOCRIT: 26 % — AB (ref 35.0–47.0)
HEMOGLOBIN: 8.1 g/dL — AB (ref 12.0–16.0)
Lymphocytes Relative: 11 %
Lymphs Abs: 0.3 10*3/uL — ABNORMAL LOW (ref 1.0–3.6)
MCH: 30.9 pg (ref 26.0–34.0)
MCHC: 31.1 g/dL — ABNORMAL LOW (ref 32.0–36.0)
MCV: 99.3 fL (ref 80.0–100.0)
MONOS PCT: 9 %
Monocytes Absolute: 0.3 10*3/uL (ref 0.2–0.9)
NEUTROS ABS: 2.5 10*3/uL (ref 1.4–6.5)
NRBC: 4 /100{WBCs} — AB
Neutrophils Relative %: 79 %
Platelets: 378 10*3/uL (ref 150–440)
RBC: 2.62 MIL/uL — AB (ref 3.80–5.20)
RDW: 19.2 % — AB (ref 11.5–14.5)
WBC: 3.1 10*3/uL — AB (ref 3.6–11.0)

## 2017-09-03 LAB — GLUCOSE, CAPILLARY
GLUCOSE-CAPILLARY: 73 mg/dL (ref 70–99)
Glucose-Capillary: 71 mg/dL (ref 70–99)

## 2017-09-03 LAB — BASIC METABOLIC PANEL
ANION GAP: 9 (ref 5–15)
BUN: 38 mg/dL — ABNORMAL HIGH (ref 8–23)
CALCIUM: 7.9 mg/dL — AB (ref 8.9–10.3)
CHLORIDE: 108 mmol/L (ref 98–111)
CO2: 24 mmol/L (ref 22–32)
Creatinine, Ser: 2.35 mg/dL — ABNORMAL HIGH (ref 0.44–1.00)
GFR calc Af Amer: 22 mL/min — ABNORMAL LOW (ref >60)
GFR calc non Af Amer: 19 mL/min — ABNORMAL LOW (ref >60)
GLUCOSE: 79 mg/dL (ref 70–99)
Potassium: 6.8 mmol/L (ref 3.5–5.1)
Sodium: 141 mmol/L (ref 135–145)

## 2017-09-03 LAB — TROPONIN I: Troponin I: <0.03 ng/mL (ref <0.03)

## 2017-09-03 LAB — BLOOD GAS, VENOUS
ACID-BASE DEFICIT: 5 mmol/L — AB (ref 0.0–2.0)
BICARBONATE: 23.9 mmol/L (ref 20.0–28.0)
FIO2: 21
O2 Saturation: 78.1 %
PATIENT TEMPERATURE: 37
PH VEN: 7.16 — AB (ref 7.250–7.430)
PO2 VEN: 55 mmHg — AB (ref 32.0–45.0)
pCO2, Ven: 67 mmHg — ABNORMAL HIGH (ref 44.0–60.0)

## 2017-09-03 LAB — URINALYSIS, COMPLETE (UACMP) WITH MICROSCOPIC
BACTERIA UA: NONE SEEN
BILIRUBIN URINE: NEGATIVE
GLUCOSE, UA: NEGATIVE mg/dL
HGB URINE DIPSTICK: NEGATIVE
KETONES UR: NEGATIVE mg/dL
LEUKOCYTES UA: NEGATIVE
Nitrite: NEGATIVE
Protein, ur: 100 mg/dL — AB
Specific Gravity, Urine: 1.018 (ref 1.005–1.030)
pH: 5 (ref 5.0–8.0)

## 2017-09-03 LAB — LACTIC ACID, PLASMA: Lactic Acid, Venous: 1 mmol/L (ref 0.5–1.9)

## 2017-09-03 LAB — PHOSPHORUS: PHOSPHORUS: 4.6 mg/dL (ref 2.5–4.6)

## 2017-09-03 LAB — COMPREHENSIVE METABOLIC PANEL
ALT: 13 U/L (ref 0–44)
ANION GAP: 7 (ref 5–15)
AST: 24 U/L (ref 15–41)
Albumin: 2.7 g/dL — ABNORMAL LOW (ref 3.5–5.0)
Alkaline Phosphatase: 84 U/L (ref 38–126)
BUN: 37 mg/dL — ABNORMAL HIGH (ref 8–23)
CALCIUM: 7.5 mg/dL — AB (ref 8.9–10.3)
CO2: 23 mmol/L (ref 22–32)
Chloride: 108 mmol/L (ref 98–111)
Creatinine, Ser: 2.32 mg/dL — ABNORMAL HIGH (ref 0.44–1.00)
GFR, EST AFRICAN AMERICAN: 22 mL/min — AB (ref >60)
GFR, EST NON AFRICAN AMERICAN: 19 mL/min — AB (ref >60)
Glucose, Bld: 89 mg/dL (ref 70–99)
POTASSIUM: 7.2 mmol/L — AB (ref 3.5–5.1)
SODIUM: 138 mmol/L (ref 135–145)
Total Bilirubin: 0.7 mg/dL (ref 0.3–1.2)
Total Protein: 6.2 g/dL — ABNORMAL LOW (ref 6.5–8.1)

## 2017-09-03 LAB — MAGNESIUM: Magnesium: 2.4 mg/dL (ref 1.7–2.4)

## 2017-09-03 LAB — BRAIN NATRIURETIC PEPTIDE: B Natriuretic Peptide: 634 pg/mL — ABNORMAL HIGH (ref 0.0–100.0)

## 2017-09-03 MED ORDER — DEXTROSE 50 % IV SOLN
25.0000 g | Freq: Once | INTRAVENOUS | Status: AC
  Administered 2017-09-03: 25 g via INTRAVENOUS
  Filled 2017-09-03: qty 50

## 2017-09-03 MED ORDER — PIPERACILLIN-TAZOBACTAM 3.375 G IVPB
3.3750 g | Freq: Two times a day (BID) | INTRAVENOUS | Status: DC
  Administered 2017-09-03 – 2017-09-04 (×2): 3.375 g via INTRAVENOUS
  Filled 2017-09-03 (×2): qty 50

## 2017-09-03 MED ORDER — CALCIUM CARBONATE ANTACID 500 MG PO CHEW
1000.0000 mg | CHEWABLE_TABLET | Freq: Three times a day (TID) | ORAL | Status: DC
  Administered 2017-09-07: 1000 mg via ORAL
  Filled 2017-09-03: qty 5

## 2017-09-03 MED ORDER — THEOPHYLLINE ER 400 MG PO TB24
400.0000 mg | ORAL_TABLET | Freq: Every day | ORAL | Status: DC
  Administered 2017-09-07: 400 mg via ORAL
  Filled 2017-09-03 (×4): qty 1

## 2017-09-03 MED ORDER — PATIROMER SORBITEX CALCIUM 8.4 G PO PACK
8.4000 g | PACK | Freq: Every day | ORAL | Status: DC
  Filled 2017-09-03 (×5): qty 1

## 2017-09-03 MED ORDER — NOREPINEPHRINE 4 MG/250ML-% IV SOLN
0.0000 ug/min | INTRAVENOUS | Status: DC
  Administered 2017-09-03 – 2017-09-04 (×2): 5 ug/min via INTRAVENOUS
  Administered 2017-09-05: 2 ug/min via INTRAVENOUS
  Filled 2017-09-03 (×2): qty 250

## 2017-09-03 MED ORDER — LORAZEPAM 2 MG/ML IJ SOLN
1.0000 mg | Freq: Once | INTRAMUSCULAR | Status: AC
  Administered 2017-09-03: 1 mg via INTRAVENOUS
  Filled 2017-09-03: qty 1

## 2017-09-03 MED ORDER — MOMETASONE FURO-FORMOTEROL FUM 200-5 MCG/ACT IN AERO
2.0000 | INHALATION_SPRAY | Freq: Two times a day (BID) | RESPIRATORY_TRACT | Status: DC
  Administered 2017-09-04 – 2017-09-07 (×5): 2 via RESPIRATORY_TRACT
  Filled 2017-09-03: qty 8.8

## 2017-09-03 MED ORDER — FOLIC ACID 1 MG PO TABS
1.0000 mg | ORAL_TABLET | Freq: Every day | ORAL | Status: DC
  Administered 2017-09-07: 1 mg via ORAL
  Filled 2017-09-03: qty 1

## 2017-09-03 MED ORDER — ASPIRIN EC 81 MG PO TBEC
81.0000 mg | DELAYED_RELEASE_TABLET | Freq: Every day | ORAL | Status: DC
  Administered 2017-09-07: 81 mg via ORAL
  Filled 2017-09-03: qty 1

## 2017-09-03 MED ORDER — CHLORHEXIDINE GLUCONATE CLOTH 2 % EX PADS
6.0000 | MEDICATED_PAD | Freq: Every day | CUTANEOUS | Status: DC
  Administered 2017-09-04 – 2017-09-07 (×4): 6 via TOPICAL

## 2017-09-03 MED ORDER — VANCOMYCIN HCL IN DEXTROSE 1-5 GM/200ML-% IV SOLN
1000.0000 mg | Freq: Once | INTRAVENOUS | Status: AC
  Administered 2017-09-03: 1000 mg via INTRAVENOUS
  Filled 2017-09-03: qty 200

## 2017-09-03 MED ORDER — FLUTICASONE PROPIONATE 50 MCG/ACT NA SUSP
2.0000 | Freq: Every day | NASAL | Status: DC | PRN
  Filled 2017-09-03: qty 16

## 2017-09-03 MED ORDER — SODIUM CHLORIDE 0.9 % IV SOLN
1.0000 g | Freq: Once | INTRAVENOUS | Status: DC
  Filled 2017-09-03: qty 10

## 2017-09-03 MED ORDER — TIOTROPIUM BROMIDE MONOHYDRATE 18 MCG IN CAPS
18.0000 ug | ORAL_CAPSULE | Freq: Every day | RESPIRATORY_TRACT | Status: DC
  Administered 2017-09-05 – 2017-09-07 (×2): 18 ug via RESPIRATORY_TRACT
  Filled 2017-09-03: qty 5

## 2017-09-03 MED ORDER — CALCIUM GLUCONATE 10 % IV SOLN
INTRAVENOUS | Status: AC
Start: 2017-09-03 — End: 2017-09-04
  Filled 2017-09-03: qty 10

## 2017-09-03 MED ORDER — FENTANYL CITRATE (PF) 100 MCG/2ML IJ SOLN
12.5000 ug | Freq: Once | INTRAMUSCULAR | Status: AC
  Administered 2017-09-03: 12.5 ug via INTRAVENOUS
  Filled 2017-09-03: qty 2

## 2017-09-03 MED ORDER — GABAPENTIN 400 MG PO CAPS: 400.0000 mg | ORAL_CAPSULE | Freq: Three times a day (TID) | ORAL | Status: DC

## 2017-09-03 MED ORDER — SODIUM CHLORIDE 0.9 % IV SOLN
INTRAVENOUS | Status: DC
  Administered 2017-09-03: 20:00:00 via INTRAVENOUS

## 2017-09-03 MED ORDER — LORAZEPAM 2 MG/ML IJ SOLN
INTRAMUSCULAR | Status: AC
  Administered 2017-09-03: 1 mg via INTRAVENOUS
  Filled 2017-09-03: qty 1

## 2017-09-03 MED ORDER — AZTREONAM 2 G IJ SOLR
2.0000 g | Freq: Once | INTRAMUSCULAR | Status: AC
  Administered 2017-09-03: 2 g via INTRAVENOUS
  Filled 2017-09-03: qty 2

## 2017-09-03 MED ORDER — ORAL CARE MOUTH RINSE
15.0000 mL | Freq: Two times a day (BID) | OROMUCOSAL | Status: DC
  Administered 2017-09-04 – 2017-09-05 (×3): 15 mL via OROMUCOSAL

## 2017-09-03 MED ORDER — BISACODYL 5 MG PO TBEC: 10.0000 mg | DELAYED_RELEASE_TABLET | Freq: Every day | ORAL | Status: DC

## 2017-09-03 MED ORDER — LORAZEPAM 0.5 MG PO TABS: 0.5000 mg | ORAL_TABLET | ORAL | Status: DC | PRN

## 2017-09-03 MED ORDER — DOCUSATE SODIUM 100 MG PO CAPS: 100.0000 mg | ORAL_CAPSULE | Freq: Two times a day (BID) | ORAL | Status: DC | PRN

## 2017-09-03 MED ORDER — CHLORHEXIDINE GLUCONATE 0.12 % MT SOLN
15.0000 mL | Freq: Two times a day (BID) | OROMUCOSAL | Status: DC
  Administered 2017-09-03 – 2017-09-07 (×7): 15 mL via OROMUCOSAL
  Filled 2017-09-03 (×6): qty 15

## 2017-09-03 MED ORDER — SODIUM CHLORIDE 0.9 % IV BOLUS
1000.0000 mL | Freq: Once | INTRAVENOUS | Status: AC
  Administered 2017-09-03: 1000 mL via INTRAVENOUS

## 2017-09-03 MED ORDER — VANCOMYCIN HCL IN DEXTROSE 750-5 MG/150ML-% IV SOLN
750.0000 mg | INTRAVENOUS | Status: DC
  Administered 2017-09-03: 750 mg via INTRAVENOUS
  Filled 2017-09-03: qty 150

## 2017-09-03 MED ORDER — SODIUM BICARBONATE 8.4 % IV SOLN
50.0000 meq | Freq: Once | INTRAVENOUS | Status: AC
  Administered 2017-09-03: 50 meq via INTRAVENOUS
  Filled 2017-09-03: qty 50

## 2017-09-03 MED ORDER — IPRATROPIUM-ALBUTEROL 0.5-2.5 (3) MG/3ML IN SOLN
3.0000 mL | Freq: Once | RESPIRATORY_TRACT | Status: AC
  Administered 2017-09-03: 3 mL via RESPIRATORY_TRACT
  Filled 2017-09-03: qty 3

## 2017-09-03 MED ORDER — NICOTINE 21 MG/24HR TD PT24
21.0000 mg | MEDICATED_PATCH | Freq: Every day | TRANSDERMAL | Status: DC
  Administered 2017-09-04: 21 mg via TRANSDERMAL
  Filled 2017-09-03 (×3): qty 1

## 2017-09-03 MED ORDER — SODIUM CHLORIDE 0.9 % IV BOLUS
500.0000 mL | INTRAVENOUS | Status: AC
  Administered 2017-09-03 (×2): 500 mL via INTRAVENOUS

## 2017-09-03 MED ORDER — LORAZEPAM 2 MG/ML IJ SOLN
1.0000 mg | Freq: Once | INTRAMUSCULAR | Status: AC
  Administered 2017-09-03: 1 mg via INTRAVENOUS

## 2017-09-03 MED ORDER — CALCIUM GLUCONATE 10 % IV SOLN
1.0000 g | Freq: Once | INTRAVENOUS | Status: AC
  Administered 2017-09-03: 1 g via INTRAVENOUS

## 2017-09-03 MED ORDER — HEPARIN SODIUM (PORCINE) 5000 UNIT/ML IJ SOLN
5000.0000 [IU] | Freq: Three times a day (TID) | INTRAMUSCULAR | Status: DC
  Administered 2017-09-03 – 2017-09-07 (×10): 5000 [IU] via SUBCUTANEOUS
  Filled 2017-09-03 (×10): qty 1

## 2017-09-03 MED ORDER — OXYCODONE HCL 5 MG PO TABS
15.0000 mg | ORAL_TABLET | Freq: Four times a day (QID) | ORAL | Status: DC
Start: 2017-09-03 — End: 2017-09-07
  Administered 2017-09-06 – 2017-09-07 (×4): 15 mg via ORAL
  Filled 2017-09-03 (×4): qty 3

## 2017-09-03 MED ORDER — INSULIN ASPART 100 UNIT/ML ~~LOC~~ SOLN
10.0000 [IU] | Freq: Once | SUBCUTANEOUS | Status: AC
  Administered 2017-09-03: 10 [IU] via INTRAVENOUS
  Filled 2017-09-03: qty 1

## 2017-09-03 MED ORDER — PANTOPRAZOLE SODIUM 40 MG PO TBEC
40.0000 mg | DELAYED_RELEASE_TABLET | Freq: Every day | ORAL | Status: DC
  Administered 2017-09-07: 40 mg via ORAL
  Filled 2017-09-03: qty 1

## 2017-09-03 MED ORDER — GUAIFENESIN ER 600 MG PO TB12: 600.0000 mg | ORAL_TABLET | Freq: Two times a day (BID) | ORAL | Status: DC | PRN

## 2017-09-03 MED ORDER — DOCUSATE SODIUM 100 MG PO CAPS
100.0000 mg | ORAL_CAPSULE | Freq: Two times a day (BID) | ORAL | Status: DC
  Administered 2017-09-06 – 2017-09-07 (×2): 100 mg via ORAL
  Filled 2017-09-03 (×3): qty 1

## 2017-09-03 NOTE — Progress Notes (Signed)
Obtained consent via telephone from patient husband for Hemodialysis. ([facility])  Second RN verified via telephone. Billey Gosling(Charlie, RN)  Consent placed in chart.

## 2017-09-03 NOTE — Progress Notes (Signed)
This note also relates to the following rows which could not be included: Temp - Cannot attach notes to unvalidated device data Pulse Rate - Cannot attach notes to unvalidated device data Resp - Cannot attach notes to unvalidated device data BP - Cannot attach notes to unvalidated device data  Hd started

## 2017-09-03 NOTE — ED Notes (Signed)
450 mL urine emptied from catheter bag

## 2017-09-03 NOTE — Progress Notes (Signed)
Notified Dr Thedore MinsSingh patient having labs redone and I have been in contact with ICU nurse, notified that if the dialysis treatment is still needed that she can contact me via telephone.  Dr Thedore MinsSingh voiced understanding.

## 2017-09-03 NOTE — Progress Notes (Signed)
Pt's daughters at the beside.  Updated on pt's condition and plan of care.  Contact information received and updated in computer.  Password "Abby" established.

## 2017-09-03 NOTE — ED Triage Notes (Signed)
Pt arrives via ems from [facility]. Ems states facility reported pt oxygen dropped to 40%. Ems states pt O2 saturation at 89 on 3L. Pt uses 3 L chronically. On arrival to ed pt sat 84% on 3L. Pt placed on 6L sat currently 90%. Pt baseline normal is alert and oriented with out hx of dementia per pt daughter. Pt currently confused. Breath sounds clear.

## 2017-09-03 NOTE — ED Provider Notes (Signed)
[facility] Emergency Department Provider Note ____________________________________________   First MD Initiated Contact with Patient 09/03/17 1312     (approximate)  I have reviewed the triage vital signs and the nursing notes.   HISTORY  Chief Complaint Shortness of Breath  Level 5 caveat: History of present illness limited due to altered mental status  HPI Brenda Glenn is a 77 y.o. female with PMH as noted below who presents with worsening confusion, difficulty breathing, and generalized weakness over the last several days.  The patient was admitted at the end of last month for sepsis related to UTI.  Her family member state that she has been declining over the last several days, and she was found to be hypoxic to as low as the 40s at home today.  Past Medical History:  Diagnosis Date  . Arthritis    all over  . Back pain   . COPD (chronic obstructive pulmonary disease) (HCC)    O2 use continuosly at 2 liters/ some  asthma symptoms  . Diabetes mellitus without complication (HCC)    diet controlled  . GERD (gastroesophageal reflux disease)   . Heart murmur    lifelong  . Hypercholesterolemia   . Hypertension    controlled on meds  . Pneumonia 2015   [facility]  . Renal insufficiency    early stage kidney failure  . Shortness of breath dyspnea   . Sleep apnea    2nd sleep study said pt does not have sleep apnea  . Swelling    of feet and legs    Patient Active Problem List   Diagnosis Date Noted  . Osteoarthritis 08/30/2017  . Restless leg syndrome 08/21/2017  . Perianal abscess   . Sepsis (HCC) 08/15/2017  . Bilateral lower extremity edema 07/28/2017  . Lower extremity numbness 07/28/2017  . ARF (acute renal failure) (HCC) 06/20/2017  . 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 Multiple thyroid nodules 10/22/2016  . Lymphedema 01/06/2016  . Chronic venous insufficiency 01/06/2016  . Pain in limb 01/06/2016  . COPD  (chronic obstructive pulmonary disease) (HCC) 01/06/2016  . Abnormality of gait 01/06/2016  . Hypomagnesemia 05/24/2015  . Type 2 diabetes mellitus (HCC) 11/16/2013  . Benign essential hypertension 11/16/2013  . GERD (gastroesophageal reflux disease) 11/16/2013  . Hyperlipidemia 11/16/2013    Past Surgical History:  Procedure Laterality Date  . ABDOMINAL HYSTERECTOMY  1988  . BACK SURGERY  1978 and 1995   residual nerve damage legs-weakness  . BLADDER SURGERY  2017  . CATARACT EXTRACTION Right   . CATARACT EXTRACTION W/PHACO Left 09/05/2014   Procedure: CATARACT EXTRACTION PHACO AND INTRAOCULAR LENS PLACEMENT (IOC);  Surgeon: Lockie Molahadwick Brasington, MD;  Location[facility];  Service: Ophthalmology;  Laterality: Left;  DIABETIC  . CHOLECYSTECTOMY  2011  . INCISION AND DRAINAGE PERIRECTAL ABSCESS N/A 08/20/2017   Procedure: IRRIGATION AND DEBRIDEMENT PERIRECTAL ABSCESS;  Surgeon: Leafy RoPabon, Diego F, MD;  Location: [facility] ORS;  Service: General;  Laterality: N/A;  . JOINT REPLACEMENT Right 2009  . RECTAL EXAM UNDER ANESTHESIA N/A 08/20/2017   Procedure: RECTAL EXAM UNDER ANESTHESIA;  Surgeon: Leafy RoPabon, Diego F, MD;  Location: [facility] ORS;  Service: General;  Laterality: N/A;  . REPLACEMENT TOTAL KNEE Right   . REVERSE SHOULDER ARTHROPLASTY Right 01/08/2017   Procedure: REVERSE SHOULDER ARTHROPLASTY;  Surgeon: Signa KellPatel, Sunny, MD;  Location: [facility] ORS;  Service: Orthopedics;  Laterality: Right;    Prior to Admission medications   Medication Sig Start Date End  Date Taking? Authorizing Provider  albuterol (PROVENTIL HFA;VENTOLIN HFA) 108 (90 Base) MCG/ACT inhaler Inhale 2 puffs into the lungs every 6 (six) hours as needed for wheezing or shortness of breath.   Yes [provider]  allopurinol (ZYLOPRIM) 100 MG tablet Take 200 mg by mouth daily.    Yes [provider]  aspirin EC 81 MG tablet Take 81 mg by mouth daily.    Yes [provider]  bisacodyl (DULCOLAX) 5 MG EC  tablet Take 2 tablets (10 mg total) by mouth daily. 08/27/17  Yes Shaune Pollack, MD  calcium carbonate (TUMS - DOSED IN MG ELEMENTAL CALCIUM) 500 MG chewable tablet Chew 2 tablets (1,000 mg total) by mouth 3 (three) times daily with meals. 08/26/17  Yes Shaune Pollack, MD  diclofenac (FLECTOR) 1.3 % PTCH Place 1 patch onto the skin daily as needed (for pain).   Yes [provider]  docusate sodium (COLACE) 100 MG capsule Take 1 capsule (100 mg total) by mouth 2 (two) times daily. 08/26/17  Yes Shaune Pollack, MD  ezetimibe (ZETIA) 10 MG tablet Take 10 mg by mouth at bedtime.   Yes [provider]  felodipine (PLENDIL) 10 MG 24 hr tablet Take 10 mg by mouth at bedtime.   Yes [provider]  fluticasone (FLONASE) 50 MCG/ACT nasal spray Place 2 sprays into both nostrils daily as needed for allergies. 12/08/16  Yes [provider]  fluticasone-salmeterol (ADVAIR HFA) 115-21 MCG/ACT inhaler Inhale 2 puffs into the lungs 2 (two) times daily.    Yes [provider]  folic acid (FOLVITE) 1 MG tablet Take 1 tablet (1 mg total) by mouth daily. 08/27/17  Yes Shaune Pollack, MD  gabapentin (NEURONTIN) 400 MG capsule Take 1 capsule (400 mg total) by mouth 3 (three) times daily. 08/26/17  Yes Shaune Pollack, MD  guaiFENesin (MUCINEX) 600 MG 12 hr tablet Take 600 mg by mouth every 12 (twelve) hours as needed for cough.   Yes [provider]  LORazepam (ATIVAN) 0.5 MG tablet Take 0.5 mg by mouth every 4 (four) hours as needed for anxiety.   Yes [provider]  metoprolol succinate (TOPROL-XL) 100 MG 24 hr tablet Take 100 mg by mouth daily.    Yes [provider]  nicotine (NICODERM CQ - DOSED IN MG/24 HOURS) 14 mg/24hr patch APP 1 PA EXT TO THE SKIN D 07/01/17  Yes [provider]  omeprazole (PRILOSEC) 40 MG capsule Take 40 mg by mouth daily.    Yes [provider]  oxyCODONE (ROXICODONE) 15 MG immediate release tablet Take 1 tablet (15 mg total) by  mouth every 6 (six) hours. 08/26/17  Yes Shaune Pollack, MD  pramipexole (MIRAPEX) 0.5 MG tablet Take 1 mg by mouth at bedtime.    Yes [provider]  sulfamethoxazole-trimethoprim (BACTRIM DS,SEPTRA DS) 800-160 MG tablet Take 1 tablet by mouth every 12 (twelve) hours. 08/26/17  Yes Shaune Pollack, MD  theophylline (UNIPHYL) 400 MG 24 hr tablet Take 1 tablet by mouth daily.  08/17/17  Yes [provider]  tiotropium (SPIRIVA) 18 MCG inhalation capsule Place 18 mcg into inhaler and inhale daily.    Yes [provider]  Vitamin D, Ergocalciferol, (DRISDOL) 50000 UNITS CAPS capsule Take 50,000 Units by mouth every 14 (fourteen) days.   Yes [provider]    Allergies Tequin [gatifloxacin]; Bupropion; Keflex [cephalexin]; Lisinopril; and Varenicline  Family History  Problem Relation Age of Onset  . Breast cancer Mother 42  .  Breast cancer Paternal Grandmother 38    Social History Social History   Tobacco Use  . Smoking status: Current Every Day Smoker    Packs/day: 0.25    Years: 50.00    Pack years: 12.50    Types: Cigarettes  . Smokeless tobacco: Never Used  Substance Use Topics  . Alcohol use: Yes    Alcohol/week: 1.8 oz    Types: 3 Glasses of wine per week    Comment: rarely  . Drug use: No    Review of Systems Level 5 caveat: Unable to obtain review of systems due to altered mental status    ____________________________________________   PHYSICAL EXAM:  VITAL SIGNS: ED Triage Vitals  Enc Vitals Group     BP 09/03/17 1304 101/83     Pulse Rate 09/03/17 1253 70     Resp 09/03/17 1303 18     Temp 09/03/17 1303 97.6 F (36.4 C)     Temp Source 09/03/17 1303 Oral     SpO2 09/03/17 1253 (!) 85 %     Weight 09/03/17 1306 187 lb (84.8 kg)     Height 09/03/17 1306 5' (1.524 m)     Head Circumference --      Peak Flow --      Pain Score --      Pain Loc --      Pain Edu? --      Excl. in GC? --     Constitutional: Alert, confused.   Uncomfortable appearing. Eyes: Conjunctivae are normal.  EOMI.  PERRLA. Head: Atraumatic. Nose: No congestion/rhinnorhea. Mouth/Throat: Mucous membranes are moist.   Neck: Normal range of motion.  Cardiovascular: Normal rate, regular rhythm. Grossly normal heart sounds.  Good peripheral circulation. Respiratory: Somewhat decreased respiratory effort.  Scattered wheezes and rales to lower lungs bilaterally. Gastrointestinal: Soft and nontender. No distention.  Perianal and buttock area with drain in place, with no erythema, warmth, induration, or drainage. Genitourinary: No flank tenderness. Musculoskeletal: 2+ bilateral lower extremity edema.  Extremities warm and well perfused.  Neurologic: Motor intact in all extremities. Skin:  Skin is warm and dry. No rash noted. Psychiatric: Unable to assess.  ____________________________________________   LABS (all labs ordered are listed, but only abnormal results are displayed)  Labs Reviewed  BLOOD GAS, VENOUS - Abnormal; Notable for the following components:      Result Value   pH, Ven 7.16 (*)    pCO2, Ven 67 (*)    pO2, Ven 55.0 (*)    Acid-base deficit 5.0 (*)    All other components within normal limits  URINALYSIS, COMPLETE (UACMP) WITH MICROSCOPIC - Abnormal; Notable for the following components:   Color, Urine AMBER (*)    APPearance CLOUDY (*)    Protein, ur 100 (*)    All other components within normal limits  COMPREHENSIVE METABOLIC PANEL - Abnormal; Notable for the following components:   Potassium 7.2 (*)    BUN 37 (*)    Creatinine, Ser 2.32 (*)    Calcium 7.5 (*)    Total Protein 6.2 (*)    Albumin 2.7 (*)    GFR calc non Af Amer 19 (*)    GFR calc Af Amer 22 (*)    All other components within normal limits  CBC WITH DIFFERENTIAL/PLATELET - Abnormal; Notable for the following components:   WBC 3.1 (*)    RBC 2.62 (*)    Hemoglobin 8.1 (*)    HCT 26.0 (*)    MCHC 31.1 (*)  RDW 19.2 (*)    Lymphs Abs 0.3 (*)      All other components within normal limits  CULTURE, BLOOD (ROUTINE X 2)  CULTURE, BLOOD (ROUTINE X 2)  TROPONIN I  LACTIC ACID, PLASMA  LACTIC ACID, PLASMA  CBC WITH DIFFERENTIAL/PLATELET  BRAIN NATRIURETIC PEPTIDE   ____________________________________________  EKG  ED ECG REPORT I, Dionne Bucy, the attending physician, personally viewed and interpreted this ECG.  Date: 09/03/2017 EKG Time: 1255 Rate: 72 Rhythm: normal sinus rhythm QRS Axis: Right axis Intervals: normal ST/T Wave abnormalities: normal Narrative Interpretation: no evidence of acute ischemia  ____________________________________________  RADIOLOGY  CXR: Pulmonary edema; cannot exclude infiltrate  ____________________________________________   PROCEDURES  Procedure(s) performed: No  Procedures  Critical Care performed: Yes  CRITICAL CARE Performed by: Dionne Bucy   Total critical care time: 30 minutes  Critical care time was exclusive of separately billable procedures and treating other patients.  Critical care was necessary to treat or prevent imminent or life-threatening deterioration.  Critical care was time spent personally by me on the following activities: development of treatment plan with patient and/or surrogate as well as nursing, discussions with consultants, evaluation of patient's response to treatment, examination of patient, obtaining history from patient or surrogate, ordering and performing treatments and interventions, ordering and review of laboratory studies, ordering and review of radiographic studies, pulse oximetry and re-evaluation of patient's condition. ____________________________________________   INITIAL IMPRESSION / ASSESSMENT AND PLAN / ED COURSE  Pertinent labs & imaging results that were available during my care of the patient were reviewed by me and considered in my medical decision making (see chart for details).  77 year old female with  PMH as noted above presents with worsening confusion, shortness of breath, and hypoxia over the last several days.  I reviewed the past medical records in epic; the patient was admitted at the end of last month for UTI and sepsis.  She also had a perianal abscess for which an I&D was performed successfully.  On exam today, the patient is alert but confused.  Her O2 saturation is in the low 90s on nasal cannula, and the remainder of the exam is as described above.  Differential includes primarily infection; I suspect either recurrent UTI or possibly pneumonia.  I examined the area of the abscess and there is no evidence of active infection there.  Differential also includes electrolyte abnormalities other metabolic etiology, or less likely cardiac cause.  We will obtain chest x-ray, labs, sepsis work-up, give fluids, and reassess.  Clinical Course as of Sep 03 1612  Fri Sep 03, 2017  1500 Comprehensive metabolic panel [SS]  1512 DG Chest Elaine 1 View [JW]    Clinical Course User Index [JW] Emily Filbert, MD [SS] Dionne Bucy, MD   ----------------------------------------- 4:13 PM on 09/03/2017 -----------------------------------------  Chest x-ray shows edema and cannot exclude underlying infiltrate.  I discontinued the IV fluids for now, however if labs are consistent with sepsis we will restart them.  The VBG revealed significant hypercapnia which I suspect could be contributing to patient's symptoms.  I ordered broad-spectrum antibiotics for HCAP, as well as BiPAP to help the patient with ventilation.  I discussed the results of the work-up and the plan of care with the family.  The patient is still pending some of her work-up.  I discussed her case with the hospitalist Dr. Elisabeth Pigeon.  I signed the patient out to the oncoming physician Dr. Mayford Knife who will complete sign out to hospitalist when the work-up  is resulted.  ____________________________________________   FINAL  CLINICAL IMPRESSION(S) / ED DIAGNOSES  Final diagnoses:  HCAP (healthcare-associated pneumonia)  Acute respiratory failure with hypoxia and hypercapnia (HCC)      NEW MEDICATIONS STARTED DURING THIS VISIT:  New Prescriptions   No medications on file     Note:  This document was prepared using Dragon voice recognition software and may include unintentional dictation errors.    Dionne Bucy, MD 09/03/17 [phone]

## 2017-09-03 NOTE — ED Notes (Signed)
Pt was a difficult stick. After 3 RN attempts EDP placed IV with US. For this reason 1 Blood Culture is sent and the second is not collected.

## 2017-09-03 NOTE — ED Notes (Signed)
Respiratory contacted to place pt on bipap.

## 2017-09-03 NOTE — Progress Notes (Signed)
Family Meeting Note  Advance Directive:yes  Today a meeting took place with the spouse and 2 daughters.  Patient is unable to participate due [id number]:Lacked capacity lethargy   The following clinical team members were present during this meeting:MD  The following were discussed:Patient's diagnosis: Sepsis, recent abscess and surgery, acute renal failure, hyperkalemia, Patient's progosis: Unable to determine and Goals for treatment: DNR  Additional follow-up to be provided: Intensivist and nephrologist.  Time spent during discussion:20 minutes  Brenda DillingVaibhavkumar Tinzley Dalia, MD

## 2017-09-03 NOTE — ED Notes (Signed)
Date and time results received: 09/03/17  Test: ph venous  Critical Value:7.16  Name of Provider Notified: siadecki  Orders Received? Or Actions Taken?: MD notified

## 2017-09-03 NOTE — ED Notes (Addendum)
Bladder scan reading of greater than 500

## 2017-09-03 NOTE — H&P (Signed)
[employer] [facility] [place] [facility]   PATIENT NAME: Brenda Glenn    MR#:  [id number]  DATE OF BIRTH:  Jul 21, 1940  DATE OF ADMISSION:  09/03/2017  PRIMARY CARE PHYSICIAN: Mickey Farber, MD   REQUESTING/REFERRING PHYSICIAN: Siadecki  CHIEF COMPLAINT:   Chief Complaint  Patient presents with  . Shortness of Breath    HISTORY OF PRESENT ILLNESS: Brenda Glenn  is a 77 y.o. female with a known history of arthritis, COPD with chronic oxygen use at 2 L, diabetes, gastroesophageal reflux disease, heart murmur, hypercholesterolemia, hypertension, pneumonia, renal insufficiency-was admitted to hospital last week with perirectal horseshoe abscess-I&D was done, was discharged with oral Bactrim 1 week ago.  She was sent to rehab center because of her declining functional status. At the rehab for last 1 week she has continued to progressively get worse to the point now staying very lethargic, not eating much, not having much urination. Concern with this she was sent to emergency room and noted to be septic.  She required BiPAP in the emergency room to maintain her saturation and she has worsening in her renal function compared to last week.  Foley catheter was placed and it drained 400 mL's urine so it was suspected to have some outlet obstruction on her bladder. Nephrology saw the patient and decided to treat conservatively for today and if needed then dialysis will be started tomorrow.  PAST MEDICAL HISTORY:   Past Medical History:  Diagnosis Date  . Arthritis    all over  . Back pain   . COPD (chronic obstructive pulmonary disease) (HCC)    O2 use continuosly at 2 liters/ some  asthma symptoms  . Diabetes mellitus without complication (HCC)    diet controlled  . GERD (gastroesophageal reflux disease)   . Heart murmur    lifelong  . Hypercholesterolemia   . Hypertension    controlled on meds  . Pneumonia 2015   [facility] hospitalized  . Renal insufficiency    early stage kidney  failure  . Shortness of breath dyspnea   . Sleep apnea    2nd sleep study said pt does not have sleep apnea  . Swelling    of feet and legs    PAST SURGICAL HISTORY:  Past Surgical History:  Procedure Laterality Date  . ABDOMINAL HYSTERECTOMY  1988  . BACK SURGERY  1978 and 1995   residual nerve damage legs-weakness  . BLADDER SURGERY  2017  . CATARACT EXTRACTION Right   . CATARACT EXTRACTION W/PHACO Left 09/05/2014   Procedure: CATARACT EXTRACTION PHACO AND INTRAOCULAR LENS PLACEMENT (IOC);  Surgeon: Lockie Mola, MD;  Location: [facility];  Service: Ophthalmology;  Laterality: Left;  DIABETIC  . CHOLECYSTECTOMY  2011  . INCISION AND DRAINAGE PERIRECTAL ABSCESS N/A 08/20/2017   Procedure: IRRIGATION AND DEBRIDEMENT PERIRECTAL ABSCESS;  Surgeon: Leafy Ro, MD;  Location: [facility] OR[facility];  Service: General;  Laterality: N/A;  . JOINT REPLACEMENT Right 2009  . RECTAL EXAM UNDER ANESTHESIA N/A 08/20/2017   Procedure: RECTAL EXAM UNDER ANESTHESIA;  Surgeon: Leafy Ro, MD;  Location: [facility] ORS;  Service: General;  Laterality: N/A;  . REPLACEMENT TOTAL KNEE Right   . REVERSE SHOULDER ARTHROPLASTY Right 01/08/2017   Procedure: REVERSE SHOULDER ARTHROPLASTY;  Surgeon: Signa Kell, MD;  Location: [facility] ORS;  Service: Orthopedics;  Laterality: Right;    SOCIAL HISTORY:  Social History   Tobacco Use  . Smoking status: Current Every Day Smoker    Packs/day: 0.25  Years: 50.00    Pack years: 12.50    Types: Cigarettes  . Smokeless tobacco: Never Used  Substance Use Topics  . Alcohol use: Yes    Alcohol/week: 1.8 oz    Types: 3 Glasses of wine per week    Comment: rarely    FAMILY HISTORY:  Family History  Problem Relation Age of Onset  . Breast cancer Mother 6175  . Breast cancer Paternal Grandmother [id number]    DRUG ALLERGIES:  Allergies  Allergen Reactions  . Tequin [Gatifloxacin] Shortness Of Breath  . Bupropion     Other reaction(s): Other (See  Comments) shaking  . Keflex [Cephalexin] Other (See Comments)    Unknown  . Lisinopril Other (See Comments)    Unknown  . Varenicline Nausea Only and Nausea And Vomiting    REVIEW OF SYSTEMS:   Patient was agitated when I arrived, given Ativan injection and started on BiPAP so currently not able to give me a review of system.  MEDICATIONS AT HOME:  Prior to Admission medications   Medication Sig Start Date End Date Taking? Authorizing Provider  albuterol (PROVENTIL HFA;VENTOLIN HFA) 108 (90 Base) MCG/ACT inhaler Inhale 2 puffs into the lungs every 6 (six) hours as needed for wheezing or shortness of breath.   Yes [provider]  allopurinol (ZYLOPRIM) 100 MG tablet Take 200 mg by mouth daily.    Yes [provider]  aspirin EC 81 MG tablet Take 81 mg by mouth daily.    Yes [provider]  bisacodyl (DULCOLAX) 5 MG EC tablet Take 2 tablets (10 mg total) by mouth daily. 08/27/17  Yes Shaune Pollackhen, Qing, MD  calcium carbonate (TUMS - DOSED IN MG ELEMENTAL CALCIUM) 500 MG chewable tablet Chew 2 tablets (1,000 mg total) by mouth 3 (three) times daily with meals. 08/26/17  Yes Shaune Pollackhen, Qing, MD  diclofenac (FLECTOR) 1.3 % PTCH Place 1 patch onto the skin daily as needed (for pain).   Yes [provider]  docusate sodium (COLACE) 100 MG capsule Take 1 capsule (100 mg total) by mouth 2 (two) times daily. 08/26/17  Yes Shaune Pollackhen, Qing, MD  ezetimibe (ZETIA) 10 MG tablet Take 10 mg by mouth at bedtime.   Yes [provider]  felodipine (PLENDIL) 10 MG 24 hr tablet Take 10 mg by mouth at bedtime.   Yes [provider]  fluticasone (FLONASE) 50 MCG/ACT nasal spray Place 2 sprays into both nostrils daily as needed for allergies. 12/08/16  Yes [provider]  fluticasone-salmeterol (ADVAIR HFA) 115-21 MCG/ACT inhaler Inhale 2 puffs into the lungs 2 (two) times daily.    Yes [provider]  folic acid (FOLVITE) 1 MG tablet Take 1 tablet (1 mg total)  by mouth daily. 08/27/17  Yes Shaune Pollackhen, Qing, MD  gabapentin (NEURONTIN) 400 MG capsule Take 1 capsule (400 mg total) by mouth 3 (three) times daily. 08/26/17  Yes Shaune Pollackhen, Qing, MD  guaiFENesin (MUCINEX) 600 MG 12 hr tablet Take 600 mg by mouth every 12 (twelve) hours as needed for cough.   Yes [provider]  LORazepam (ATIVAN) 0.5 MG tablet Take 0.5 mg by mouth every 4 (four) hours as needed for anxiety.   Yes [provider]  metoprolol succinate (TOPROL-XL) 100 MG 24 hr tablet Take 100 mg by mouth daily.    Yes [provider]  nicotine (NICODERM CQ - DOSED IN MG/24 HOURS) 14 mg/24hr patch APP 1 PA EXT TO THE SKIN D 07/01/17  Yes [provider]  omeprazole (PRILOSEC) 40 MG capsule Take 40 mg by mouth daily.    Yes [provider]  oxyCODONE (ROXICODONE) 15 MG immediate release tablet Take 1 tablet (15 mg total) by mouth every 6 (six) hours. 08/26/17  Yes Shaune Pollack, MD  pramipexole (MIRAPEX) 0.5 MG tablet Take 1 mg by mouth at bedtime.    Yes [provider]  sulfamethoxazole-trimethoprim (BACTRIM DS,SEPTRA DS) 800-160 MG tablet Take 1 tablet by mouth every 12 (twelve) hours. 08/26/17  Yes Shaune Pollack, MD  theophylline (UNIPHYL) 400 MG 24 hr tablet Take 1 tablet by mouth daily.  08/17/17  Yes [provider]  tiotropium (SPIRIVA) 18 MCG inhalation capsule Place 18 mcg into inhaler and inhale daily.    Yes [provider]  Vitamin D, Ergocalciferol, (DRISDOL) 50000 UNITS CAPS capsule Take 50,000 Units by mouth every 14 (fourteen) days.   Yes [provider]      PHYSICAL EXAMINATION:   VITAL SIGNS: Blood pressure (!) 86/51, pulse 63, temperature 97.6 F (36.4 C), temperature source Oral, resp. rate 15, height 5' (1.524 m), weight 84.8 kg (187 lb), SpO2 97 %.  GENERAL:  77 y.o.-year-old patient lying in the bed with acute distress- critical appearance.  EYES: Pupils equal, round, reactive to light and accommodation. No  scleral icterus. Extraocular muscles intact.  HEENT: Head atraumatic, normocephalic. Oropharynx and nasopharynx clear.  NECK:  Supple, no jugular venous distention. No thyroid enlargement, no tenderness.  LUNGS: Normal breath sounds bilaterally, no wheezing, rales,have crepitation. positive use of accessory muscles of respiration. On BIPAP. CARDIOVASCULAR: S1, S2 normal. No murmurs, rubs, or gallops.  ABDOMEN: Soft, nontender, nondistended. Bowel sounds present. No organomegaly or mass.  EXTREMITIES: No pedal edema, cyanosis, or clubbing.  NEUROLOGIC: pt is sedated after ativan injection, not following commands, on bipap. PSYCHIATRIC: The patient is lethargic, sedated.  SKIN: No obvious rash, lesion, or ulcer.   LABORATORY PANEL:   CBC Recent Labs  Lab 09/03/17 1452  WBC 3.1*  HGB 8.1*  HCT 26.0*  PLT 378  MCV 99.3  MCH 30.9  MCHC 31.1*  RDW 19.2*  LYMPHSABS 0.3*  MONOABS 0.3  EOSABS 0.0  BASOSABS 0.0   ------------------------------------------------------------------------------------------------------------------  Chemistries  Recent Labs  Lab 09/03/17 1452  NA 138  K 7.2*  CL 108  CO2 23  GLUCOSE 89  BUN 37*  CREATININE 2.32*  CALCIUM 7.5*  AST 24  ALT 13  ALKPHOS 84  BILITOT 0.7   ------------------------------------------------------------------------------------------------------------------ estimated creatinine clearance is 19.9 mL/min (A) (by C-G formula based on SCr of 2.32 mg/dL (H)). ------------------------------------------------------------------------------------------------------------------ No results for input(s): TSH, T4TOTAL, T3FREE, THYROIDAB in the last 72 hours.  Invalid input(s): FREET3   Coagulation profile No results for input(s): INR, PROTIME in the last 168 hours. ------------------------------------------------------------------------------------------------------------------- No results for input(s): DDIMER in the last 72  hours. -------------------------------------------------------------------------------------------------------------------  Cardiac Enzymes Recent Labs  Lab 09/03/17 1452  TROPONINI <0.03   ------------------------------------------------------------------------------------------------------------------ Invalid input(s): POCBNP  ---------------------------------------------------------------------------------------------------------------  Urinalysis    Component Value Date/Time   COLORURINE AMBER (A) 09/03/2017 1324   APPEARANCEUR CLOUDY (A) 09/03/2017 1324   APPEARANCEUR Clear 02/20/2013 1919   LABSPEC 1.018 09/03/2017 1324   LABSPEC 1.012 02/20/2013 1919   PHURINE 5.0 09/03/2017 1324   GLUCOSEU NEGATIVE 09/03/2017 1324   GLUCOSEU Negative 02/20/2013 1919   HGBUR NEGATIVE 09/03/2017 1324   BILIRUBINUR NEGATIVE 09/03/2017 1324   BILIRUBINUR Negative 02/20/2013 1919   KETONESUR NEGATIVE 09/03/2017 1324   PROTEINUR 100 (A) 09/03/2017 1324   NITRITE NEGATIVE 09/03/2017  1324   LEUKOCYTESUR NEGATIVE 09/03/2017 1324   LEUKOCYTESUR Negative 02/20/2013 1919     RADIOLOGY: Dg Chest Port 1 View  Result Date: 09/03/2017 CLINICAL DATA:  77 year old female with a history of poor oxygenation EXAM: PORTABLE CHEST 1 VIEW COMPARISON:  08/15/2017, CT 08/15/2017 FINDINGS: Cardiomediastinal silhouette unchanged in size and contour. Mixed interstitial and airspace opacities the bilateral lungs, increased from the comparison. Blunting of the left costophrenic angle with retrocardiac opacity. Interlobular septal thickening. Interval removal of left IJ central catheter. Surgical changes of the right glenohumeral joint. IMPRESSION: Chest x-ray suggests pulmonary edema and small left pleural effusion. Infection cannot be excluded. Interval removal of left IJ central venous catheter. Electronically Signed   By: Gilmer Mor D.O.   On: 09/03/2017 14:33    EKG: Orders placed or performed during the  hospital encounter of 09/03/17  . EKG 12-Lead  . EKG 12-Lead    IMPRESSION AND PLAN:  *Sepsis Secondary to horseshoe abscess Possible healthcare associated pneumonia  Start on broad-spectrum IV antibiotics.  Send cx  Recent I & D, have penrose catheter- called surgical consult.  * Ac on ch respi failure    Continue BiPAP, monitor in ICU.    Continue nebulizer therapy.  *Acute renal failure Hyperkalemia  Given calcium gluconate, insulin plus dextrose, [place].  Nephrology consult  IV fluids for now.  * COPD   Cont theophylline, Nebs.  *Anemia Continue to monitor, if drops we may need to have blood transfusion.  All the records are reviewed and case discussed with ED provider. Management plans discussed with the patient, family and they are in agreement.  CODE STATUS: DNR    Code Status Orders  (From admission, onward)        Start     Ordered   09/03/17 1756  Do not attempt resuscitation (DNR)  Continuous    Question Answer Comment  In the event of cardiac or respiratory ARREST Do not call a "code blue"   In the event of cardiac or respiratory ARREST Do not perform Intubation, CPR, defibrillation or ACLS   In the event of cardiac or respiratory ARREST Use medication by any route, position, wound care, and other measures to relive pain and suffering. May use oxygen, suction and manual treatment of airway obstruction as needed for comfort.   Comments confirmed with family.      09/03/17 1755    Code Status History    Date Active Date Inactive Code Status Order ID Comments User Context   08/15/2017 2029 08/26/2017 2255 Full Code [id number]  Auburn Bilberry, MD Inpatient   06/20/2017 1157 06/21/2017 1837 DNR [id number]  Shaune Pollack, MD Inpatient   01/08/2017 1422 01/09/2017 1959 Full Code [id number]  Signa Kell, MD Inpatient     Patient's husband and 2 daughters were present in the room during my visit.  Case discussed with ICU physician Dr. Belia Heman and  nephrologist.  TOTAL TIME TAKING CARE OF THIS PATIENT: 60 minutes.    Altamese Dilling M.D on 09/03/2017   Between 7am to 6pm - Pager - [phone]  After 6pm go to [email or web address] - Social research officer, government  [facility]  Office  [phone]  CC: Primary care physician; Mickey Farber, MD   Note: This dictation was prepared with Dragon dictation along with smaller phrase technology. Any transcriptional errors that result from this process are unintentional.

## 2017-09-03 NOTE — Consult Note (Addendum)
PULMONARY / CRITICAL CARE MEDICINE   Name: Brenda Glenn MRN: [id number] DOB: 1940/07/19    ADMISSION DATE:  09/03/2017   CONSULTATION DATE:  09/03/2017  REFERRING MD:  Dr Madelon LipsVacchani  REASON: Severe hyperkalemia and acute renal failure  HISTORY OF PRESENT ILLNESS:   This is a 77 y/o female with a medical history as indicated below, recent hospitalization for UTI and perianal abscess, who presented to the ED from SNF with complaints of hypoxemia with SPO2 in the 40s, worsening confusion, generalized weakness and difficulty breathing over a couple of days.  At the ED, patient was hypothermic and hypotensive but blood pressure improved with gentle fluid resuscitation.  Her ED work-up revealed a potassium level of 7.2, creatinine of 2.32 up from her baseline of 0.78 hemoglobin of 8.1.  She was given IV dextrose, insulin and calcium gluconate.  Her repeat potassium was 6.8 hence the decision was made to proceed with hemodialysis.  A right femoral try dialysis catheter has been placed and patient will be dialyzed tonight. She remains hypoxic requiring continuous BiPAP  PAST MEDICAL HISTORY :  She  has a past medical history of Arthritis, Back pain, COPD (chronic obstructive pulmonary disease) (HCC), Diabetes mellitus without complication (HCC), GERD (gastroesophageal reflux disease), Heart murmur, Hypercholesterolemia, Hypertension, Pneumonia (2015), Renal insufficiency, Shortness of breath dyspnea, Sleep apnea, and Swelling.  PAST SURGICAL HISTORY: She  has a past surgical history that includes Cataract extraction (Right); Cholecystectomy (2011); Abdominal hysterectomy (1988); Replacement total knee (Right); Back surgery (1978 and 1995); Cataract extraction w/PHACO (Left, 09/05/2014); Joint replacement (Right, 2009); Bladder surgery (2017); Reverse shoulder arthroplasty (Right, 01/08/2017); Rectal exam under anesthesia (N/A, 08/20/2017); and Incision and drainage perirectal abscess (N/A,  08/20/2017).  Allergies  Allergen Reactions  . Tequin [Gatifloxacin] Shortness Of Breath  . Bupropion     Other reaction(s): Other (See Comments) shaking  . Keflex [Cephalexin] Other (See Comments)    Unknown  . Lisinopril Other (See Comments)    Unknown  . Varenicline Nausea Only and Nausea And Vomiting    No current facility-administered medications on file prior to encounter.    Current Outpatient Medications on File Prior to Encounter  Medication Sig  . albuterol (PROVENTIL HFA;VENTOLIN HFA) 108 (90 Base) MCG/ACT inhaler Inhale 2 puffs into the lungs every 6 (six) hours as needed for wheezing or shortness of breath.  . allopurinol (ZYLOPRIM) 100 MG tablet Take 200 mg by mouth daily.   Marland Kitchen. aspirin EC 81 MG tablet Take 81 mg by mouth daily.   . bisacodyl (DULCOLAX) 5 MG EC tablet Take 2 tablets (10 mg total) by mouth daily.  . calcium carbonate (TUMS - DOSED IN MG ELEMENTAL CALCIUM) 500 MG chewable tablet Chew 2 tablets (1,000 mg total) by mouth 3 (three) times daily with meals.  . diclofenac (FLECTOR) 1.3 % PTCH Place 1 patch onto the skin daily as needed (for pain).  Marland Kitchen. docusate sodium (COLACE) 100 MG capsule Take 1 capsule (100 mg total) by mouth 2 (two) times daily.  Marland Kitchen. ezetimibe (ZETIA) 10 MG tablet Take 10 mg by mouth at bedtime.  . felodipine (PLENDIL) 10 MG 24 hr tablet Take 10 mg by mouth at bedtime.  . fluticasone (FLONASE) 50 MCG/ACT nasal spray Place 2 sprays into both nostrils daily as needed for allergies.  . fluticasone-salmeterol (ADVAIR HFA) 115-21 MCG/ACT inhaler Inhale 2 puffs into the lungs 2 (two) times daily.   . folic acid (FOLVITE) 1 MG tablet Take 1 tablet (1 mg total) by mouth daily.  .Marland Kitchen  gabapentin (NEURONTIN) 400 MG capsule Take 1 capsule (400 mg total) by mouth 3 (three) times daily.  Marland Kitchen guaiFENesin (MUCINEX) 600 MG 12 hr tablet Take 600 mg by mouth every 12 (twelve) hours as needed for cough.  Marland Kitchen LORazepam (ATIVAN) 0.5 MG tablet Take 0.5 mg by mouth every 4  (four) hours as needed for anxiety.  . metoprolol succinate (TOPROL-XL) 100 MG 24 hr tablet Take 100 mg by mouth daily.   . nicotine (NICODERM CQ - DOSED IN MG/24 HOURS) 14 mg/24hr patch APP 1 PA EXT TO THE SKIN D  . omeprazole (PRILOSEC) 40 MG capsule Take 40 mg by mouth daily.   Marland Kitchen oxyCODONE (ROXICODONE) 15 MG immediate release tablet Take 1 tablet (15 mg total) by mouth every 6 (six) hours.  . pramipexole (MIRAPEX) 0.5 MG tablet Take 1 mg by mouth at bedtime.   . sulfamethoxazole-trimethoprim (BACTRIM DS,SEPTRA DS) 800-160 MG tablet Take 1 tablet by mouth every 12 (twelve) hours.  . theophylline (UNIPHYL) 400 MG 24 hr tablet Take 1 tablet by mouth daily.   Marland Kitchen tiotropium (SPIRIVA) 18 MCG inhalation capsule Place 18 mcg into inhaler and inhale daily.   . Vitamin D, Ergocalciferol, (DRISDOL) 50000 UNITS CAPS capsule Take 50,000 Units by mouth every 14 (fourteen) days.    FAMILY HISTORY:  Her indicated that her mother is deceased. She indicated that her paternal grandmother is deceased.   SOCIAL HISTORY: She  reports that she has been smoking cigarettes.  She has a 12.50 pack-year smoking history. She has never used smokeless tobacco. She reports that she drinks about 1.8 oz of alcohol per week. She reports that she does not use drugs.  REVIEW OF SYSTEMS:   But to obtain as patient is confused  SUBJECTIVE:    VITAL SIGNS: BP (!) 72/41   Pulse 66   Temp 97.6 F (36.4 C) (Oral)   Resp 13   Ht 5\' 2"  (1.575 m)   Wt 183 lb 13.8 oz (83.4 kg)   SpO2 94%   BMI 33.63 kg/m   HEMODYNAMICS:    VENTILATOR SETTINGS:    INTAKE / OUTPUT: I/O last 3 completed shifts: In: 300 [IV Piggyback:300] Out: -   PHYSICAL EXAMINATION: General: Appears acutely ill, restless Neuro: Draws to pain and noxious stimulus, unable to follow commands HEENT: PERRLA, trachea midline, no JVD Cardiovascular: Apical pulse regular, S1-S2, no murmur regurg or gallop, +2 pulses bilaterally, no edema Lungs:  Bilateral breath sounds, diminished in the bases, no wheezes or rhonchi Abdomen: Obese, normal bowel sounds in all 4 quadrants, palpation reveals no organomegaly Musculoskeletal: No joint deformity, positive range of motion in upper and lower extremities Skin: Warm and dry  LABS:  BMET Recent Labs  Lab 09/03/17 1452  NA 138  K 7.2*  CL 108  CO2 23  BUN 37*  CREATININE 2.32*  GLUCOSE 89    Electrolytes Recent Labs  Lab 09/03/17 1452  CALCIUM 7.5*    CBC Recent Labs  Lab 09/03/17 1452  WBC 3.1*  HGB 8.1*  HCT 26.0*  PLT 378    Coag's No results for input(s): APTT, INR in the last 168 hours.  Sepsis Markers No results for input(s): LATICACIDVEN, PROCALCITON, O2SATVEN in the last 168 hours.  ABG No results for input(s): PHART, PCO2ART, PO2ART in the last 168 hours.  Liver Enzymes Recent Labs  Lab 09/03/17 1452  AST 24  ALT 13  ALKPHOS 84  BILITOT 0.7  ALBUMIN 2.7*    Cardiac Enzymes Recent Labs  Lab 09/03/17 1452  TROPONINI <0.03    Glucose Recent Labs  Lab 09/03/17 1856 09/03/17 1858  GLUCAP 73 71    Imaging Dg Chest Port 1 View  Result Date: 09/03/2017 CLINICAL DATA:  77 year old female with a history of poor oxygenation EXAM: PORTABLE CHEST 1 VIEW COMPARISON:  08/15/2017, CT 08/15/2017 FINDINGS: Cardiomediastinal silhouette unchanged in size and contour. Mixed interstitial and airspace opacities the bilateral lungs, increased from the comparison. Blunting of the left costophrenic angle with retrocardiac opacity. Interlobular septal thickening. Interval removal of left IJ central catheter. Surgical changes of the right glenohumeral joint. IMPRESSION: Chest x-ray suggests pulmonary edema and small left pleural effusion. Infection cannot be excluded. Interval removal of left IJ central venous catheter. Electronically Signed   By: Gilmer Mor D.O.   On: 09/03/2017 14:33     CULTURES: Blood cultures  x2  ANTIBIOTICS: Vancomycin Zosyn  SIGNIFICANT EVENTS: 09/03/2017: Admitted  LINES/TUBES: Right femoral try dialysis catheter Peripheral IVs Foley catheter  DISCUSSION: 77 year old female presenting with severe hypokalemia and acute renal failure secondary to sepsis  ASSESSMENT Severe hyperkalemia Sepsis of unknown source-most likely pulmonary; patient is being covered empirically for HCAP Septic shock shock HCAP Acute hypoxic/hypercarbic respiratory failure Acute renal failure Acute pulmonary edema Urinary retention Acute metabolic encephalopathy  PLAN More dynamic monitoring per ICU Hemodialysis per nephrology Hold NSAIDs gabapentin and Bactrim and avoid IV contrast per nephrology Trend creatinine and potassium level Antibiotics as above Continues BiPAP and titrate to nasal cannula as tolerated Nebulized bronchodilators and inhaled steroids And procalcitonin and adjust antibiotics Continue all home medications except for antihypertensives GI and DVT prophylaxis  FAMILY  - Updates: No family at bedside.  Responsible party updated by phone about dialysis by the patient's nurse.  - Inter-disciplinary family meet or Palliative Care meeting due by:  day 7  [facility] ANP-BC Pulmonary and Critical Care Medicine [facility] Pager [phone] or [phone]  NB: This document was prepared using Dragon voice recognition software and may include unintentional dictation errors.    09/03/2017, 8:06 PM

## 2017-09-03 NOTE — Consult Note (Addendum)
Date: 09/03/2017                  Patient Name:  Brenda Glenn  MRN: [id number]  DOB: 1940-05-26  Age / Sex: 77 y.o., female         PCP: Mickey Farberhies, David, MD                 Service Requesting Consult: IM/ No att. providers found                 Reason for Consult: ARF, hyperkalemia            History of Present Illness: Patient is a 77 y.o. female with medical problems of COPD, diabetes, GERD, who was admitted to [facility] on 09/03/2017 for evaluation of lethargy, decreased appetite, acute confusion.  According to family, patient was discharged last week to [facility].  She did well for the first 2 to 3 days but then started to have decreased appetite.  Yesterday, she started to become confused but this morning her daughter reports that she was very confused.  She was also short of breath.  Her oxygen saturation was noted to be low at the nursing home.  She has had decreased appetite all last week. In the emergency room she is hypotensive.  Chest x-ray showed pulmonary edema.  She was placed on BiPAP.  Her ABG shows acute respiratory acidosis with pH of 7.16, PCO2 of 67.  In addition she was also found to have acute renal failure with creatinine of 2.32, potassium 7.2. Her baseline creatinine is 0.78 from August 26, 2017 In the ER, a Foley catheter was placed and 450 cc of urine was obtained suggesting urinary retention Patient is now being admitted for further evaluation and management   Medications: Outpatient medications:  (Not in a hospital admission)  Current medications: Current Facility-Administered Medications  Medication Dose Route Frequency Provider Last Rate Last Dose  . calcium gluconate 1 g in sodium chloride 0.9 % 100 mL IVPB  1 g Intravenous Once Emily FilbertWilliams, Jonathan E, MD      . patiromer Lelon Perla(VELTASSA) packet 8.4 g  8.4 g Oral Daily Emily FilbertWilliams, Jonathan E, MD       Current Outpatient Medications  Medication Sig Dispense Refill  . albuterol (PROVENTIL HFA;VENTOLIN HFA) 108  (90 Base) MCG/ACT inhaler Inhale 2 puffs into the lungs every 6 (six) hours as needed for wheezing or shortness of breath.    . allopurinol (ZYLOPRIM) 100 MG tablet Take 200 mg by mouth daily.     Marland Kitchen. aspirin EC 81 MG tablet Take 81 mg by mouth daily.     . bisacodyl (DULCOLAX) 5 MG EC tablet Take 2 tablets (10 mg total) by mouth daily. 30 tablet 0  . calcium carbonate (TUMS - DOSED IN MG ELEMENTAL CALCIUM) 500 MG chewable tablet Chew 2 tablets (1,000 mg total) by mouth 3 (three) times daily with meals.    . diclofenac (FLECTOR) 1.3 % PTCH Place 1 patch onto the skin daily as needed (for pain).    Marland Kitchen. docusate sodium (COLACE) 100 MG capsule Take 1 capsule (100 mg total) by mouth 2 (two) times daily. 10 capsule 0  . ezetimibe (ZETIA) 10 MG tablet Take 10 mg by mouth at bedtime.    . felodipine (PLENDIL) 10 MG 24 hr tablet Take 10 mg by mouth at bedtime.    . fluticasone (FLONASE) 50 MCG/ACT nasal spray Place 2 sprays into both nostrils daily as needed for allergies.    .Marland Kitchen  fluticasone-salmeterol (ADVAIR HFA) 115-21 MCG/ACT inhaler Inhale 2 puffs into the lungs 2 (two) times daily.     . folic acid (FOLVITE) 1 MG tablet Take 1 tablet (1 mg total) by mouth daily.    Marland Kitchen gabapentin (NEURONTIN) 400 MG capsule Take 1 capsule (400 mg total) by mouth 3 (three) times daily.    Marland Kitchen guaiFENesin (MUCINEX) 600 MG 12 hr tablet Take 600 mg by mouth every 12 (twelve) hours as needed for cough.    Marland Kitchen LORazepam (ATIVAN) 0.5 MG tablet Take 0.5 mg by mouth every 4 (four) hours as needed for anxiety.    . metoprolol succinate (TOPROL-XL) 100 MG 24 hr tablet Take 100 mg by mouth daily.     . nicotine (NICODERM CQ - DOSED IN MG/24 HOURS) 14 mg/24hr patch APP 1 PA EXT TO THE SKIN D  0  . omeprazole (PRILOSEC) 40 MG capsule Take 40 mg by mouth daily.     Marland Kitchen oxyCODONE (ROXICODONE) 15 MG immediate release tablet Take 1 tablet (15 mg total) by mouth every 6 (six) hours. 16 tablet 0  . pramipexole (MIRAPEX) 0.5 MG tablet Take 1 mg by  mouth at bedtime.     . sulfamethoxazole-trimethoprim (BACTRIM DS,SEPTRA DS) 800-160 MG tablet Take 1 tablet by mouth every 12 (twelve) hours. 10 tablet 0  . theophylline (UNIPHYL) 400 MG 24 hr tablet Take 1 tablet by mouth daily.     Marland Kitchen tiotropium (SPIRIVA) 18 MCG inhalation capsule Place 18 mcg into inhaler and inhale daily.     . Vitamin D, Ergocalciferol, (DRISDOL) 50000 UNITS CAPS capsule Take 50,000 Units by mouth every 14 (fourteen) days.        Allergies: Allergies  Allergen Reactions  . Tequin [Gatifloxacin] Shortness Of Breath  . Bupropion     Other reaction(s): Other (See Comments) shaking  . Keflex [Cephalexin] Other (See Comments)    Unknown  . Lisinopril Other (See Comments)    Unknown  . Varenicline Nausea Only and Nausea And Vomiting      Past Medical History: Past Medical History:  Diagnosis Date  . Arthritis    all over  . Back pain   . COPD (chronic obstructive pulmonary disease) (HCC)    O2 use continuosly at 2 liters/ some  asthma symptoms  . Diabetes mellitus without complication (HCC)    diet controlled  . GERD (gastroesophageal reflux disease)   . Heart murmur    lifelong  . Hypercholesterolemia   . Hypertension    controlled on meds  . Pneumonia 2015   [facility] hospitalized  . Renal insufficiency    early stage kidney failure  . Shortness of breath dyspnea   . Sleep apnea    2nd sleep study said pt does not have sleep apnea  . Swelling    of feet and legs     Past Surgical History: Past Surgical History:  Procedure Laterality Date  . ABDOMINAL HYSTERECTOMY  1988  . BACK SURGERY  1978 and 1995   residual nerve damage legs-weakness  . BLADDER SURGERY  2017  . CATARACT EXTRACTION Right   . CATARACT EXTRACTION W/PHACO Left 09/05/2014   Procedure: CATARACT EXTRACTION PHACO AND INTRAOCULAR LENS PLACEMENT (IOC);  Surgeon: Lockie Mola, MD;  Location: [facility];  Service: Ophthalmology;  Laterality: Left;  DIABETIC  .  CHOLECYSTECTOMY  2011  . INCISION AND DRAINAGE PERIRECTAL ABSCESS N/A 08/20/2017   Procedure: IRRIGATION AND DEBRIDEMENT PERIRECTAL ABSCESS;  Surgeon: Leafy Ro, MD;  Location: [facility] ORS;  Service: General;  Laterality: N/A;  . JOINT REPLACEMENT Right 2009  . RECTAL EXAM UNDER ANESTHESIA N/A 08/20/2017   Procedure: RECTAL EXAM UNDER ANESTHESIA;  Surgeon: Leafy Ro, MD;  Location: [facility] ORS;  Service: General;  Laterality: N/A;  . REPLACEMENT TOTAL KNEE Right   . REVERSE SHOULDER ARTHROPLASTY Right 01/08/2017   Procedure: REVERSE SHOULDER ARTHROPLASTY;  Surgeon: Signa Kell, MD;  Location: [facility] ORS;  Service: Orthopedics;  Laterality: Right;     Family History: Family History  Problem Relation Age of Onset  . Breast cancer Mother 46  . Breast cancer Paternal Grandmother 72     Social History: Social History   Socioeconomic History  . Marital status: Married    Spouse name: Not on file  . Number of children: Not on file  . Years of education: Not on file  . Highest education level: Not on file  Occupational History  . Not on file  Social Needs  . Financial resource strain: Not on file  . Food insecurity:    Worry: Not on file    Inability: Not on file  . Transportation needs:    Medical: Not on file    Non-medical: Not on file  Tobacco Use  . Smoking status: Current Every Day Smoker    Packs/day: 0.25    Years: 50.00    Pack years: 12.50    Types: Cigarettes  . Smokeless tobacco: Never Used  Substance and Sexual Activity  . Alcohol use: Yes    Alcohol/week: 1.8 oz    Types: 3 Glasses of wine per week    Comment: rarely  . Drug use: No  . Sexual activity: Not on file  Lifestyle  . Physical activity:    Days per week: Not on file    Minutes per session: Not on file  . Stress: Not on file  Relationships  . Social connections:    Talks on phone: Not on file    Gets together: Not on file    Attends religious service: Not on file    Active member of club  or organization: Not on file    Attends meetings of clubs or organizations: Not on file    Relationship status: Not on file  . Intimate partner violence:    Fear of current or ex partner: Not on file    Emotionally abused: Not on file    Physically abused: Not on file    Forced sexual activity: Not on file  Other Topics Concern  . Not on file  Social History Narrative  . Not on file     Review of Systems: Not available due to patient being critically ill and on BiPAP  Gen:  HEENT:  CV:  Resp:  GI: GU :  MS:  Derm:   Psych: Heme:  Neuro:  Endocrine  Vital Signs: Blood pressure (!) 102/52, pulse 69, temperature 97.6 F (36.4 C), temperature source Oral, resp. rate 13, height 5' (1.524 m), weight 84.8 kg (187 lb), SpO2 94 %.   Intake/Output Summary (Last 24 hours) at 09/03/2017 1638 Last data filed at 09/03/2017 1541 Gross per 24 hour  Intake 100 ml  Output -  Net 100 ml    Weight trends: [employer]   09/03/17 1306  Weight: 84.8 kg (187 lb)    Physical Exam: General: critically ill appearing   HEENT eyes are closed, BiPAP mask in place,   Neck: No masses  Lungs: B/l crackles, biPAP mask is on  Heart::  regular  Abdomen: Soft, obese  Extremities:  ++ edema   Neurologic: sedated  Skin: No acute rashes     Foley: present       Lab results: Basic Metabolic Panel: Recent Labs  Lab 09/03/17 1452  NA 138  K 7.2*  CL 108  CO2 23  GLUCOSE 89  BUN 37*  CREATININE 2.32*  CALCIUM 7.5*    Liver Function Tests: Recent Labs  Lab 09/03/17 1452  AST 24  ALT 13  ALKPHOS 84  BILITOT 0.7  PROT 6.2*  ALBUMIN 2.7*   No results for input(s): LIPASE, AMYLASE in the last 168 hours. No results for input(s): AMMONIA in the last 168 hours.  CBC: Recent Labs  Lab 09/03/17 1452  WBC 3.1*  NEUTROABS 2.5  HGB 8.1*  HCT 26.0*  MCV 99.3  PLT 378    Cardiac Enzymes: Recent Labs  Lab 09/03/17 1452  TROPONINI <0.03    BNP: Invalid input(s):  POCBNP  CBG: No results for input(s): GLUCAP in the last 168 hours.  Microbiology: Recent Results (from the past 720 hour(s))  Urine Culture     Status: Abnormal   Collection Time: 08/15/17  2:30 PM  Result Value Ref Range Status   Specimen Description   Final    URINE, RANDOM Performed at [facility], [address], [place], [place] [address]    Special Requests   Final    Normal Performed at [facility], [address] [place], [place] [address]    Culture (A)  Final    >=100,000 COLONIES/mL PROTEUS MIRABILIS 80,000 COLONIES/mL ESCHERICHIA COLI    Report Status 08/21/2017 FINAL  Final   Organism ID, Bacteria PROTEUS MIRABILIS (A)  Final   Organism ID, Bacteria ESCHERICHIA COLI (A)  Final      Susceptibility   Escherichia coli - MIC*    AMPICILLIN >=32 RESISTANT Resistant     CEFAZOLIN <=4 SENSITIVE Sensitive     CEFTRIAXONE <=1 SENSITIVE Sensitive     CIPROFLOXACIN 1 SENSITIVE Sensitive     GENTAMICIN <=1 SENSITIVE Sensitive     IMIPENEM <=0.25 SENSITIVE Sensitive     NITROFURANTOIN 64 INTERMEDIATE Intermediate     TRIMETH/SULFA >=320 RESISTANT Resistant     AMPICILLIN/SULBACTAM >=32 RESISTANT Resistant     PIP/TAZO <=4 SENSITIVE Sensitive     Extended ESBL NEGATIVE Sensitive     * 80,000 COLONIES/mL ESCHERICHIA COLI   Proteus mirabilis - MIC*    AMPICILLIN <=2 SENSITIVE Sensitive     CEFAZOLIN <=4 SENSITIVE Sensitive     CEFTRIAXONE <=1 SENSITIVE Sensitive     CIPROFLOXACIN <=0.25 SENSITIVE Sensitive     GENTAMICIN <=1 SENSITIVE Sensitive     IMIPENEM 2 SENSITIVE Sensitive     NITROFURANTOIN 128 RESISTANT Resistant     TRIMETH/SULFA <=20 SENSITIVE Sensitive     AMPICILLIN/SULBACTAM <=2 SENSITIVE Sensitive     PIP/TAZO <=4 SENSITIVE Sensitive     * >=100,000 COLONIES/mL PROTEUS MIRABILIS  Blood culture (single)     Status: Abnormal   Collection Time: 08/15/17  3:12 PM  Result Value Ref Range Status   Specimen Description   Final     BLOOD LEFT CHEST Performed at [facility], [address], [place], [place] [address]    Special Requests   Final    BOTTLES DRAWN AEROBIC AND ANAEROBIC Blood Culture adequate volume Performed at [facility], [address] [place], [place] [address]    Culture  Setup Time   Final  GRAM POSITIVE COCCI AEROBIC BOTTLE ONLY CRITICAL RESULT CALLED TO, READ BACK BY AND VERIFIED WITH: JASON ROBBINS 08/16/17 AT 1906 BY HS    Culture (A)  Final    STAPHYLOCOCCUS SPECIES (COAGULASE NEGATIVE) THE SIGNIFICANCE OF ISOLATING THIS ORGANISM FROM A SINGLE SET OF BLOOD CULTURES WHEN MULTIPLE SETS ARE DRAWN IS UNCERTAIN. PLEASE NOTIFY THE MICROBIOLOGY DEPARTMENT WITHIN ONE WEEK IF SPECIATION AND SENSITIVITIES ARE REQUIRED. Performed at [facility], [address], [place], [place] [address]    Report Status 08/19/2017 FINAL  Final  Blood Culture ID Panel (Reflexed)     Status: Abnormal   Collection Time: 08/15/17  3:12 PM  Result Value Ref Range Status   Enterococcus species NOT DETECTED NOT DETECTED Final   Listeria monocytogenes NOT DETECTED NOT DETECTED Final   Staphylococcus species DETECTED (A) NOT DETECTED Final    Comment: Methicillin (oxacillin) susceptible coagulase negative staphylococcus. Possible blood culture contaminant (unless isolated from more than one blood culture draw or clinical case suggests pathogenicity). No antibiotic treatment is indicated for blood  culture contaminants. CRITICAL RESULT CALLED TO, READ BACK BY AND VERIFIED WITH: JASON ROBBINS 08/16/17 AT 1906 BY HS    Staphylococcus aureus NOT DETECTED NOT DETECTED Final   Methicillin resistance NOT DETECTED NOT DETECTED Final   Streptococcus species NOT DETECTED NOT DETECTED Final   Streptococcus agalactiae NOT DETECTED NOT DETECTED Final   Streptococcus pneumoniae NOT DETECTED NOT DETECTED Final   Streptococcus pyogenes NOT DETECTED NOT DETECTED Final   Acinetobacter baumannii NOT DETECTED  NOT DETECTED Final   Enterobacteriaceae species NOT DETECTED NOT DETECTED Final   Enterobacter cloacae complex NOT DETECTED NOT DETECTED Final   Escherichia coli NOT DETECTED NOT DETECTED Final   Klebsiella oxytoca NOT DETECTED NOT DETECTED Final   Klebsiella pneumoniae NOT DETECTED NOT DETECTED Final   Proteus species NOT DETECTED NOT DETECTED Final   Serratia marcescens NOT DETECTED NOT DETECTED Final   Haemophilus influenzae NOT DETECTED NOT DETECTED Final   Neisseria meningitidis NOT DETECTED NOT DETECTED Final   Pseudomonas aeruginosa NOT DETECTED NOT DETECTED Final   Candida albicans NOT DETECTED NOT DETECTED Final   Candida glabrata NOT DETECTED NOT DETECTED Final   Candida krusei NOT DETECTED NOT DETECTED Final   Candida parapsilosis NOT DETECTED NOT DETECTED Final   Candida tropicalis NOT DETECTED NOT DETECTED Final    Comment: Performed at [facility], [address] [place], [place] [address]  MRSA PCR Screening     Status: Abnormal   Collection Time: 08/15/17 10:13 PM  Result Value Ref Range Status   MRSA by PCR POSITIVE (A) NEGATIVE Final    Comment:        The GeneXpert MRSA Assay (FDA approved for NASAL specimens only), is one component of a comprehensive MRSA colonization surveillance program. It is not intended to diagnose MRSA infection nor to guide or monitor treatment for MRSA infections. RESULT CALLED TO, READ BACK BY AND VERIFIED WITH: CHELSEY WRENN AT 0127 ON 08/16/17 RWW Performed at [facility], [address] [place], [place] [address]   Aerobic/Anaerobic Culture (surgical/deep wound)     Status: None   Collection Time: 08/20/17  4:22 PM  Result Value Ref Range Status   Specimen Description ABSCESS PERIRECTAL  Final   Special Requests NONE  Final   Gram Stain NO WBC SEEN FEW GRAM POSITIVE COCCI   Final   Culture   Final    ABUNDANT METHICILLIN RESISTANT STAPHYLOCOCCUS AUREUS NO ANAEROBES ISOLATED Performed at  [facility], [address], [place], [place] [address]    Report Status 08/26/2017 FINAL  Final   Organism ID, Bacteria METHICILLIN RESISTANT STAPHYLOCOCCUS AUREUS  Final      Susceptibility   Methicillin resistant staphylococcus aureus - MIC*    CIPROFLOXACIN >=8 RESISTANT Resistant     ERYTHROMYCIN >=8 RESISTANT Resistant     GENTAMICIN <=0.5 SENSITIVE Sensitive     OXACILLIN >=4 RESISTANT Resistant     TETRACYCLINE >=16 RESISTANT Resistant     VANCOMYCIN <=0.5 SENSITIVE Sensitive     TRIMETH/SULFA <=10 SENSITIVE Sensitive     CLINDAMYCIN >=8 RESISTANT Resistant     RIFAMPIN <=0.5 SENSITIVE Sensitive     Inducible Clindamycin NEGATIVE Sensitive     * ABUNDANT METHICILLIN RESISTANT STAPHYLOCOCCUS AUREUS  CULTURE, BLOOD (ROUTINE X 2) w Reflex to ID Panel     Status: None   Collection Time: 08/23/17  6:27 PM  Result Value Ref Range Status   Specimen Description BLOOD LEFT AC  Final   Special Requests   Final    BOTTLES DRAWN AEROBIC AND ANAEROBIC Blood Culture adequate volume   Culture   Final    NO GROWTH 5 DAYS Performed at [facility], [address] [place], [place] [address]    Report Status 08/28/2017 FINAL  Final  CULTURE, BLOOD (ROUTINE X 2) w Reflex to ID Panel     Status: None   Collection Time: 08/23/17  8:13 PM  Result Value Ref Range Status   Specimen Description BLOOD LEFT [facility]  Final   Special Requests   Final    BOTTLES DRAWN AEROBIC AND ANAEROBIC Blood Culture adequate volume   Culture   Final    NO GROWTH 5 DAYS Performed at [facility], [address] [place], [place] [address]    Report Status 08/28/2017 FINAL  Final     Coagulation Studies: No results for input(s): LABPROT, INR in the last 72 hours.  Urinalysis: Recent Labs    09/03/17 1324  COLORURINE AMBER*  LABSPEC 1.018  PHURINE 5.0  GLUCOSEU NEGATIVE  HGBUR NEGATIVE  BILIRUBINUR NEGATIVE  KETONESUR NEGATIVE  PROTEINUR 100*  NITRITE NEGATIVE  LEUKOCYTESUR NEGATIVE         Imaging: Dg Chest Port 1 View  Result Date: 09/03/2017 CLINICAL DATA:  77 year old female with a history of poor oxygenation EXAM: PORTABLE CHEST 1 VIEW COMPARISON:  08/15/2017, CT 08/15/2017 FINDINGS: Cardiomediastinal silhouette unchanged in size and contour. Mixed interstitial and airspace opacities the bilateral lungs, increased from the comparison. Blunting of the left costophrenic angle with retrocardiac opacity. Interlobular septal thickening. Interval removal of left IJ central catheter. Surgical changes of the right glenohumeral joint. IMPRESSION: Chest x-ray suggests pulmonary edema and small left pleural effusion. Infection cannot be excluded. Interval removal of left IJ central venous catheter. Electronically Signed   By: Gilmer Mor D.O.   On: 09/03/2017 14:33      Assessment & Plan: Pt is a 77 y.o. caucasian  female with medical problems of COPD, diabetes, GERD, who was admitted to [facility] on 09/03/2017 for evaluation of lethargy, decreased appetite, acute confusion.   1. Acute renal failure 2. Hyperkalemia 3. Acute pulmonary edema 4. Generalized edema 5. Urinary retention, likely from narcotics 5. Altered mental status  Likely multifactorial with contribution from hypotension, Bactrim,  Decreased level of consciousness from gabapentin  Recommend to hold bactrim, Gabapentin, Avoid NSAIDs, iv contrast Patient has volume overload and severe hyperkalemia,  Shifting measures have been ordered.  Repeat stat BMP If K is still critically high, will perform HD to correct hyperkalemia Discussed with family, Dr Desiree Hane and Dr Belia Heman        LOS: 0 Mosetta Pigeon 7/5/20194:38 PM  [facility] [place], [place] [phone]  Note: This note was prepared with Dragon dictation. Any transcription errors are unintentional

## 2017-09-03 NOTE — Progress Notes (Signed)
Pharmacy Antibiotic Note  Brenda Glenn is a 77 y.o. female admitted on 09/03/2017 with perirectal abscess.  Pharmacy has been consulted for vancomycin and Zosyn dosing.  Plan: Zosyn 3.375 g EI q 12 hours.   Vancomycin 1000 mg iv once followed by 750 mg iv q 36 hours with stacked dosing. Will follow renal function closely and adjust dosing/check levels as clinically indicated.   Height: 5' (152.4 cm) Weight: 187 lb (84.8 kg) IBW/kg (Calculated) : 45.5  Temp (24hrs), Avg:97.6 F (36.4 C), Min:97.6 F (36.4 C), Max:97.6 F (36.4 C)  Recent Labs  Lab 09/03/17 1452  WBC 3.1*  CREATININE 2.32*    Estimated Creatinine Clearance: 19.9 mL/min (A) (by C-G formula based on SCr of 2.32 mg/dL (H)).    Allergies  Allergen Reactions  . Tequin [Gatifloxacin] Shortness Of Breath  . Bupropion     Other reaction(s): Other (See Comments) shaking  . Keflex [Cephalexin] Other (See Comments)    Unknown  . Lisinopril Other (See Comments)    Unknown  . Varenicline Nausea Only and Nausea And Vomiting    Antimicrobials this admission: aztreonam 7/5 vancomycin 7/5 >>  Zosyn 7/5 >>  Dose adjustments this admission:   Microbiology results: 7/5 BCx: sent  Thank you for allowing pharmacy to be a part of this patient's care.  Valentina GuChristy, Nawal Burling D 09/03/2017 6:46 PM

## 2017-09-03 NOTE — ED Notes (Signed)
Pt receiving 5L O2 at this time with sats above 90%

## 2017-09-04 DIAGNOSIS — E875 Hyperkalemia: Secondary | ICD-10-CM

## 2017-09-04 DIAGNOSIS — J9602 Acute respiratory failure with hypercapnia: Secondary | ICD-10-CM

## 2017-09-04 DIAGNOSIS — J9601 Acute respiratory failure with hypoxia: Secondary | ICD-10-CM

## 2017-09-04 DIAGNOSIS — K612 Anorectal abscess: Secondary | ICD-10-CM

## 2017-09-04 LAB — CBC
HCT: 25.7 % — ABNORMAL LOW (ref 35.0–47.0)
HEMATOCRIT: 27.3 % — AB (ref 35.0–47.0)
HEMOGLOBIN: 8.4 g/dL — AB (ref 12.0–16.0)
Hemoglobin: 8.5 g/dL — ABNORMAL LOW (ref 12.0–16.0)
MCH: 32 pg (ref 26.0–34.0)
MCH: 30.7 pg (ref 26.0–34.0)
MCHC: 31.2 g/dL — ABNORMAL LOW (ref 32.0–36.0)
MCHC: 32.6 g/dL (ref 32.0–36.0)
MCV: 98 fL (ref 80.0–100.0)
MCV: 98.5 fL (ref 80.0–100.0)
PLATELETS: 451 10*3/uL — AB (ref 150–440)
PLATELETS: 434 10*3/uL (ref 150–440)
RBC: 2.62 MIL/uL — AB (ref 3.80–5.20)
RBC: 2.78 MIL/uL — AB (ref 3.80–5.20)
RDW: 19.1 % — AB (ref 11.5–14.5)
RDW: 19.2 % — ABNORMAL HIGH (ref 11.5–14.5)
WBC: 5.3 10*3/uL (ref 3.6–11.0)
WBC: 3.3 10*3/uL — AB (ref 3.6–11.0)

## 2017-09-04 LAB — TROPONIN I
Troponin I: <0.03 ng/mL (ref <0.03)
Troponin I: <0.03 ng/mL (ref <0.03)

## 2017-09-04 LAB — BASIC METABOLIC PANEL
ANION GAP: 7 (ref 5–15)
BUN: 23 mg/dL (ref 8–23)
CALCIUM: 7.4 mg/dL — AB (ref 8.9–10.3)
CHLORIDE: 107 mmol/L (ref 98–111)
CO2: 27 mmol/L (ref 22–32)
Creatinine, Ser: 1.44 mg/dL — ABNORMAL HIGH (ref 0.44–1.00)
GFR calc non Af Amer: 34 mL/min — ABNORMAL LOW (ref >60)
GFR, EST AFRICAN AMERICAN: 40 mL/min — AB (ref >60)
Glucose, Bld: 85 mg/dL (ref 70–99)
Potassium: 5.3 mmol/L — ABNORMAL HIGH (ref 3.5–5.1)
Sodium: 141 mmol/L (ref 135–145)

## 2017-09-04 LAB — GLUCOSE, CAPILLARY
GLUCOSE-CAPILLARY: 61 mg/dL — AB (ref 70–99)
Glucose-Capillary: 153 mg/dL — ABNORMAL HIGH (ref 70–99)
Glucose-Capillary: 83 mg/dL (ref 70–99)
Glucose-Capillary: 78 mg/dL (ref 70–99)
Glucose-Capillary: 85 mg/dL (ref 70–99)

## 2017-09-04 LAB — PROCALCITONIN: Procalcitonin: <0.1 ng/mL

## 2017-09-04 LAB — AMMONIA: Ammonia: 13 umol/L (ref 9–35)

## 2017-09-04 LAB — LACTIC ACID, PLASMA: Lactic Acid, Venous: 0.6 mmol/L (ref 0.5–1.9)

## 2017-09-04 MED ORDER — FENTANYL CITRATE (PF) 100 MCG/2ML IJ SOLN
12.5000 ug | INTRAMUSCULAR | Status: DC | PRN
  Administered 2017-09-04 – 2017-09-05 (×3): 12.5 ug via INTRAVENOUS
  Filled 2017-09-04 (×3): qty 2

## 2017-09-04 MED ORDER — DEXTROSE 50 % IV SOLN
1.0000 | Freq: Once | INTRAVENOUS | Status: AC
  Administered 2017-09-04: 50 mL via INTRAVENOUS

## 2017-09-04 MED ORDER — PIPERACILLIN-TAZOBACTAM 3.375 G IVPB
3.3750 g | Freq: Three times a day (TID) | INTRAVENOUS | Status: DC
  Administered 2017-09-04 – 2017-09-06 (×6): 3.375 g via INTRAVENOUS
  Filled 2017-09-04 (×6): qty 50

## 2017-09-04 MED ORDER — DEXTROSE 50 % IV SOLN
INTRAVENOUS | Status: AC
  Administered 2017-09-04: 50 mL via INTRAVENOUS
  Filled 2017-09-04: qty 50

## 2017-09-04 MED ORDER — DEXTROSE-NACL 5-0.45 % IV SOLN
INTRAVENOUS | Status: DC
  Administered 2017-09-04: 17:00:00 via INTRAVENOUS

## 2017-09-04 MED ORDER — VANCOMYCIN HCL IN DEXTROSE 1-5 GM/200ML-% IV SOLN
1000.0000 mg | INTRAVENOUS | Status: DC
  Administered 2017-09-04: 1000 mg via INTRAVENOUS
  Filled 2017-09-04 (×2): qty 200

## 2017-09-04 NOTE — Progress Notes (Signed)
Resting at this time.  o2 sats back up to mid 90's on 4 L nasal cannula.  Alert to self and place.  Daughter visited most of shift.

## 2017-09-04 NOTE — Consult Note (Signed)
SURGICAL CONSULTATION NOTE (initial) - cpt: [id number]: [id number]  HISTORY OF PRESENT ILLNESS (HPI):  77 y.o. female on home supplemental oxygen and known to our service s/p incision and drainage of horseshoe peri-anorectal abscess (Pabon, 08/20/2017) and discharged to rehab 6/27 with surgical site Penrose drains secured returned [facility] ED yesterday for COPD exacerbation with worsened confusion and weakness x "several days". Patient is scheduled for outpatient surgical follow-up this coming Monday, 7/8. Due to patient's readmission, she is anticipated to miss her upcoming surgical follow-up, and surgical wound assessment is requested accordingly. Patient and her family report minimal drainage from her peri-anorectal wound and describe that "it looks good". Patient does not report any pain.  Surgery is consulted by medical physician Dr. Elisabeth PigeonVachhani in this context for evaluation and management of post-surgical peri-anorectal wound.  PAST MEDICAL HISTORY (PMH):  Past Medical History:  Diagnosis Date  . Arthritis    all over  . Back pain   . COPD (chronic obstructive pulmonary disease) (HCC)    O2 use continuosly at 2 liters/ some  asthma symptoms  . Diabetes mellitus without complication (HCC)    diet controlled  . GERD (gastroesophageal reflux disease)   . Heart murmur    lifelong  . Hypercholesterolemia   . Hypertension    controlled on meds  . Pneumonia 2015   [facility] hospitalized  . Renal insufficiency    early stage kidney failure  . Shortness of breath dyspnea   . Sleep apnea    2nd sleep study said pt does not have sleep apnea  . Swelling    of feet and legs     PAST SURGICAL HISTORY (PSH):  Past Surgical History:  Procedure Laterality Date  . ABDOMINAL HYSTERECTOMY  1988  . BACK SURGERY  1978 and 1995   residual nerve damage legs-weakness  . BLADDER SURGERY  2017  . CATARACT EXTRACTION Right   . CATARACT EXTRACTION W/PHACO Left 09/05/2014   Procedure: CATARACT EXTRACTION PHACO AND  INTRAOCULAR LENS PLACEMENT (IOC);  Surgeon: Lockie Molahadwick Brasington, MD;  Location: [facility];  Service: Ophthalmology;  Laterality: Left;  DIABETIC  . CHOLECYSTECTOMY  2011  . INCISION AND DRAINAGE PERIRECTAL ABSCESS N/A 08/20/2017   Procedure: IRRIGATION AND DEBRIDEMENT PERIRECTAL ABSCESS;  Surgeon: Leafy RoPabon, Diego F, MD;  Location: [facility] ORS;  Service: General;  Laterality: N/A;  . JOINT REPLACEMENT Right 2009  . RECTAL EXAM UNDER ANESTHESIA N/A 08/20/2017   Procedure: RECTAL EXAM UNDER ANESTHESIA;  Surgeon: Leafy RoPabon, Diego F, MD;  Location: [facility] ORS;  Service: General;  Laterality: N/A;  . REPLACEMENT TOTAL KNEE Right   . REVERSE SHOULDER ARTHROPLASTY Right 01/08/2017   Procedure: REVERSE SHOULDER ARTHROPLASTY;  Surgeon: Signa KellPatel, Sunny, MD;  Location: [facility] ORS;  Service: Orthopedics;  Laterality: Right;     MEDICATIONS:  Prior to Admission medications   Medication Sig Start Date End Date Taking? Authorizing Provider  albuterol (PROVENTIL HFA;VENTOLIN HFA) 108 (90 Base) MCG/ACT inhaler Inhale 2 puffs into the lungs every 6 (six) hours as needed for wheezing or shortness of breath.   Yes [provider]  allopurinol (ZYLOPRIM) 100 MG tablet Take 200 mg by mouth daily.    Yes [provider]  aspirin EC 81 MG tablet Take 81 mg by mouth daily.    Yes [provider]  bisacodyl (DULCOLAX) 5 MG EC tablet Take 2 tablets (10 mg total) by mouth daily. 08/27/17  Yes Shaune Pollackhen, Qing, MD  calcium carbonate (TUMS - DOSED IN MG ELEMENTAL CALCIUM) 500 MG chewable tablet  Chew 2 tablets (1,000 mg total) by mouth 3 (three) times daily with meals. 08/26/17  Yes Shaune Pollack, MD  diclofenac (FLECTOR) 1.3 % PTCH Place 1 patch onto the skin daily as needed (for pain).   Yes [provider]  docusate sodium (COLACE) 100 MG capsule Take 1 capsule (100 mg total) by mouth 2 (two) times daily. 08/26/17  Yes Shaune Pollack, MD  ezetimibe (ZETIA) 10 MG tablet Take 10 mg by mouth at bedtime.   Yes  [provider]  felodipine (PLENDIL) 10 MG 24 hr tablet Take 10 mg by mouth at bedtime.   Yes [provider]  fluticasone (FLONASE) 50 MCG/ACT nasal spray Place 2 sprays into both nostrils daily as needed for allergies. 12/08/16  Yes [provider]  fluticasone-salmeterol (ADVAIR HFA) 115-21 MCG/ACT inhaler Inhale 2 puffs into the lungs 2 (two) times daily.    Yes [provider]  folic acid (FOLVITE) 1 MG tablet Take 1 tablet (1 mg total) by mouth daily. 08/27/17  Yes Shaune Pollack, MD  gabapentin (NEURONTIN) 400 MG capsule Take 1 capsule (400 mg total) by mouth 3 (three) times daily. 08/26/17  Yes Shaune Pollack, MD  guaiFENesin (MUCINEX) 600 MG 12 hr tablet Take 600 mg by mouth every 12 (twelve) hours as needed for cough.   Yes [provider]  LORazepam (ATIVAN) 0.5 MG tablet Take 0.5 mg by mouth every 4 (four) hours as needed for anxiety.   Yes [provider]  metoprolol succinate (TOPROL-XL) 100 MG 24 hr tablet Take 100 mg by mouth daily.    Yes [provider]  nicotine (NICODERM CQ - DOSED IN MG/24 HOURS) 14 mg/24hr patch APP 1 PA EXT TO THE SKIN D 07/01/17  Yes [provider]  omeprazole (PRILOSEC) 40 MG capsule Take 40 mg by mouth daily.    Yes [provider]  oxyCODONE (ROXICODONE) 15 MG immediate release tablet Take 1 tablet (15 mg total) by mouth every 6 (six) hours. 08/26/17  Yes Shaune Pollack, MD  pramipexole (MIRAPEX) 0.5 MG tablet Take 1 mg by mouth at bedtime.    Yes [provider]  sulfamethoxazole-trimethoprim (BACTRIM DS,SEPTRA DS) 800-160 MG tablet Take 1 tablet by mouth every 12 (twelve) hours. 08/26/17  Yes Shaune Pollack, MD  theophylline (UNIPHYL) 400 MG 24 hr tablet Take 1 tablet by mouth daily.  08/17/17  Yes [provider]  tiotropium (SPIRIVA) 18 MCG inhalation capsule Place 18 mcg into inhaler and inhale daily.    Yes [provider]  Vitamin D, Ergocalciferol, (DRISDOL) 50000  UNITS CAPS capsule Take 50,000 Units by mouth every 14 (fourteen) days.   Yes [provider]     ALLERGIES:  Allergies  Allergen Reactions  . Tequin [Gatifloxacin] Shortness Of Breath  . Bupropion     Other reaction(s): Other (See Comments) shaking  . Keflex [Cephalexin] Other (See Comments)    Unknown  . Lisinopril Other (See Comments)    Unknown  . Varenicline Nausea Only and Nausea And Vomiting     SOCIAL HISTORY:  Social History   Socioeconomic History  . Marital status: Married    Spouse name: Not on file  . Number of children: Not on file  . Years of education: Not on file  . Highest education level: Not on file  Occupational History  . Not on file  Social Needs  . Financial resource strain: Not on file  . Food insecurity:    Worry: Not on file  Inability: Not on file  . Transportation needs:    Medical: Not on file    Non-medical: Not on file  Tobacco Use  . Smoking status: Current Every Day Smoker    Packs/day: 0.25    Years: 50.00    Pack years: 12.50    Types: Cigarettes  . Smokeless tobacco: Never Used  Substance and Sexual Activity  . Alcohol use: Yes    Alcohol/week: 1.8 oz    Types: 3 Glasses of wine per week    Comment: rarely  . Drug use: No  . Sexual activity: Not on file  Lifestyle  . Physical activity:    Days per week: Not on file    Minutes per session: Not on file  . Stress: Not on file  Relationships  . Social connections:    Talks on phone: Not on file    Gets together: Not on file    Attends religious service: Not on file    Active member of club or organization: Not on file    Attends meetings of clubs or organizations: Not on file    Relationship status: Not on file  . Intimate partner violence:    Fear of current or ex partner: Not on file    Emotionally abused: Not on file    Physically abused: Not on file    Forced sexual activity: Not on file  Other Topics Concern  . Not on file  Social History Narrative   . Not on file     FAMILY HISTORY:  Family History  Problem Relation Age of Onset  . Breast cancer Mother 92  . Breast cancer Paternal Grandmother 44     REVIEW OF SYSTEMS: Limited to HPI due to altered mental status  VITAL SIGNS:  Temp:  [94.1 F (34.5 C)-99 F (37.2 C)] 98.4 F (36.9 C) (07/06 1130) Pulse Rate:  [59-91] 73 (07/06 1130) Resp:  [11-25] 20 (07/06 1130) BP: (72-136)/(30-86) 107/57 (07/06 1130) SpO2:  [83 %-99 %] 93 % (07/06 1130) FiO2 (%):  [60 %] 60 % (07/06 0215) Weight:  [183 lb 13.8 oz (83.4 kg)-187 lb (84.8 kg)] 183 lb 13.8 oz (83.4 kg) (07/05 1900)     Height: 5\' 2"  (157.5 cm) Weight: 183 lb 13.8 oz (83.4 kg) BMI (Calculated): 33.62   INTAKE/OUTPUT:  This shift: Total I/O In: 424.8 [I.V.:424.8] Out: 125 [Urine:125]  Last 2 shifts: @IOLAST2SHIFTS @   PHYSICAL EXAM:  Constitutional:  -- Overweight body habitus  -- Awake and alert with no apparent distress Eyes:  -- Pupils equally round and reactive to light  -- No scleral icterus, B/L no occular discharge Ear, nose, throat: -- Neck is FROM WNL -- No jugular venous distension  Pulmonary:  -- Equal breath sounds bilaterally -- Breathing non-labored at rest on supplemental oxygen via [place] Cardiovascular:  -- S1, S2 present  -- No pericardial rubs  Gastrointestinal:  -- Abdomen soft, nontender, non-distended, no guarding or rebound tenderness -- No abdominal masses appreciated, pulsatile or otherwise  Musculoskeletal and Integumentary:  -- Wounds or skin discoloration: two peri-anorectal incisions, both with well-secured Penrose drains and no surrounding erythema or fluctuance, a small amount of tan somewhat purulent-appearing fluid, and adjacent rectal temperature probe -- Extremities: B/L UE and LE FROM, hands and feet warm, no edema  Neurologic:  -- Motor function: Intact and symmetric -- Sensation: Intact and symmetric Psychiatric:  -- Mood and affect unable to be assessed  Labs:  CBC Latest  Ref Rng & Units 09/04/2017  09/03/2017 09/03/2017  WBC 3.6 - 11.0 K/uL 5.3 3.3(L) 3.1(L)  Hemoglobin 12.0 - 16.0 g/dL 6.3(O) 7.5(I) 8.1(L)  Hematocrit 35.0 - 47.0 % 25.7(L) 27.3(L) 26.0(L)  Platelets 150 - 440 K/uL 451(H) 434 378   CMP Latest Ref Rng & Units 09/04/2017 09/03/2017 09/03/2017  Glucose 70 - 99 mg/dL 85 79 89  BUN 8 - 23 mg/dL 23 43(P) 29(J)  Creatinine 0.44 - 1.00 mg/dL 1.88(C) 1.66(A) 6.30(Z)  Sodium 135 - 145 mmol/L 141 141 138  Potassium 3.5 - 5.1 mmol/L 5.3(H) 6.8(HH) 7.2(HH)  Chloride 98 - 111 mmol/L 107 108 108  CO2 22 - 32 mmol/L 27 24 23   Calcium 8.9 - 10.3 mg/dL 7.4(L) 7.9(L) 7.5(L)  Total Protein 6.5 - 8.1 g/dL - - 6.2(L)  Total Bilirubin 0.3 - 1.2 mg/dL - - 0.7  Alkaline Phos 38 - 126 U/L - - 84  AST 15 - 41 U/L - - 24  ALT 0 - 44 U/L - - 13   Imaging studies:  Chest X-ray (09/03/2017) Chest x-ray suggests pulmonary edema and small  left pleural effusion. Infection cannot be excluded.  Assessment/Plan: (ICD-10's: K32.2) 77 y.o. female with increased confusion and weakness associated with COPD exacerbation and possible pneumonia while at rehab 2 weeks s/p incision and drainage of horseshoe peri-anorectal abscess, complicated by AKI s/p emergent HD this admission and by pertinent comorbidities including DM, HTN, hypercholesterolemia, CKD, COPD on home supplemental oxygen, OSA, peripheral edema, GERD, osteoarthritis, chronic back pain, and chronic ongoing tobacco abuse (smoking).   - continue antibiotics   - no indication for further surgical intervention at this time   - call office when ready for discharge to schedule outpatient follow-up  - medical management of acute and medical comorbidities   - will signoff, please call if any questions or concerns  - DVT prophylaxis  All of the above findings and recommendations were discussed with the patient's family and patient's RN, and all of patient's family's questions were answered to their expressed satisfaction.  Thank  you for the opportunity to participate in this patient's care.   -- Scherrie Gerlach Earlene Plater, MD, RPVI [place]: [facility] - Partnering for exceptional care. Office: [phone]

## 2017-09-04 NOTE — Procedures (Signed)
Insertion of a Dialysis Catheter Procedure Note Brenda Glenn [id number] 11-18-1940  Procedure: Insertion of Central Venous Catheter Indications: emergent HD  Procedure Details Consent: Unable to obtain consent because of emergent medical necessity. Time Out: Verified patient identification, verified procedure, site/side was marked, verified correct patient position, special equipment/implants available, medications/allergies/relevent history reviewed, required imaging and test results available.  Performed  Maximum sterile technique was used including antiseptics, cap, gloves, gown, hand hygiene, mask and sheet. Skin prep: Chlorhexidine; local anesthetic administered A antimicrobial bonded/coated triple lumen catheter was placed in the right femoral vein due to emergent situation using the Seldinger technique.  Evaluation Blood flow good Complications: No apparent complications Patient did tolerate procedure well. Chest X-ray ordered to verify placement.  CXR: Not indicated in femoral line   Procedure performed under direct supervision of Dr.Kasa. Ultrasound utilized for realtime vessel cannulation.  Brenda Glenn Brenda Glenn ANP-BC Pulmonary and Critical Care Medicine [facility] Pager [phone] or [phone]  NB: This document was prepared using Dragon voice recognition software and may include unintentional dictation errors.   09/04/2017, 6:50 AM

## 2017-09-04 NOTE — Progress Notes (Signed)
This note also relates to the following rows which could not be included: Temp - Cannot attach notes to unvalidated device data Pulse Rate - Cannot attach notes to unvalidated device data Resp - Cannot attach notes to unvalidated device data BP - Cannot attach notes to unvalidated device data  Hd comleted

## 2017-09-04 NOTE — Progress Notes (Signed)
Blood glucose this morning was 61 therefore amp of d50 was given.  CBG now 153 on recheck.  Patient continues to be confused and drowsy.

## 2017-09-04 NOTE — Progress Notes (Signed)
[facility] - [place] at [facility]   PATIENT NAME: Brenda Glenn    MR#:  [id number]  DATE OF BIRTH:  [birth year]  SUBJECTIVE:   Came In after patient was found lethargic deconditioned and poor PO intake from the facility. Husband in the room. Patient is off BiPAP. She had urgent hemodialysis due to elevated potassium REVIEW OF SYSTEMS:   Review of Systems  Unable to perform ROS: Acuity of condition   Tolerating Diet: Tolerating PT:   DRUG ALLERGIES:   Allergies  Allergen Reactions  . Tequin [Gatifloxacin] Shortness Of Breath  . Bupropion     Other reaction(s): Other (See Comments) shaking  . Keflex [Cephalexin] Other (See Comments)    Unknown  . Lisinopril Other (See Comments)    Unknown  . Varenicline Nausea Only and Nausea And Vomiting    VITALS:  Blood pressure (!) 106/45, pulse 73, temperature 98.4 F (36.9 C), resp. rate 15, height 5\' 2"  (1.575 m), weight 83.4 kg (183 lb 13.8 oz), SpO2 92 %.  PHYSICAL EXAMINATION:   Physical Exam  GENERAL:  77 y.o.-year-old patient lying in the bed with no acute distress. Obese EYES: Pupils equal, round, reactive to light and accommodation. No scleral icterus. Extraocular muscles intact.  HEENT: Head atraumatic, normocephalic. Oropharynx and nasopharynx clear.  NECK:  Supple, no jugular venous distention. No thyroid enlargement, no tenderness.  LUNGS: Normal breath sounds bilaterally, no wheezing, rales, rhonchi. No use of accessory muscles of respiration.  CARDIOVASCULAR: S1, S2 normal. No murmurs, rubs, or gallops.  ABDOMEN: Soft, nontender, nondistended. Bowel sounds present. No organomegaly or mass.  EXTREMITIES: No cyanosis, clubbing or edema b/l.    NEUROLOGIC: moves all extremities well PSYCHIATRIC:  patient is very sleepy SKIN: No obvious rash, lesion, or ulcer. Drain+ on the buttock  LABORATORY PANEL:  CBC Recent Labs  Lab 09/04/17 0525  WBC 5.3  HGB 8.4*  HCT 25.7*  PLT 451*     Chemistries  Recent Labs  Lab 09/03/17 1452 09/03/17 1958 09/04/17 0525  NA 138 141 141  K 7.2* 6.8* 5.3*  CL 108 108 107  CO2 23 24 27   GLUCOSE 89 79 85  BUN 37* 38* 23  CREATININE 2.32* 2.35* 1.44*  CALCIUM 7.5* 7.9* 7.4*  MG  --  2.4  --   AST 24  --   --   ALT 13  --   --   ALKPHOS 84  --   --   BILITOT 0.7  --   --    Cardiac Enzymes Recent Labs  Lab 09/04/17 0525  TROPONINI <0.03   RADIOLOGY:  Dg Chest Port 1 View  Result Date: 09/03/2017 CLINICAL DATA:  77 year old female with a history of poor oxygenation EXAM: PORTABLE CHEST 1 VIEW COMPARISON:  08/15/2017, CT 08/15/2017 FINDINGS: Cardiomediastinal silhouette unchanged in size and contour. Mixed interstitial and airspace opacities the bilateral lungs, increased from the comparison. Blunting of the left costophrenic angle with retrocardiac opacity. Interlobular septal thickening. Interval removal of left IJ central catheter. Surgical changes of the right glenohumeral joint. IMPRESSION: Chest x-ray suggests pulmonary edema and small left pleural effusion. Infection cannot be excluded. Interval removal of left IJ central venous catheter. Electronically Signed   By: Gilmer MorJaime  Wagner D.O.   On: 09/03/2017 14:33   ASSESSMENT AND PLAN:   Brenda LewJamie Kotlarz  is a 77 y.o. female with a known history of arthritis, COPD with chronic oxygen use at 2 L, diabetes, gastroesophageal reflux disease, heart murmur, hypercholesterolemia, hypertension,  pneumonia, renal insufficiency-was admitted to hospital last week with perirectal  abscess-I&D was done, was discharged with oral Bactrim 1 week ago.  She was sent to rehab center because of her declining functional status. At the rehab for last 1 week she has continued to progressively get worse to the point now staying very lethargic, not eating much, not having much urination.  *Acute hypoxic respiratory failure suspected pulmonary edema versus CHF diastolic -empiric  on broad-spectrum IV  antibiotics. -X-ray negative for pneumonia. No fever.  -patient now off BiPAP. Sats maintaining 92 to 94 on 2-3 L nasal cannula oxygen -wean as tolerated. -IV Lasix if needed -Procalcitonin negative, lactic acid neg--de-escalate abxs  * Ac on ch respi failure    Continue BiPAP, monitor in ICU.    Continue nebulizer therapy.  *Acute renal failure with severe hyperkalemia respect ATN with hypertension, poor PO intake, recent Bactrim -now improved with urgent hemodialysis -even with potassium of 7.2---6.8---5.3 -creatinine 2.32---2.35---1.44 -baseline creatinine 1.5 to 1.8 -received nephrology input -patient received  calcium gluconate, insulin plus dextrose, V[place]-- in the ER  * COPD   Cont theophylline, Nebs.  *Anemia Continue to monitor, if drops we may need to have blood transfusion.  *DM-2 SSI for now  D/w husband  Case discussed with Care Management/Social Worker. Management plans discussed with the patient, family and they are in agreement.  CODE STATUS: DNR  DVT Prophylaxis: heparin  TOTAL TIME TAKING CARE OF THIS PATIENT: 30** minutes.  >50% time spent on counselling and coordination of care  POSSIBLE D/C IN few DAYS, DEPENDING ON CLINICAL CONDITION.  Note: This dictation was prepared with Dragon dictation along with smaller phrase technology. Any transcriptional errors that result from this process are unintentional.  Enedina Finner M.D on 09/04/2017 at 11:27 AM  Between 7am to 6pm - Pager - [phone]  After 6pm go to [email or web address] - Social research officer, government  [facility]  Office  [phone]  CC: Primary care physician; Mickey Farber, MDPatient ID: [name] No, female   DOB: [birth year], 77 y.o.   MRN: [id number]

## 2017-09-04 NOTE — Progress Notes (Signed)
Pharmacy Antibiotic Note  [name] NoJamie Graves Coatney is a 77 y.o. female admitted on 09/03/2017 with perirectal abscess.  Pharmacy has been consulted for vancomycin and Zosyn dosing.  Plan: Zosyn 3.375 g EI q 8 hours.   Vancomycin 1 g IV every 24 hours. Follow renal function closely and adjust dosing/check levels as clinically indicated.   Height: 5\' 2"  (157.5 cm) Weight: 183 lb 13.8 oz (83.4 kg) IBW/kg (Calculated) : 50.1  Temp (24hrs), Avg:97.5 F (36.4 C), Min:94.1 F (34.5 C), Max:99 F (37.2 C)  Recent Labs  Lab 09/03/17 1452 09/03/17 1958 09/03/17 2345 09/03/17 2346 09/04/17 0525  WBC 3.1*  --   --  3.3* 5.3  CREATININE 2.32* 2.35*  --   --  1.44*  LATICACIDVEN  --  1.0 0.6  --   --     Estimated Creatinine Clearance: 33.3 mL/min (A) (by C-G formula based on SCr of 1.44 mg/dL (H)).    Allergies  Allergen Reactions  . Tequin [Gatifloxacin] Shortness Of Breath  . Bupropion     Other reaction(s): Other (See Comments) shaking  . Keflex [Cephalexin] Other (See Comments)    Unknown  . Lisinopril Other (See Comments)    Unknown  . Varenicline Nausea Only and Nausea And Vomiting    Antimicrobials this admission: aztreonam 7/5 vancomycin 7/5 >>  Zosyn 7/5 >>  Dose adjustments this admission: 7/6  Vancomycin 750mg  Q36H  >> 1g Q24H 7/6  Zosyn 3.375g Q12H >> Q8H  Microbiology results: 7/5 BCx: sent  Thank you for allowing pharmacy to be a part of this patient's care.  Stormy CardKatsoudas,Emerick Weatherly K, [facility] LPRPH 09/04/2017 2:32 PM

## 2017-09-04 NOTE — Progress Notes (Signed)
[facility] [place], [place] 09/04/17  Subjective:   Patient underwent emergent HD last night Potassium now upper limit of normal S Creatinine is lower Patient is still very drowsy.  She is off BiPAP right now   Objective:  Vital signs in last 24 hours:  Temp:  [94.1 F (34.5 C)-99 F (37.2 C)] 98.6 F (37 C) (07/06 0800) Pulse Rate:  [59-91] 76 (07/06 0800) Resp:  [11-25] 19 (07/06 0800) BP: (72-136)/(30-86) 113/58 (07/06 0800) SpO2:  [83 %-99 %] 91 % (07/06 0800) FiO2 (%):  [60 %] 60 % (07/06 0215) Weight:  [83.4 kg (183 lb 13.8 oz)-84.8 kg (187 lb)] 83.4 kg (183 lb 13.8 oz) (07/05 1900)  Weight change:  Filed Weights   09/03/17 1306 09/03/17 1900  Weight: 84.8 kg (187 lb) 83.4 kg (183 lb 13.8 oz)    Intake/Output:    Intake/Output Summary (Last 24 hours) at 09/04/2017 [id number] Last data filed at 09/04/2017 0800 Gross per 24 hour  Intake 1697.31 ml  Output 710 ml  Net 987.31 ml     Physical Exam: General:  No acute distress, laying in the bed  HEENT  oral mucous membranes appear dry, anicteric  Neck  supple, no JVD  Pulm/lungs  shallow breathing effort, no crackles, decreased breath sounds at bases  CVS/Heart  irregular rhythm, no rub or gallop  Abdomen:   Soft, nontender  Extremities:  1-2+ pitting edema in the arms and thighs, right greater than left  Neurologic:  Drowsy,confused, mumbled speech  Skin:  warm, dry, no acute rashes  Access:  femoral dialysis cathter    Foley in place    Basic Metabolic Panel:  Recent Labs  Lab 09/03/17 1452 09/03/17 1958 09/04/17 0525  NA 138 141 141  K 7.2* 6.8* 5.3*  CL 108 108 107  CO2 23 24 27   GLUCOSE 89 79 85  BUN 37* 38* 23  CREATININE 2.32* 2.35* 1.44*  CALCIUM 7.5* 7.9* 7.4*  MG  --  2.4  --   PHOS  --  4.6  --      CBC: Recent Labs  Lab 09/03/17 1452 09/03/17 2346 09/04/17 0525  WBC 3.1* 3.3* 5.3  NEUTROABS 2.5  --   --   HGB 8.1* 8.5* 8.4*  HCT 26.0* 27.3* 25.7*  MCV 99.3  98.5 98.0  PLT 378 434 451*     No results found for: HEPBSAG, HEPBSAB, HEPBIGM    Microbiology:  Recent Results (from the past 240 hour(s))  Blood Culture (routine x 2)     Status: None (Preliminary result)   Collection Time: 09/03/17  2:11 PM  Result Value Ref Range Status   Specimen Description BLOOD LEFT ARM  Final   Special Requests   Final    BOTTLES DRAWN AEROBIC AND ANAEROBIC Blood Culture adequate volume   Culture   Final    NO GROWTH < 24 HOURS Performed at [facility], [address] [place], [place] [address]    Report Status PENDING  Incomplete  Blood Culture (routine x 2)     Status: None (Preliminary result)   Collection Time: 09/03/17  7:58 PM  Result Value Ref Range Status   Specimen Description BLOOD RIGHT WRIST  Final   Special Requests   Final    BOTTLES DRAWN AEROBIC AND ANAEROBIC Blood Culture results may not be optimal due to an inadequate volume of blood received in culture bottles   Culture   Final    NO GROWTH <  12 HOURS Performed at [facility], [address] [place], [place] [address]    Report Status PENDING  Incomplete    Coagulation Studies: Recent Labs    09/03/17 1452  LABPROT 13.4  INR 1.03    Urinalysis: Recent Labs    09/03/17 1324  COLORURINE AMBER*  LABSPEC 1.018  PHURINE 5.0  GLUCOSEU NEGATIVE  HGBUR NEGATIVE  BILIRUBINUR NEGATIVE  KETONESUR NEGATIVE  PROTEINUR 100*  NITRITE NEGATIVE  LEUKOCYTESUR NEGATIVE      Imaging: Dg Chest Port 1 View  Result Date: 09/03/2017 CLINICAL DATA:  77 year old female with a history of poor oxygenation EXAM: PORTABLE CHEST 1 VIEW COMPARISON:  08/15/2017, CT 08/15/2017 FINDINGS: Cardiomediastinal silhouette unchanged in size and contour. Mixed interstitial and airspace opacities the bilateral lungs, increased from the comparison. Blunting of the left costophrenic angle with retrocardiac opacity. Interlobular septal thickening. Interval removal of left IJ central  catheter. Surgical changes of the right glenohumeral joint. IMPRESSION: Chest x-ray suggests pulmonary edema and small left pleural effusion. Infection cannot be excluded. Interval removal of left IJ central venous catheter. Electronically Signed   By: Gilmer Mor D.O.   On: 09/03/2017 14:33     Medications:   . sodium chloride 75 mL/hr at 09/03/17 2300  . calcium gluconate 1 GM IVPB    . norepinephrine (LEVOPHED) Adult infusion 2 mcg/min (09/04/17 0827)  . piperacillin-tazobactam (ZOSYN)  IV 3.375 g (09/04/17 0650)  . vancomycin Stopped (09/03/17 2321)   . aspirin EC  81 mg Oral Daily  . bisacodyl  10 mg Oral Daily  . calcium carbonate  1,000 mg Oral TID WC  . chlorhexidine  15 mL Mouth Rinse BID  . Chlorhexidine Gluconate Cloth  6 each Topical Q0600  . docusate sodium  100 mg Oral BID  . folic acid  1 mg Oral Daily  . gabapentin  400 mg Oral TID  . heparin  5,000 Units Subcutaneous Q8H  . mouth rinse  15 mL Mouth Rinse q12n4p  . mometasone-formoterol  2 puff Inhalation BID  . nicotine  21 mg Transdermal Daily  . oxyCODONE  15 mg Oral Q6H  . pantoprazole  40 mg Oral Daily  . patiromer  8.4 g Oral Daily  . theophylline  400 mg Oral Daily  . tiotropium  18 mcg Inhalation Daily   docusate sodium, fentaNYL (SUBLIMAZE) injection, fluticasone, guaiFENesin, LORazepam  Assessment/ Plan:  77 y.o. female with medical problems of COPD, diabetes, GERD, who was admitted to [facility] on 09/03/2017 for evaluation of lethargy, decreased appetite, acute confusion.   1. Acute renal failure 2. Hyperkalemia 3. Acute pulmonary edema 4. Generalized edema 5. Urinary retention, likely from narcotics 5. Altered mental status  Likely multifactorial with contribution from hypotension, Bactrim,  Decreased level of consciousness likely from gabapentin  Recommend to hold bactrim, Gabapentin, Avoid NSAIDs, iv contrast Potassium level has been contacted and patient is off of BiPAP No acute indication  for dialysis at present Continue to monitor closely Gabapentin discontinued     LOS: 1 Verdis Koval 7/6/20199:22 AM  [facility] [place], [place] [phone]  Note: This note was prepared with Dragon dictation. Any transcription errors are unintentional

## 2017-09-04 NOTE — Progress Notes (Signed)
RN made Dr. Belia HemanKasa aware that patient is tolerating sips of water and stating she is hungry.  Patient is alert to self and place.  MD gave order for clear liquid diet.

## 2017-09-04 NOTE — Progress Notes (Signed)
RN called and spoke with Dr. Belia HemanKasa and made MD aware that patient started sounding like her voice was gurgled and dropped her o2 sats while taking clear liquids for dinner.  MD gave order for NPO, ST eval and to change IVF to d5 0.45% saline at 50 cc/H.

## 2017-09-05 LAB — PHOSPHORUS: Phosphorus: 2.1 mg/dL — ABNORMAL LOW (ref 2.5–4.6)

## 2017-09-05 LAB — BASIC METABOLIC PANEL
Anion gap: 2 — ABNORMAL LOW (ref 5–15)
BUN: 17 mg/dL (ref 8–23)
CO2: 28 mmol/L (ref 22–32)
Calcium: 6.8 mg/dL — ABNORMAL LOW (ref 8.9–10.3)
Chloride: 110 mmol/L (ref 98–111)
Creatinine, Ser: 1.15 mg/dL — ABNORMAL HIGH (ref 0.44–1.00)
GFR calc Af Amer: 52 mL/min — ABNORMAL LOW (ref >60)
GFR calc non Af Amer: 45 mL/min — ABNORMAL LOW (ref >60)
GLUCOSE: 91 mg/dL (ref 70–99)
POTASSIUM: 5.1 mmol/L (ref 3.5–5.1)
SODIUM: 140 mmol/L (ref 135–145)

## 2017-09-05 LAB — GLUCOSE, CAPILLARY
GLUCOSE-CAPILLARY: 75 mg/dL (ref 70–99)
GLUCOSE-CAPILLARY: 83 mg/dL (ref 70–99)
GLUCOSE-CAPILLARY: 87 mg/dL (ref 70–99)
Glucose-Capillary: 99 mg/dL (ref 70–99)
Glucose-Capillary: 90 mg/dL (ref 70–99)
Glucose-Capillary: 82 mg/dL (ref 70–99)
Glucose-Capillary: 83 mg/dL (ref 70–99)

## 2017-09-05 LAB — CBC
HCT: 24.5 % — ABNORMAL LOW (ref 35.0–47.0)
HEMOGLOBIN: 7.7 g/dL — AB (ref 12.0–16.0)
MCH: 31.1 pg (ref 26.0–34.0)
MCHC: 31.6 g/dL — ABNORMAL LOW (ref 32.0–36.0)
MCV: 98.6 fL (ref 80.0–100.0)
Platelets: 332 10*3/uL (ref 150–440)
RBC: 2.48 MIL/uL — AB (ref 3.80–5.20)
RDW: 19.2 % — ABNORMAL HIGH (ref 11.5–14.5)
WBC: 4.1 10*3/uL (ref 3.6–11.0)

## 2017-09-05 LAB — PROCALCITONIN

## 2017-09-05 LAB — MAGNESIUM: MAGNESIUM: 2 mg/dL (ref 1.7–2.4)

## 2017-09-05 LAB — HEPATITIS B SURFACE ANTIGEN: Hepatitis B Surface Ag: NEGATIVE

## 2017-09-05 LAB — HEPATITIS B SURFACE ANTIBODY,QUALITATIVE: Hep B S Ab: NONREACTIVE

## 2017-09-05 LAB — HEPATITIS B CORE ANTIBODY, TOTAL: Hep B Core Total Ab: NEGATIVE

## 2017-09-05 MED ORDER — LORAZEPAM 2 MG/ML IJ SOLN
1.0000 mg | Freq: Three times a day (TID) | INTRAMUSCULAR | Status: DC | PRN
  Administered 2017-09-05: 1 mg via INTRAVENOUS
  Filled 2017-09-05: qty 1

## 2017-09-05 MED ORDER — LORAZEPAM BOLUS VIA INFUSION: 1.0000 mg | Freq: Three times a day (TID) | INTRAVENOUS | Status: DC | PRN

## 2017-09-05 NOTE — Progress Notes (Signed)
[facility] [place] [facility]   PATIENT NAME: Doreatha LewJamie Moor    MR#:  [id number]  DATE OF BIRTH:  January 26, 1941  SUBJECTIVE:   Came In after patient was found lethargic deconditioned and poor PO intake from the facility. dter in the room. Patient is off BiPAP. She had urgent hemodialysis due to elevated potassium REVIEW OF SYSTEMS:   Review of Systems  Unable to perform ROS: Acuity of condition   Tolerating Diet:pending speech eval Tolerating PT: rehab  DRUG ALLERGIES:   Allergies  Allergen Reactions  . Tequin [Gatifloxacin] Shortness Of Breath  . Bupropion     Other reaction(s): Other (See Comments) shaking  . Keflex [Cephalexin] Other (See Comments)    Unknown  . Lisinopril Other (See Comments)    Unknown  . Varenicline Nausea Only and Nausea And Vomiting    VITALS:  Blood pressure 119/64, pulse 83, temperature 97.9 F (36.6 C), resp. rate 13, height 5\' 2"  (1.575 m), weight 83.4 kg (183 lb 13.8 oz), SpO2 92 %.  PHYSICAL EXAMINATION:   Physical Exam  GENERAL:  77 y.o.-year-old patient lying in the bed with no acute distress. Obese EYES: Pupils equal, round, reactive to light and accommodation. No scleral icterus. Extraocular muscles intact.  HEENT: Head atraumatic, normocephalic. Oropharynx and nasopharynx clear.  NECK:  Supple, no jugular venous distention. No thyroid enlargement, no tenderness.  LUNGS: Normal breath sounds bilaterally, no wheezing, rales, rhonchi. No use of accessory muscles of respiration.  CARDIOVASCULAR: S1, S2 normal. No murmurs, rubs, or gallops.  ABDOMEN: Soft, nontender, nondistended. Bowel sounds present. No organomegaly or mass.  EXTREMITIES: No cyanosis, clubbing or edema b/l.    NEUROLOGIC: moves all extremities well PSYCHIATRIC:  patient is very awake and alert SKIN: No obvious rash, lesion, or ulcer. Drain+ on the buttock  LABORATORY PANEL:  CBC Recent Labs  Lab 09/05/17 0639  WBC 4.1  HGB 7.7*  HCT  24.5*  PLT 332    Chemistries  Recent Labs  Lab 09/03/17 1452  09/05/17 0639  NA 138   < > 140  K 7.2*   < > 5.1  CL 108   < > 110  CO2 23   < > 28  GLUCOSE 89   < > 91  BUN 37*   < > 17  CREATININE 2.32*   < > 1.15*  CALCIUM 7.5*   < > 6.8*  MG  --    < > 2.0  AST 24  --   --   ALT 13  --   --   ALKPHOS 84  --   --   BILITOT 0.7  --   --    < > = values in this interval not displayed.   Cardiac Enzymes Recent Labs  Lab 09/04/17 0525  TROPONINI <0.03   RADIOLOGY:  Dg Chest Port 1 View  Result Date: 09/03/2017 CLINICAL DATA:  77 year old female with a history of poor oxygenation EXAM: PORTABLE CHEST 1 VIEW COMPARISON:  08/15/2017, CT 08/15/2017 FINDINGS: Cardiomediastinal silhouette unchanged in size and contour. Mixed interstitial and airspace opacities the bilateral lungs, increased from the comparison. Blunting of the left costophrenic angle with retrocardiac opacity. Interlobular septal thickening. Interval removal of left IJ central catheter. Surgical changes of the right glenohumeral joint. IMPRESSION: Chest x-ray suggests pulmonary edema and small left pleural effusion. Infection cannot be excluded. Interval removal of left IJ central venous catheter. Electronically Signed   By: Gilmer MorJaime  Wagner D.O.   On: 09/03/2017  14:33   ASSESSMENT AND PLAN:   Leylanie Woodmansee  is a 77 y.o. female with a known history of arthritis, COPD with chronic oxygen use at 2 L, diabetes, gastroesophageal reflux disease, heart murmur, hypercholesterolemia, hypertension, pneumonia, renal insufficiency-was admitted to hospital last week with perirectal  abscess-I&D was done, was discharged with oral Bactrim 1 week ago.  She was sent to rehab center because of her declining functional status. At the rehab for last 1 week she has continued to progressively get worse to the point now staying very lethargic, not eating much, not having much urination.  *Acute hypoxic respiratory failure suspected pulmonary  edema versus CHF diastolic -empiric  on broad-spectrum IV antibiotics so far cultures negative--de-escalate abxs ?doxycyline or clindamycin -X-ray negative for pneumonia. No fever.  -patient now off BiPAP. Sats maintaining 92 to 94 on 2-3 L nasal cannula oxygen -wean as tolerated. -IV Lasix if needed -Procalcitonin negative, lactic acid neg--de-escalate abxs  * Ac on ch respi failure    Continue BiPAP, monitor in ICU.    Continue nebulizer therapy.  *Acute renal failure with severe hyperkalemia respect ATN with hypertension, poor PO intake, recent Bactrim -now improved with urgent hemodialysis -even with potassium of 7.2---6.8---5.3 -creatinine 2.32---2.35---1.44 -baseline creatinine 1.5 to 1.8 -received nephrology input -patient received  calcium gluconate, insulin plus dextrose, [place]-- in the ER  * COPD   Cont theophylline, Nebs.  *Anemia Continue to monitor, if drops we may need to have blood transfusion.  *DM-2 SSI for now  D/w dter Speech to evaluate swallow and resume rehab  Case discussed with Care Management/Social Worker. Management plans discussed with the patient, family and they are in agreement.  CODE STATUS: DNR  DVT Prophylaxis: heparin  TOTAL TIME TAKING CARE OF THIS PATIENT: 30** minutes.  >50% time spent on counselling and coordination of care  POSSIBLE D/C IN few DAYS, DEPENDING ON CLINICAL CONDITION.  Note: This dictation was prepared with Dragon dictation along with smaller phrase technology. Any transcriptional errors that result from this process are unintentional.  Enedina Finner M.D on 09/05/2017 at 2:02 PM  Between 7am to 6pm - Pager - [phone]  After 6pm go to [email or web address] - Social research officer, government  [facility] [place] [facility]  Office  [phone]  CC: Primary care physician; Mickey Farber, MDPatient ID: [name] No, female   DOB: Jun 19, 1940, 77 y.o.   MRN: [id number]

## 2017-09-05 NOTE — Progress Notes (Signed)
Report called to [place] RiverJadie, RN on 1A.  Patient's family at bedside and aware that patient is being moved to room 146.

## 2017-09-05 NOTE — Progress Notes (Signed)
bipap on standby 

## 2017-09-05 NOTE — [place] FL2 (Signed)
[place] MEDICAID FL2 LEVEL OF CARE SCREENING TOOL     IDENTIFICATION  Patient Name: Brenda Glenn Birthdate: 1940-05-12 Sex: female Admission Date (Current Location): 09/03/2017  [place] and [place] Number:  Chiropodist and Address:  [facility], [address] [place], [place] [address]      Provider Number: [id number]  Attending Physician Name and Address:  Enedina Finner, MD  Relative Name and Phone Number:  Shayden Gingrich (Spouse) [phone]; Ritta Slot (Daughter) [phone]; Claudette Stapler (Daughter) [phone]    Current Level of Care: Hospital Recommended Level of Care: Skilled Nursing Facility Prior Approval Number:    Date Approved/Denied:   PASRR Number: [id number]  Discharge Plan: SNF    Current Diagnoses: Patient Active Problem List   Diagnosis Date Noted  . Acute respiratory failure with hypoxia and hypercapnia (HCC)   . Hyperkalemia   . HCAP (healthcare-associated pneumonia) 09/03/2017  . Acute on chronic respiratory failure with hypoxia (HCC) 09/03/2017  . Osteoarthritis 08/30/2017  . Restless leg syndrome 08/21/2017  . Perianal abscess   . Sepsis (HCC) 08/15/2017  . Bilateral lower extremity edema 07/28/2017  . Lower extremity numbness 07/28/2017  . ARF (acute renal failure) (HCC) 06/20/2017  . 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 Multiple thyroid nodules 10/22/2016  . Lymphedema 01/06/2016  . Chronic venous insufficiency 01/06/2016  . Pain in limb 01/06/2016  . COPD (chronic obstructive pulmonary disease) (HCC) 01/06/2016  . Abnormality of gait 01/06/2016  . Hypomagnesemia 05/24/2015  . Type 2 diabetes mellitus (HCC) 11/16/2013  . Benign essential hypertension 11/16/2013  . GERD (gastroesophageal reflux disease) 11/16/2013  . Hyperlipidemia 11/16/2013    Orientation RESPIRATION BLADDER Height & Weight     Self, Situation, Place  O2(4L o2) Incontinent Weight: 183 lb 13.8 oz (83.4  kg) Height:  5\' 2"  (157.5 cm)  BEHAVIORAL SYMPTOMS/MOOD NEUROLOGICAL BOWEL NUTRITION STATUS      Incontinent Diet(NPO to be advanced after swallow study)  AMBULATORY STATUS COMMUNICATION OF NEEDS Skin   Extensive Assist Verbally Skin abrasions                       Personal Care Assistance Level of Assistance  Bathing, Feeding, Dressing, Total care Bathing Assistance: Maximum assistance Feeding assistance: Limited assistance Dressing Assistance: Maximum assistance     Functional Limitations Info    Sight Info: Adequate Hearing Info: Adequate Speech Info: Adequate    SPECIAL CARE FACTORS FREQUENCY  PT (By licensed PT), OT (By licensed OT)     PT Frequency: Up to 5X per week OT Frequency: Up to 3X per week            Contractures Contractures Info: Not present    Additional Factors Info  Code Status, Allergies, Isolation Precautions Code Status Info: DNR Allergies Info:  Tequin Gatifloxacin, Bupropion, Keflex Cephalexin, Lisinopril, Varenicline     Isolation Precautions Info: Contact precautions for MRSA     Current Medications (09/05/2017):  This is the current hospital active medication list Current Facility-Administered Medications  Medication Dose Route Frequency Provider Last Rate Last Dose  . aspirin EC tablet 81 mg  81 mg Oral Daily Altamese Dilling, MD      . bisacodyl (DULCOLAX) EC tablet 10 mg  10 mg Oral Daily Altamese Dilling, MD      . calcium carbonate (TUMS - dosed in mg elemental calcium) chewable tablet 1,000 mg  1,000 mg Oral TID WC Altamese Dilling, MD      .  calcium gluconate 1 g in sodium chloride 0.9 % 100 mL IVPB  1 g Intravenous Once Emily FilbertWilliams, Jonathan E, MD      . chlorhexidine (PERIDEX) 0.12 % solution 15 mL  15 mL Mouth Rinse BID Tukov-Yual, Magdalene S, NP   15 mL at 09/05/17 1038  . Chlorhexidine Gluconate Cloth 2 % PADS 6 each  6 each Topical Q0600 Mosetta PigeonSingh, Harmeet, MD   6 each at 09/05/17 0600  . dextrose 5 %-0.45 %  sodium chloride infusion   Intravenous Continuous Erin FullingKasa, Kurian, MD 50 mL/hr at 09/04/17 1727    . docusate sodium (COLACE) capsule 100 mg  100 mg Oral BID Altamese DillingVachhani, Vaibhavkumar, MD      . docusate sodium (COLACE) capsule 100 mg  100 mg Oral BID PRN Altamese DillingVachhani, Vaibhavkumar, MD      . fentaNYL (SUBLIMAZE) injection 12.5 mcg  12.5 mcg Intravenous Q2H PRN Tukov-Yual, Magdalene S, NP   12.5 mcg at 09/05/17 0138  . fluticasone (FLONASE) 50 MCG/ACT nasal spray 2 spray  2 spray Each Nare Daily PRN Altamese DillingVachhani, Vaibhavkumar, MD      . folic acid (FOLVITE) tablet 1 mg  1 mg Oral Daily Altamese DillingVachhani, Vaibhavkumar, MD      . guaiFENesin (MUCINEX) 12 hr tablet 600 mg  600 mg Oral Q12H PRN Altamese DillingVachhani, Vaibhavkumar, MD      . heparin injection 5,000 Units  5,000 Units Subcutaneous Q8H Altamese DillingVachhani, Vaibhavkumar, MD   5,000 Units at 09/05/17 0636  . LORazepam (ATIVAN) tablet 0.5 mg  0.5 mg Oral Q4H PRN Altamese DillingVachhani, Vaibhavkumar, MD      . MEDLINE mouth rinse  15 mL Mouth Rinse q12n4p Tukov-Yual, Magdalene S, NP   15 mL at 09/04/17 1624  . mometasone-formoterol (DULERA) 200-5 MCG/ACT inhaler 2 puff  2 puff Inhalation BID Altamese DillingVachhani, Vaibhavkumar, MD   2 puff at 09/05/17 1036  . nicotine (NICODERM CQ - dosed in mg/24 hours) patch 21 mg  21 mg Transdermal Daily Altamese DillingVachhani, Vaibhavkumar, MD   21 mg at 09/04/17 1136  . norepinephrine (LEVOPHED) 4mg  in D5W 250mL premix infusion  0-40 mcg/min Intravenous Titrated Erin FullingKasa, Kurian, MD   Stopped at 09/05/17 0600  . oxyCODONE (Oxy IR/ROXICODONE) immediate release tablet 15 mg  15 mg Oral Q6H Altamese DillingVachhani, Vaibhavkumar, MD      . pantoprazole (PROTONIX) EC tablet 40 mg  40 mg Oral Daily Altamese DillingVachhani, Vaibhavkumar, MD      . patiromer [facility](VELTASSA) packet 8.4 g  8.4 g Oral Daily Emily FilbertWilliams, Jonathan E, MD   Stopped at 09/03/17 1704  . piperacillin-tazobactam (ZOSYN) IVPB 3.375 g  3.375 g Intravenous Q8H Enedina FinnerPatel, Sona, MD   Stopped at 09/05/17 1112  . theophylline (UNIPHYL) 400 MG 24 hr tablet 400 mg  400 mg Oral  Daily Altamese DillingVachhani, Vaibhavkumar, MD      . tiotropium [facility](SPIRIVA) inhalation capsule 18 mcg  18 mcg Inhalation Daily Altamese DillingVachhani, Vaibhavkumar, MD   18 mcg at 09/05/17 1036  . vancomycin (VANCOCIN) IVPB 1000 mg/200 mL premix  1,000 mg Intravenous Q24H Enedina FinnerPatel, Sona, MD   Stopped at 09/04/17 2340     Discharge Medications: Please see discharge summary for a list of discharge medications.  Relevant Imaging Results:  Relevant Lab Results:   Additional Information SS#[id number]  Judi CongKaren M Jaciel Diem, LCSW

## 2017-09-05 NOTE — Progress Notes (Signed)
Dr. Belia HemanKasa present and gave order to transfer patient to floor with tele and for ice chips only and to wait for ST eval tomorrow.

## 2017-09-05 NOTE — Progress Notes (Signed)
Brenda [facility], [place] 09/05/17  Subjective:   Patient is doing better overall.  Currently on nasal cannula oxygen supplementation.  More alert compared to yesterday.  Able to follow commands.  Oriented to time and place.  Able to recognize family members.  Yesterday had a choking spell with clear liquids.  Swallow study pending for today.  Serum creatinine has improved further down to 1.15.  Urine output 1125 cc.  Potassium is in the normal range 5.1   Objective:  Vital signs in last 24 hours:  Temp:  [97 F (36.1 C)-98.4 F (36.9 C)] 97 F (36.1 C) (07/07 1030) Pulse Rate:  [66-85] 70 (07/07 1030) Resp:  [13-20] 13 (07/07 1030) BP: (93-127)/(40-69) 115/46 (07/07 1030) SpO2:  [86 %-96 %] 93 % (07/07 1030)  Weight change:  Filed Weights   09/03/17 1306 09/03/17 1900  Weight: 84.8 kg (187 lb) 83.4 kg (183 lb 13.8 oz)    Intake/Output:    Intake/Output Summary (Last 24 hours) at 09/05/2017 1105 Last data filed at 09/05/2017 1000 Gross per 24 hour  Intake 1531.69 ml  Output 1125 ml  Net 406.69 ml     Physical Exam: General:  No acute distress, laying in the bed  HEENT  oral mucous membranes appear dry, anicteric  Neck  supple,   Pulm/lungs  shallow breathing effort, no crackles, decreased breath sounds at bases.  O2 by [place]  CVS/Heart  irregular rhythm, no rub or gallop  Abdomen:   Soft, nontender  Extremities:  1+ pitting edema in the arms and thighs,  Neurologic:  Alert, oriented   Skin:  warm, dry, no acute rashes  Access:  femoral dialysis cathter    Foley in place    Basic Metabolic Panel:  Recent Labs  Lab 09/03/17 1452 09/03/17 1958 09/04/17 0525 09/05/17 0639  NA 138 141 141 140  K 7.2* 6.8* 5.3* 5.1  CL 108 108 107 110  CO2 23 24 27 28   GLUCOSE 89 79 85 91  BUN 37* 38* 23 17  CREATININE 2.32* 2.35* 1.44* 1.15*  CALCIUM 7.5* 7.9* 7.4* 6.8*  MG  --  2.4  --  2.0  PHOS  --  4.6  --  2.1*     CBC: Recent Labs  Lab  09/03/17 1452 09/03/17 2346 09/04/17 0525 09/05/17 0639  WBC 3.1* 3.3* 5.3 4.1  NEUTROABS 2.5  --   --   --   HGB 8.1* 8.5* 8.4* 7.7*  HCT 26.0* 27.3* 25.7* 24.5*  MCV 99.3 98.5 98.0 98.6  PLT 378 434 451* 332      Lab Results  Component Value Date   HEPBSAG Negative 09/03/2017   HEPBSAB Non Reactive 09/03/2017      Microbiology:  Recent Results (from the past 240 hour(s))  Blood Culture (routine x 2)     Status: None (Preliminary result)   Collection Time: 09/03/17  2:11 PM  Result Value Ref Range Status   Specimen Description BLOOD LEFT ARM  Final   Special Requests   Final    BOTTLES DRAWN AEROBIC AND ANAEROBIC Blood Culture adequate volume   Culture   Final    NO GROWTH 2 DAYS Performed at [facility], [address] [place], [place] [address]    Report Status PENDING  Incomplete  Blood Culture (routine x 2)     Status: None (Preliminary result)   Collection Time: 09/03/17  7:58 PM  Result Value Ref Range Status   Specimen Description BLOOD  RIGHT WRIST  Final   Special Requests   Final    BOTTLES DRAWN AEROBIC AND ANAEROBIC Blood Culture results may not be optimal due to an inadequate volume of blood received in culture bottles   Culture   Final    NO GROWTH 2 DAYS Performed at [facility], [address] [place], [place] [address]    Report Status PENDING  Incomplete    Coagulation Studies: Recent Labs    09/03/17 1452  LABPROT 13.4  INR 1.03    Urinalysis: Recent Labs    09/03/17 1324  COLORURINE AMBER*  LABSPEC 1.018  PHURINE 5.0  GLUCOSEU NEGATIVE  HGBUR NEGATIVE  BILIRUBINUR NEGATIVE  KETONESUR NEGATIVE  PROTEINUR 100*  NITRITE NEGATIVE  LEUKOCYTESUR NEGATIVE      Imaging: Dg Chest Port 1 View  Result Date: 09/03/2017 CLINICAL DATA:  77 year old female with a history of poor oxygenation EXAM: PORTABLE CHEST 1 VIEW COMPARISON:  08/15/2017, CT 08/15/2017 FINDINGS: Cardiomediastinal silhouette unchanged in size  and contour. Mixed interstitial and airspace opacities the bilateral lungs, increased from the comparison. Blunting of the left costophrenic angle with retrocardiac opacity. Interlobular septal thickening. Interval removal of left IJ central catheter. Surgical changes of the right glenohumeral joint. IMPRESSION: Chest x-ray suggests pulmonary edema and small left pleural effusion. Infection cannot be excluded. Interval removal of left IJ central venous catheter. Electronically Signed   By: Gilmer MorJaime  Wagner D.O.   On: 09/03/2017 14:33     Medications:   . calcium gluconate 1 GM IVPB    . dextrose 5 % and 0.45% NaCl 50 mL/hr at 09/04/17 1727  . norepinephrine (LEVOPHED) Adult infusion Stopped (09/05/17 0600)  . piperacillin-tazobactam (ZOSYN)  IV 3.375 g (09/05/17 0655)  . vancomycin Stopped (09/04/17 2340)   . aspirin EC  81 mg Oral Daily  . bisacodyl  10 mg Oral Daily  . calcium carbonate  1,000 mg Oral TID WC  . chlorhexidine  15 mL Mouth Rinse BID  . Chlorhexidine Gluconate Cloth  6 each Topical Q0600  . docusate sodium  100 mg Oral BID  . folic acid  1 mg Oral Daily  . heparin  5,000 Units Subcutaneous Q8H  . mouth rinse  15 mL Mouth Rinse q12n4p  . mometasone-formoterol  2 puff Inhalation BID  . nicotine  21 mg Transdermal Daily  . oxyCODONE  15 mg Oral Q6H  . pantoprazole  40 mg Oral Daily  . patiromer  8.4 g Oral Daily  . theophylline  400 mg Oral Daily  . tiotropium  18 mcg Inhalation Daily   docusate sodium, fentaNYL (SUBLIMAZE) injection, fluticasone, guaiFENesin, LORazepam  Assessment/ Plan:  77 y.o. female with medical problems of COPD, diabetes, GERD, who [facility] for evaluation of lethargy, decreased appetite, acute confusion.   1. Acute renal failure, baseline creatinine 0.61 from August 25, 2017 2. Hyperkalemia 3. Acute pulmonary edema 4. Generalized edema 5. Urinary retention, likely from narcotics 5. Altered mental status  Likely  multifactorial with contribution from hypotension, Bactrim,  Decreased level of consciousness likely from gabapentin  Hold bactrim, Gabapentin, Avoid NSAIDs, iv contrast Mental status, volume status and respiratory status have all improved Serum creatinine down to 1.15 No further need for dialysis is anticipated.  Dialysis catheter can be removed at any time. We will follow    LOS: 2 Sinthia Karabin Thedore MinsSingh 7/7/201911:05 AM  [facility] [place], [place] [phone]  Note: This note was prepared with Dragon dictation. Any transcription errors  are unintentional

## 2017-09-05 NOTE — Plan of Care (Signed)
Patient did not use Bipap this evening.  Remains on Levo for low BP  . Patient very tearful and crying this shift. PRN Fentanyl given x 12.  Patient stating 'I want to die'.  Patient alert and able to make needs known.  Daughter at bedside.  Will continue to monitor.

## 2017-09-05 NOTE — Clinical Social Work Note (Addendum)
Clinical Social Work Assessment  Patient Details  Name: Brenda Glenn MRN: [id number] Date of Birth: [birth year]  Date of referral:  09/05/17               Reason for consult:  Facility Placement                Permission sought to share information with:  Chartered certified accountant granted to share information::  Yes, Verbal Permission Granted  Name::        Agency::  [facility]  Relationship::     Contact Information:     Housing/Transportation Living arrangements for the past 2 months:  [place] of Information:  Medical Team, Adult Children, Facility Patient Interpreter Needed:  None Criminal Activity/Legal Involvement Pertinent to Current Situation/Hospitalization:  No - Comment as needed Significant Relationships:  Adult Children, Warehouse manager Lives with:  Facility Resident Do you feel safe going back to the place where you live?  Yes Need for family participation in patient care:  Yes (Comment)(Patient is confused)  Care giving concerns:  Patient admitted from [facility]   Social Worker assessment / plan:  The CSW met with the patient and her daughter, [name], at bedside to discuss discharge planning. The patient was restless and lethargic, and she was confused about place, time, and situation. The patient's daughter confirmed that the patient admitted from [facility] where she has been a resident for short term rehab for the past week secondary to sepsis. The current plan is for the patient to return to [facility] pending bed availability and medical stability. The patient is also followed by palliative care in the community.  A swallow study is pending, and the patient is currently on IV ABX. CSW will continue to follow for discharge planning. Employment status:  Retired Forensic scientist:  Medicare PT Recommendations:  Not assessed at this time Information / Referral to  community resources:     Patient/Family's Response to care:  The patient and her daughter thanked the CSW,  Patient/Family's Understanding of and Emotional Response to Diagnosis, Current Treatment, and Prognosis:  The patient is confused; however, her family understands her continued need for SNF level care.  Emotional Assessment Appearance:  Appears stated age Attitude/Demeanor/Rapport:  Lethargic Affect (typically observed):  Restless Orientation:  Oriented to Self Alcohol / Substance use:  Never Used Psych involvement (Current and /or in the community):  No (Comment)  Discharge Needs  Concerns to be addressed:  Care Coordination, Discharge Planning Concerns Readmission within the last 30 days:  Yes Current discharge risk:  Chronically ill Barriers to Discharge:  Continued Medical Work up   [employer], LCSW 09/05/2017, 11:44 AM

## 2017-09-05 NOTE — Progress Notes (Signed)
Pt arrived to room 146 from ICU. Pt alert to self. Husband at the bedside. Skin assessment completed with Marchelle FolksAmanda, RN. Sacral dressing on, bed in lowest position call bell in reach and bed alarm on.

## 2017-09-05 NOTE — Progress Notes (Signed)
Patient moved to room 146 by bed with NT.  Patient alert with no distress noted when leaving ICU on o2.  Dialysis catheter to right groin removed per Dr. Thedore MinsSingh.  Pressure held at site for 10 minutes due to bleeding after removal.  Bleeding stopped and pressure dressing applied.  This RN called and spoke with Tobi BastosAnna, RN and made her aware of dialysis catheter site and removal.

## 2017-09-05 NOTE — Progress Notes (Addendum)
Pharmacy Antibiotic Note  [name] NoJamie Graves Sardinas is a 77 y.o. female admitted on 09/03/2017 with perirectal abscess.  Pharmacy has been consulted for vancomycin and Zosyn dosing.  Plan: Zosyn 3.375 g EI q 8 hours.   Vancomycin 1 g IV every 24 hours. Trough level ordered for 7/8 at 21:30.  Height: 5\' 2"  (157.5 cm) Weight: 183 lb 13.8 oz (83.4 kg) IBW/kg (Calculated) : 50.1  Temp (24hrs), Avg:97.8 F (36.6 C), Min:97 F (36.1 C), Max:98.4 F (36.9 C)  Recent Labs  Lab 09/03/17 1452 09/03/17 1958 09/03/17 2345 09/03/17 2346 09/04/17 0525 09/05/17 0639  WBC 3.1*  --   --  3.3* 5.3 4.1  CREATININE 2.32* 2.35*  --   --  1.44* 1.15*  LATICACIDVEN  --  1.0 0.6  --   --   --     Estimated Creatinine Clearance: 41.7 mL/min (A) (by C-G formula based on SCr of 1.15 mg/dL (H)).    Allergies  Allergen Reactions  . Tequin [Gatifloxacin] Shortness Of Breath  . Bupropion     Other reaction(s): Other (See Comments) shaking  . Keflex [Cephalexin] Other (See Comments)    Unknown  . Lisinopril Other (See Comments)    Unknown  . Varenicline Nausea Only and Nausea And Vomiting    Antimicrobials this admission: aztreonam 7/5 vancomycin 7/5 >>  Zosyn 7/5 >>  Dose adjustments this admission: 7/6  Vancomycin 750mg  Q36H  >> 1g Q24H 7/6  Zosyn 3.375g Q12H >> Q8H  Microbiology results: 7/5 BCx: sent  Thank you for allowing pharmacy to be a part of this patient's care.  Stormy CardKatsoudas,Braison Snoke K, [facility] 09/05/2017 11:20 AM

## 2017-09-06 ENCOUNTER — Encounter: Payer: Self-pay | Admitting: Surgery

## 2017-09-06 LAB — HEMOGLOBIN: Hemoglobin: 8.4 g/dL — ABNORMAL LOW (ref 12.0–16.0)

## 2017-09-06 LAB — GLUCOSE, CAPILLARY
GLUCOSE-CAPILLARY: 61 mg/dL — AB (ref 70–99)
GLUCOSE-CAPILLARY: 110 mg/dL — AB (ref 70–99)
GLUCOSE-CAPILLARY: 97 mg/dL (ref 70–99)
GLUCOSE-CAPILLARY: 91 mg/dL (ref 70–99)
GLUCOSE-CAPILLARY: 100 mg/dL — AB (ref 70–99)
GLUCOSE-CAPILLARY: 107 mg/dL — AB (ref 70–99)

## 2017-09-06 LAB — PATHOLOGIST SMEAR REVIEW

## 2017-09-06 LAB — PROCALCITONIN: Procalcitonin: <0.1 ng/mL

## 2017-09-06 MED ORDER — DEXTROSE-NACL 5-0.45 % IV SOLN
INTRAVENOUS | Status: DC
  Administered 2017-09-06 – 2017-09-07 (×2): via INTRAVENOUS

## 2017-09-06 MED ORDER — HYDROMORPHONE HCL 1 MG/ML IJ SOLN
0.5000 mg | Freq: Once | INTRAMUSCULAR | Status: AC
  Administered 2017-09-06: 0.5 mg via INTRAVENOUS
  Filled 2017-09-06: qty 1

## 2017-09-06 MED ORDER — DEXTROSE 50 % IV SOLN
INTRAVENOUS | Status: AC
  Filled 2017-09-06: qty 50

## 2017-09-06 MED ORDER — DEXTROSE 50 % IV SOLN
25.0000 mL | Freq: Once | INTRAVENOUS | Status: AC
  Administered 2017-09-06: 25 mL via INTRAVENOUS

## 2017-09-06 NOTE — Progress Notes (Addendum)
Pt is extremely restless and agitated all night despite Ativan IV. Dr. Caryn BeeMaier at the nurse's desk as of this writing, heard the pt hollering and screaming. Pt has scheduled Oxycodone PO for pain but can not tolerate. Dr. Caryn BeeMaier ordered for one time dose Dilaudid 0.5mg  IV. Will administer and continue to monitor.

## 2017-09-06 NOTE — Progress Notes (Signed)
Per MD patient and her daughter [name] wanted [place]. Clinical Social Worker (CSW) met with patient and her daughter [name] was at bedside. CSW made them aware that [facility] is full right now. Patient and daughter are agreeable for patient to return to [facility].   [employer], LCSW [phone]

## 2017-09-06 NOTE — Care Management Important Message (Signed)
Important Message  Patient Details  Name: Brenda Glenn MRN: [id number] Date of Birth: [birth year]   Medicare Important Message Given:  Yes    Brenda SiadAngela Lineth Thielke, RN 09/06/2017, 12:06 PM

## 2017-09-06 NOTE — Progress Notes (Signed)
Seen and examined. Doing better Left penrose drain removed. No evidence of perianal sepsis or un drained pockets. We will keep one penrose for now May f/u next week in the office No surgical indication

## 2017-09-06 NOTE — Progress Notes (Signed)
[facility] [place] [facility]   PATIENT NAME: Brenda Glenn    MR#:  [id number]  DATE OF BIRTH:  20-Aug-1940  SUBJECTIVE:   Came In after patient was found lethargic deconditioned and poor PO intake from the facility. dter in the room. Tearful this am due to not able to see great granddter!! [facility] REVIEW OF SYSTEMS:   Review of Systems  Constitutional: Negative for chills, fever and weight loss.  HENT: Negative for ear discharge, ear pain and nosebleeds.   Eyes: Negative for blurred vision, pain and discharge.  Respiratory: Negative for sputum production, shortness of breath, wheezing and stridor.   Cardiovascular: Negative for chest pain, palpitations, orthopnea and PND.  Gastrointestinal: Negative for abdominal pain, diarrhea, nausea and vomiting.  Genitourinary: Negative for frequency and urgency.  Musculoskeletal: Negative for back pain and joint pain.  Neurological: Positive for weakness. Negative for sensory change, speech change and focal weakness.  Psychiatric/Behavioral: Negative for depression and hallucinations. The patient is not nervous/anxious.    Tolerating Diet:pending speech eval Tolerating PT: rehab  DRUG ALLERGIES:   Allergies  Allergen Reactions  . Tequin [Gatifloxacin] Shortness Of Breath  . Bupropion     Other reaction(s): Other (See Comments) shaking  . Keflex [Cephalexin] Other (See Comments)    Unknown  . Lisinopril Other (See Comments)    Unknown  . Varenicline Nausea Only and Nausea And Vomiting    VITALS:  Blood pressure 117/62, pulse 62, temperature 98.4 F (36.9 C), temperature source Oral, resp. rate 19, height 5\' 2"  (1.575 m), weight 83.4 kg (183 lb 13.8 oz), SpO2 97 %.  PHYSICAL EXAMINATION:   Physical Exam  GENERAL:  77 y.o.-year-old patient lying in the bed with Glenn acute distress. Obese EYES: Pupils equal, round, reactive to light and accommodation. Glenn scleral icterus.  Extraocular muscles intact.  HEENT: Head atraumatic, normocephalic. Oropharynx and nasopharynx clear.  NECK:  Supple, Glenn jugular venous distention. Glenn thyroid enlargement, Glenn tenderness.  LUNGS: Normal breath sounds bilaterally, Glenn wheezing, rales, rhonchi. Glenn use of accessory muscles of respiration.  CARDIOVASCULAR: S1, S2 normal. Glenn murmurs, rubs, or gallops.  ABDOMEN: Soft, nontender, nondistended. Bowel sounds present. Glenn organomegaly or mass.  EXTREMITIES: Glenn cyanosis, clubbing or edema b/l.    NEUROLOGIC: moves all extremities well PSYCHIATRIC:  patient is very awake and alert SKIN: Glenn obvious rash, lesion, or ulcer. Drain+ perirectal  LABORATORY PANEL:  CBC Recent Labs  Lab 09/05/17 0639 09/06/17 1043  WBC 4.1  --   HGB 7.7* 8.4*  HCT 24.5*  --   PLT 332  --     Chemistries  Recent Labs  Lab 09/03/17 1452  09/05/17 0639  NA 138   < > 140  K 7.2*   < > 5.1  CL 108   < > 110  CO2 23   < > 28  GLUCOSE 89   < > 91  BUN 37*   < > 17  CREATININE 2.32*   < > 1.15*  CALCIUM 7.5*   < > 6.8*  MG  --    < > 2.0  AST 24  --   --   ALT 13  --   --   ALKPHOS 84  --   --   BILITOT 0.7  --   --    < > = values in this interval not displayed.   Cardiac Enzymes Recent Labs  Lab 09/04/17 [phone]  TROPONINI <0.03   RADIOLOGY:  Glenn results found. ASSESSMENT AND PLAN:   Burnette Valenti  is a 77 y.o. female with a known history of arthritis, COPD with chronic oxygen use at 2 L, diabetes, gastroesophageal reflux disease, heart murmur, hypercholesterolemia, hypertension, pneumonia, renal insufficiency-was admitted to hospital last week with perirectal  abscess-I&D was done, was discharged with oral Bactrim 1 week ago.  She was sent to rehab center because of her declining functional status. At the rehab for last 1 week she has continued to progressively get worse to the point now staying very lethargic, not eating much, not having much urination.  *Acute hypoxic respiratory failure  suspected pulmonary edema versus CHF diastolic -X-ray negative for pneumonia. Glenn fever.  -patient now off BiPAP. Sats maintaining 92 to 94 on 2-3 L nasal cannula oxygen -wean as tolerated. -IV Lasix if needed -Procalcitonin negative x2, lactic acid neg-- d/c abxs  * Ac on ch respi failureresolved  - Continue nebulizer therapy.  *Acute renal failure with severe hyperkalemia respect ATN with hypotension, poor PO intake, recent Bactrim -now improved with urgent hemodialysis. GOOD UOP -even with potassium of 7.2---6.8---5.3--5.1 -creatinine 2.32---2.35---1.44 -baseline creatinine 1.5 to 1.8 -received nephrology input -patient received  calcium gluconate, insulin plus dextrose, [place]-- in the ER  * COPD   Cont theophylline, Nebs and oxygen  *Anemia Continue to monitor, if drops we may need to have blood transfusion. hgb 8.4.   *DM-2 SSI for now On dextrose gtt--d/c once po intake improves  *Peri-rectal abscess -completed abx treat inpt and out pt. Dr Everlene Farrier to evaluate again. Pt has drains+ D/w dter Speech to evaluate swallow and resume rehab  csw for d/c planning. Some concern for pt not wanting to go to [facility]. D/w pt and dter we may not have many options and at this pt need rehab to get better  Case discussed with Care Management/Social Worker. Management plans discussed with the patient, family and they are in agreement.  CODE STATUS: DNR  DVT Prophylaxis: heparin  TOTAL TIME TAKING CARE OF THIS PATIENT: 30** minutes.  >50% time spent on counselling and coordination of care  POSSIBLE D/C in am DEPENDING ON CLINICAL CONDITION.  Note: This dictation was prepared with Dragon dictation along with smaller phrase technology. Any transcriptional errors that result from this process are unintentional.  Enedina Finner M.D on 09/06/2017 at 11:38 AM  Between 7am to 6pm - Pager - [phone]  After 6pm go to [email or web address] - Social research officer, government  [facility]   Office  [phone]  CC: Primary care physician; Mickey Farber, MDPatient ID: Brenda Glenn, female   DOB: 06/20/1940, 77 y.o.   MRN: [id number]

## 2017-09-06 NOTE — Progress Notes (Signed)
Pt has been very restless and agitated. Pt has been repositioned on the bed multiple times but doesn't seem to help. Pt has PRN Ativan 0.5mg  PO but pt is NPO and cannot tolerate PO intake per report. Dr. Caryn BeeMaier paged and ordered Ativan 1mg  IV every 8hrs PRN. Will administer as ordered.

## 2017-09-06 NOTE — Evaluation (Addendum)
Clinical/Bedside Swallow Evaluation Patient Details  Name: Brenda Glenn MRN: [id number] Date of Birth: [birth year]  Today's Date: 09/06/2017 Time: SLP Start Time (ACUTE ONLY): 1130 SLP Stop Time (ACUTE ONLY): 1230 SLP Time Calculation (min) (ACUTE ONLY): 60 min  Past Medical History:  Past Medical History:  Diagnosis Date  . Arthritis    all over  . Back pain   . COPD (chronic obstructive pulmonary disease) (HCC)    O2 use continuosly at 2 liters/ some  asthma symptoms  . Diabetes mellitus without complication (HCC)    diet controlled  . GERD (gastroesophageal reflux disease)   . Heart murmur    lifelong  . Hypercholesterolemia   . Hypertension    controlled on meds  . Pneumonia 2015   [facility] hospitalized  . Renal insufficiency    early stage kidney failure  . Shortness of breath dyspnea   . Sleep apnea    2nd sleep study said pt does not have sleep apnea  . Swelling    of feet and legs   Past Surgical History:  Past Surgical History:  Procedure Laterality Date  . ABDOMINAL HYSTERECTOMY  1988  . BACK SURGERY  1978 and 1995   residual nerve damage legs-weakness  . BLADDER SURGERY  2017  . CATARACT EXTRACTION Right   . CATARACT EXTRACTION W/PHACO Left 09/05/2014   Procedure: CATARACT EXTRACTION PHACO AND INTRAOCULAR LENS PLACEMENT (IOC);  Surgeon: Lockie Molahadwick Brasington, MD;  [facility];  Service: Ophthalmology;  Laterality: Left;  DIABETIC  . CHOLECYSTECTOMY  2011  . INCISION AND DRAINAGE PERIRECTAL ABSCESS N/A 08/20/2017   Procedure: IRRIGATION AND DEBRIDEMENT PERIRECTAL ABSCESS;  Surgeon: Leafy RoPabon, Diego F, MD;  Location: [facility];  Service: General;  Laterality: N/A;  . JOINT REPLACEMENT Right 2009  . RECTAL EXAM UNDER ANESTHESIA N/A 08/20/2017   Procedure: RECTAL EXAM UNDER ANESTHESIA;  Surgeon: Leafy RoPabon, Diego F, MD;  Location: [facility];  Service: General;  Laterality: N/A;  . REPLACEMENT TOTAL KNEE Right   . REVERSE SHOULDER ARTHROPLASTY Right 01/08/2017    Procedure: REVERSE SHOULDER ARTHROPLASTY;  Surgeon: Signa KellPatel, Sunny, MD;  Location: [facility];  Service: Orthopedics;  Laterality: Right;   HPI:  Pt is a 77 y.o. female with a known history of arthritis, COPD with chronic oxygen use at 2 L, diabetes, gastroesophageal reflux disease, heart murmur, hypercholesterolemia, hypertension, pneumonia, renal insufficiency-was admitted to hospital last week with perirectal horseshoe abscess-I&D was done, was discharged with oral Bactrim 1 week ago.  She was sent to rehab center because of her declining functional status. She began to decline at her Rehab center and was transferred to this facility.    Assessment / Plan / Recommendation Clinical Impression  Pt appears to present w/ adequate oropharyngeal phase swallowing when following general aspiration precautions and when supported during her meal w/ feeding during the oral intake. Pt is weak and needs support w/ taking bites/sips though she can hold her own Cup to drink.  Pt consumed po trials w/ no immediate, overt s/s of aspiration noted - no decline in vocal quality or respiratory status during/post po's. Pt appeared to present w/ timely A-P transfer of trials for swallowing. Oral phase appeared grossly wfl for bolus management of the liquids and softened solids, purees. Pt did benefit from the solids more broken down and moistened for easier mastication and oral clearing. It was also recommended for pt to alternate foods/liquids to aid clearing overall.  Recommend a dysphagia level 3(MECH SOFT) w/ thin liquids via Cup;  general aspiration precautions. Support pt w/ positioning upright and feeding support at meals. Recommend a Dietician f/u for nutritional support as needed. Pt does wear dentures(top) and can place these. Recommend further education w/ diet consistencies that can best support pt's oral intake while during Rehab as needed. NSG and MD updated. Pt/family agreed. SLP Visit Diagnosis: Dysphagia,  unspecified (R13.10)    Aspiration Risk  (reduced when following general aspiration precautions)    Diet Recommendation  dysphagia level 3 (mech soft) w/ thin liquids via CUP - No Straws at this time. General aspiration precautions; assistance w/ feeding at meals d/t overall weakness  Medication Administration: Whole meds with puree(crushed as needed/able to for safer swallowing)    Other  Recommendations Recommended Consults: (dietician f/u) Oral Care Recommendations: Oral care BID;Staff/trained caregiver to provide oral care Other Recommendations: (n/a)   Follow up Recommendations None      Frequency and Duration min 1 x/week  1 week       Prognosis Prognosis for Safe Diet Advancement: Fair(-Good) Barriers to Reach Goals: (deconditioned status)      Swallow Study   General Date of Onset: 09/03/17 HPI: Pt is a 77 y.o. female with a known history of arthritis, COPD with chronic oxygen use at 2 L, diabetes, gastroesophageal reflux disease, heart murmur, hypercholesterolemia, hypertension, pneumonia, renal insufficiency-was admitted to hospital last week with perirectal horseshoe abscess-I&D was done, was discharged with oral Bactrim 1 week ago.  She was sent to rehab center because of her declining functional status. She began to decline at her Rehab center and was transferred to this facility.  Type of Study: Bedside Swallow Evaluation Previous Swallow Assessment: none reported Diet Prior to this Study: NPO(but had been a regular diet at admission; at home per dtr) Temperature Spikes Noted: No(wbc 4.1) Respiratory Status: Nasal cannula(3-4 liters) History of Recent Intubation: No Behavior/Cognition: Alert;Cooperative;Pleasant mood;Distractible;Requires cueing(mumbled speech) Oral Cavity Assessment: Within Functional Limits Oral Care Completed by SLP: Recent completion by staff Oral Cavity - Dentition: Dentures, top(native bottom dentition) Vision: Functional for  self-feeding Self-Feeding Abilities: Able to feed self;Needs assist;Needs set up;Total assist(d/t overall weakness) Patient Positioning: Upright in bed(needed positioning upright in bed) Baseline Vocal Quality: Low vocal intensity(mumbled speech at times) Volitional Cough: (Fair) Volitional Swallow: Able to elicit    Oral/Motor/Sensory Function Overall Oral Motor/Sensory Function: Within functional limits   Ice Chips Ice chips: Within functional limits Presentation: Spoon(fed; 3 trials)   Thin Liquid Thin Liquid: Within functional limits Presentation: Cup;Self Fed(assisted in support) Other Comments: consumed 10+ trials    Nectar Thick Nectar Thick Liquid: Not tested   Honey Thick Honey Thick Liquid: Not tested   Puree Puree: Within functional limits Presentation: Spoon;Self Fed(assisted; 6 trials)   Solid   GO   Solid: Impaired(mech soft trials) Presentation: Spoon(fed; 6 trials) Oral Phase Impairments: Impaired mastication(min slower) Oral Phase Functional Implications: Impaired mastication(min slower) Pharyngeal Phase Impairments: (none) Other Comments: needed boluses cut small, moistened, and easy to eat         Jerilynn Som, MS, CCC-SLP Naiya Corral 09/06/2017,6:24 PM

## 2017-09-06 NOTE — Progress Notes (Signed)
[facility] Brenda Glenn, [place] 09/06/17  Subjective:  Overall patient still appears quite weak. Creatinine at last check was 1.15. Hemoglobin remains low at 7.7 as well.    Objective:  Vital signs in last 24 hours:  Temp:  [97.2 F (36.2 C)-98.4 F (36.9 C)] 98.4 F (36.9 C) (07/07 2326) Pulse Rate:  [60-88] 62 (07/08 0820) Resp:  [13-19] 19 (07/07 2326) BP: (107-128)/(43-71) 117/62 (07/08 0820) SpO2:  [88 %-97 %] 97 % (07/08 0820)  Weight change:  Filed Weights   09/03/17 1306 09/03/17 1900  Weight: 84.8 kg (187 lb) 83.4 kg (183 lb 13.8 oz)    Intake/Output:    Intake/Output Summary (Last 24 hours) at 09/06/2017 1047 Last data filed at 09/06/2017 0926 Gross per 24 hour  Intake 850 ml  Output 1700 ml  Net -850 ml     Physical Exam: General:  No acute distress, laying in the bed  HEENT  oral mucous membranes appear dry, anicteric  Neck  supple,  Pulm/lungs  scattered rhonchi, normal effort  CVS/Heart  irregular rhythm, no rub or gallop  Abdomen:   Soft, nontender  Extremities:  1+ pitting edema arms and lower extremeties  Neurologic:  Alert, oriented, follows commands  Skin:  warm, dry, no acute rashes       Foley in place    Basic Metabolic Panel:  Recent Labs  Lab 09/03/17 1452 09/03/17 1958 09/04/17 0525 09/05/17 0639  NA 138 141 141 140  K 7.2* 6.8* 5.3* 5.1  CL 108 108 107 110  CO2 23 24 27 28   GLUCOSE 89 79 85 91  BUN 37* 38* 23 17  CREATININE 2.32* 2.35* 1.44* 1.15*  CALCIUM 7.5* 7.9* 7.4* 6.8*  MG  --  2.4  --  2.0  PHOS  --  4.6  --  2.1*     CBC: Recent Labs  Lab 09/03/17 1452 09/03/17 2346 09/04/17 0525 09/05/17 0639  WBC 3.1* 3.3* 5.3 4.1  NEUTROABS 2.5  --   --   --   HGB 8.1* 8.5* 8.4* 7.7*  HCT 26.0* 27.3* 25.7* 24.5*  MCV 99.3 98.5 98.0 98.6  PLT 378 434 451* 332      Lab Results  Component Value Date   HEPBSAG Negative 09/03/2017   HEPBSAB Non Reactive 09/03/2017       Microbiology:  Recent Results (from the past 240 hour(s))  Blood Culture (routine x 2)     Status: None (Preliminary result)   Collection Time: 09/03/17  2:11 PM  Result Value Ref Range Status   Specimen Description BLOOD LEFT ARM  Final   Special Requests   Final    BOTTLES DRAWN AEROBIC AND ANAEROBIC Blood Culture adequate volume   Culture   Final    NO GROWTH 3 DAYS Performed at [facility], [address] [place], [place] [address]    Report Status PENDING  Incomplete  Blood Culture (routine x 2)     Status: None (Preliminary result)   Collection Time: 09/03/17  7:58 PM  Result Value Ref Range Status   Specimen Description BLOOD RIGHT WRIST  Final   Special Requests   Final    BOTTLES DRAWN AEROBIC AND ANAEROBIC Blood Culture results may not be optimal due to an inadequate volume of blood received in culture bottles   Culture   Final    NO GROWTH 3 DAYS Performed at [facility], [address] [place], [place] [address]    Report Status  PENDING  Incomplete    Coagulation Studies: Recent Labs    09/03/17 1452  LABPROT 13.4  INR 1.03    Urinalysis: Recent Labs    09/03/17 1324  COLORURINE AMBER*  LABSPEC 1.018  PHURINE 5.0  GLUCOSEU NEGATIVE  HGBUR NEGATIVE  BILIRUBINUR NEGATIVE  KETONESUR NEGATIVE  PROTEINUR 100*  NITRITE NEGATIVE  LEUKOCYTESUR NEGATIVE      Imaging: No results found.   Medications:    . aspirin EC  81 mg Oral Daily  . calcium carbonate  1,000 mg Oral TID WC  . chlorhexidine  15 mL Mouth Rinse BID  . Chlorhexidine Gluconate Cloth  6 each Topical Q0600  . docusate sodium  100 mg Oral BID  . folic acid  1 mg Oral Daily  . heparin  5,000 Units Subcutaneous Q8H  . mouth rinse  15 mL Mouth Rinse q12n4p  . mometasone-formoterol  2 puff Inhalation BID  . nicotine  21 mg Transdermal Daily  . oxyCODONE  15 mg Oral Q6H  . pantoprazole  40 mg Oral Daily  . patiromer  8.4 g Oral Daily  . theophylline   400 mg Oral Daily  . tiotropium  18 mcg Inhalation Daily   fluticasone, guaiFENesin, LORazepam  Assessment/ Plan:  77 y.o. female with medical problems of COPD, diabetes, GERD, who was admitted to [facility] on 09/03/2017 for evaluation of lethargy, decreased appetite, acute confusion.   1. Acute renal failure, baseline creatinine 0.61 from August 25, 2017 2. Hyperkalemia 3. Acute pulmonary edema 4. Generalized edema 5. Urinary retention, likely from narcotics 5. Altered mental status  Likely multifactorial with contribution from hypotension, Bactrim,  Decreased level of consciousness likely from gabapentin  Plan: Patient appears to be resting comfortably at bedside.  Most recent creatinine was 1.14.  No additional indication for dialysis at the moment.  Serum potassium was also corrected.  Continue to monitor renal parameters daily for now.  Overall still remains quite weak and will likely need significant rehabilitation.    LOS: 3 Brenda Glenn 7/8/201910:47 AM  [facility], [place], [place] [phone]  Note: This note was prepared with Dragon dictation. Any transcription errors are unintentional

## 2017-09-06 NOTE — Progress Notes (Signed)
Clinical Child psychotherapistocial Worker (CSW) confirmed with [employer] admissions coordinator at [facility] that patient can return to [facility] when medically stable.   [employer]Bailey Ragna Kramlich, LCSW [phone]

## 2017-09-06 NOTE — Progress Notes (Addendum)
Hypoglycemic Event  CBG: 61  Treatment: D50 IV 25 mL  Symptoms: None  Follow-up CBG: Time: 0534   CBG Result: 110  Possible Reasons for Event: Inadequate meal intake  Comments/MD notified: Dr. Marjie SkiffSridharan ordered to give D50 25ml IV stat and increase rate of current IV fluid from 50 to 2485ml/hr.    Jannelly Bergren, [employer]a Mae C

## 2017-09-06 NOTE — Progress Notes (Signed)
Pt's CBG=61. Dr. Marjie SkiffSridharan paged, awaiting for call back.

## 2017-09-06 NOTE — Progress Notes (Signed)
Please note patient has a pending outpatient PALLIATIVE referral from last admission, Palliative NP to [facility] to see patient today and found out she had been re-hospitalized. CSW [employer] Hughes [employer]Bailey Sample made aware. Dayna BarkerKaren Robertson RN, BSN, [facility], hospital liaison [phone]

## 2017-09-07 LAB — GLUCOSE, CAPILLARY
GLUCOSE-CAPILLARY: 76 mg/dL (ref 70–99)
GLUCOSE-CAPILLARY: 83 mg/dL (ref 70–99)
GLUCOSE-CAPILLARY: 83 mg/dL (ref 70–99)
GLUCOSE-CAPILLARY: 112 mg/dL — AB (ref 70–99)

## 2017-09-07 MED ORDER — METOPROLOL SUCCINATE ER 25 MG PO TB24: 25.0000 mg | ORAL_TABLET | Freq: Every day | ORAL | 0 refills | Status: AC

## 2017-09-07 NOTE — Discharge Summary (Addendum)
[facility] [place] [facility]   PATIENT NAME: Brenda Glenn    MR#:  [id number]  DATE OF BIRTH:  1941/02/15  DATE OF ADMISSION:  09/03/2017 ADMITTING PHYSICIAN: Altamese Dilling, MD  DATE OF DISCHARGE: 09/07/2017  PRIMARY CARE PHYSICIAN: Mickey Farber, MD    ADMISSION DIAGNOSIS:  HCAP (healthcare-associated pneumonia) [J18.9] Acute respiratory failure with hypoxia and hypercapnia (HCC) [J96.01, J96.02]  DISCHARGE DIAGNOSIS:  *Acute hypoxic respiratory failure suspected pulmonary edema versus CHF diastolic *Acute renal failure with severe hyperkalemia respect ATN with hypotension, poor PO intake, recent Bactrim--required temp HD *h/o peri-rectal abscess ---s/p drain. 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 Arthritis    all over  . Back pain   . COPD (chronic obstructive pulmonary disease) (HCC)    O2 use continuosly at 2 liters/ some  asthma symptoms  . Diabetes mellitus without complication (HCC)    diet controlled  . GERD (gastroesophageal reflux disease)   . Heart murmur    lifelong  . Hypercholesterolemia   . Hypertension    controlled on meds  . Pneumonia 2015   [facility] hospitalized  . Renal insufficiency    early stage kidney failure  . Shortness of breath dyspnea   . Sleep apnea    2nd sleep study said pt does not have sleep apnea  . Swelling    of feet and legs    HOSPITAL COURSE:   [name] a77 y.o.femalewith a known history of arthritis, COPD with chronic oxygen use at 2 L, diabetes, gastroesophageal reflux disease, heart murmur, hypercholesterolemia, hypertension, pneumonia, renal insufficiency-was admitted to hospital last week with perirectal  abscess-I&D was done,was discharged with oral Bactrim 1 week ago. She was sent to rehab center because of her declining functional status. At the rehab for last 1 week she has continued to progressively get  worse to the point now staying very lethargic, not eating much, not having much urination.  *Acute hypoxic respiratory failure suspected pulmonary edema versus CHF diastolic -X-ray negative for pneumonia. No fever. -patient now off BiPAP. Sats maintaining 92 to 94 on 2-3 L nasal cannula oxygen -wean as tolerated. -IV Lasix if needed -Procalcitonin negative x2, lactic acid neg-- d/c abxs  * Ac on ch respi failure resolved -Continue nebulizer therapy.  *Acute renal failure with severe hyperkalemia respect ATN with hypotension, poor PO intake, recent Bactrim -now improved with urgent hemodialysis x 1 [facility]OP -even with potassium of 7.2---6.8---5.3--5.1 -creatinine 2.32---2.35---1.44 -baseline creatinine 1.5 to 1.8 -received nephrology input -patient received  calcium gluconate, insulin plus dextrose,Valtassa-- in the ER  * COPD Cont theophylline, Nebs and oxygen  *Anemia Continue to monitor, if drops we may need to have blood transfusion. hgb 8.4.   *DM-2 SSI for now stable  *Peri-rectal abscess -completed abx treat inpt and out pt. Dr Everlene Farrier to evaluate again. Pt has drains+ removed left perirectal drain. Has 1 more drain. F/u next week with dr pabon  D/w dter D/c to rehab with Palliative care to follow  CONSULTS OBTAINED:    DRUG ALLERGIES:   Allergies  Allergen Reactions  . Tequin [Gatifloxacin] Shortness Of Breath  . Bupropion     Other reaction(s): Other (See Comments) shaking  . Keflex [Cephalexin] Other (See Comments)    Unknown  . Lisinopril Other (See Comments)    Unknown  . Varenicline Nausea Only and Nausea And Vomiting    DISCHARGE MEDICATIONS:   Allergies as of  09/07/2017      Reactions   Tequin [gatifloxacin] Shortness Of Breath   Bupropion    Other reaction(s): Other (See Comments) shaking   Keflex [cephalexin] Other (See Comments)   Unknown   Lisinopril Other (See Comments)   Unknown   Varenicline Nausea Only, Nausea And  Vomiting      Medication List    STOP taking these medications   felodipine 10 MG 24 hr tablet Commonly known as:  PLENDIL   gabapentin 400 MG capsule Commonly known as:  NEURONTIN   nicotine 14 mg/24hr patch Commonly known as:  NICODERM CQ - dosed in mg/24 hours   oxyCODONE 15 MG immediate release tablet Commonly known as:  ROXICODONE   sulfamethoxazole-trimethoprim 800-160 MG tablet Commonly known as:  BACTRIM DS,SEPTRA DS     TAKE these medications   albuterol 108 (90 Base) MCG/ACT inhaler Commonly known as:  PROVENTIL HFA;VENTOLIN HFA Inhale 2 puffs into the lungs every 6 (six) hours as needed for wheezing or shortness of breath.   allopurinol 100 MG tablet Commonly known as:  ZYLOPRIM Take 200 mg by mouth daily.   aspirin EC 81 MG tablet Take 81 mg by mouth daily.   bisacodyl 5 MG EC tablet Commonly known as:  DULCOLAX Take 2 tablets (10 mg total) by mouth daily.   calcium carbonate 500 MG chewable tablet Commonly known as:  TUMS - dosed in mg elemental calcium Chew 2 tablets (1,000 mg total) by mouth 3 (three) times daily with meals.   diclofenac 1.3 % Ptch Commonly known as:  FLECTOR Place 1 patch onto the skin daily as needed (for pain).   docusate sodium 100 MG capsule Commonly known as:  COLACE Take 1 capsule (100 mg total) by mouth 2 (two) times daily.   ezetimibe 10 MG tablet Commonly known as:  ZETIA Take 10 mg by mouth at bedtime.   fluticasone 50 MCG/ACT nasal spray Commonly known as:  FLONASE Place 2 sprays into both nostrils daily as needed for allergies.   fluticasone-salmeterol 115-21 MCG/ACT inhaler Commonly known as:  ADVAIR HFA Inhale 2 puffs into the lungs 2 (two) times daily.   folic acid 1 MG tablet Commonly known as:  FOLVITE Take 1 tablet (1 mg total) by mouth daily.   LORazepam 0.5 MG tablet Commonly known as:  ATIVAN Take 0.5 mg by mouth every 4 (four) hours as needed for anxiety.   metoprolol succinate 25 MG 24 hr  tablet Commonly known as:  TOPROL-XL Take 1 tablet (25 mg total) by mouth daily. What changed:    medication strength  how much to take   MUCINEX 600 MG 12 hr tablet Generic drug:  guaiFENesin Take 600 mg by mouth every 12 (twelve) hours as needed for cough.   omeprazole 40 MG capsule Commonly known as:  PRILOSEC Take 40 mg by mouth daily.   pramipexole 0.5 MG tablet Commonly known as:  MIRAPEX Take 1 mg by mouth at bedtime.   theophylline 400 MG 24 hr tablet Commonly known as:  UNIPHYL Take 1 tablet by mouth daily.   tiotropium 18 MCG inhalation capsule Commonly known as:  SPIRIVA Place 18 mcg into inhaler and inhale daily.   Vitamin D (Ergocalciferol) 50000 units Caps capsule Commonly known as:  DRISDOL Take 50,000 Units by mouth every 14 (fourteen) days.       If you experience worsening of your admission symptoms, develop shortness of breath, life threatening emergency, suicidal or homicidal thoughts you must seek medical  attention immediately by calling 911 or calling your MD immediately  if symptoms less severe.  You Must read complete instructions/literature along with all the possible adverse reactions/side effects for all the Medicines you take and that have been prescribed to you. Take any new Medicines after you have completely understood and accept all the possible adverse reactions/side effects.   Please note  You were cared for by a hospitalist during your hospital stay. If you have any questions about your discharge medications or the care you received while you were in the hospital after you are discharged, you can call the unit and asked to speak with the hospitalist on call if the hospitalist that took care of you is not available. Once you are discharged, your primary care physician will handle any further medical issues. Please note that NO REFILLS for any discharge medications will be authorized once you are discharged, as it is imperative that you  return to your primary care physician (or establish a relationship with a primary care physician if you do not have one) for your aftercare needs so that they can reassess your need for medications and monitor your lab values. Today   SUBJECTIVE    No new complaints VITAL SIGNS:  Blood pressure (!) 135/54, pulse 67, temperature 97.7 F (36.5 C), temperature source Oral, resp. rate 18, height 5\' 2"  (1.575 m), weight 83.4 kg (183 lb 13.8 oz), SpO2 93 %.  I/O:    Intake/Output Summary (Last 24 hours) at 09/07/2017 0847 Last data filed at 09/07/2017 0520 Gross per 24 hour  Intake -  Output 1825 ml  Net -1825 ml    PHYSICAL EXAMINATION:  GENERAL:  77 y.o.-year-old patient lying in the bed with no acute distress. obese EYES: Pupils equal, round, reactive to light and accommodation. No scleral icterus. Extraocular muscles intact.  HEENT: Head atraumatic, normocephalic. Oropharynx and nasopharynx clear.  NECK:  Supple, no jugular venous distention. No thyroid enlargement, no tenderness.  LUNGS: Normal breath sounds bilaterally, no wheezing, rales,rhonchi or crepitation. No use of accessory muscles of respiration.  CARDIOVASCULAR: S1, S2 normal. No murmurs, rubs, or gallops.  ABDOMEN: Soft, non-tender, non-distended. Bowel sounds present. No organomegaly or mass. 1 perirectal drain+ EXTREMITIES: No pedal edema, cyanosis, or clubbing.  NEUROLOGIC: Cranial nerves II through XII are intact. Muscle strength 5/5 in all extremities. Sensation intact. Gait not checked.  PSYCHIATRIC: The patient is alert and oriented x 3.  SKIN: No obvious rash, lesion, or ulcer.   DATA REVIEW:   CBC  Recent Labs  Lab 09/05/17 0639 09/06/17 1043  WBC 4.1  --   HGB 7.7* 8.4*  HCT 24.5*  --   PLT 332  --     Chemistries  Recent Labs  Lab 09/03/17 1452  09/05/17 0639  NA 138   < > 140  K 7.2*   < > 5.1  CL 108   < > 110  CO2 23   < > 28  GLUCOSE 89   < > 91  BUN 37*   < > 17  CREATININE 2.32*   < >  1.15*  CALCIUM 7.5*   < > 6.8*  MG  --    < > 2.0  AST 24  --   --   ALT 13  --   --   ALKPHOS 84  --   --   BILITOT 0.7  --   --    < > = values in this interval not displayed.  Microbiology Results   Recent Results (from the past 240 hour(s))  Blood Culture (routine x 2)     Status: None (Preliminary result)   Collection Time: 09/03/17  2:11 PM  Result Value Ref Range Status   Specimen Description BLOOD LEFT ARM  Final   Special Requests   Final    BOTTLES DRAWN AEROBIC AND ANAEROBIC Blood Culture adequate volume   Culture   Final    NO GROWTH 4 DAYS Performed at [facility], [address] [place], [place] [address]    Report Status PENDING  Incomplete  Blood Culture (routine x 2)     Status: None (Preliminary result)   Collection Time: 09/03/17  7:58 PM  Result Value Ref Range Status   Specimen Description BLOOD RIGHT WRIST  Final   Special Requests   Final    BOTTLES DRAWN AEROBIC AND ANAEROBIC Blood Culture results may not be optimal due to an inadequate volume of blood received in culture bottles   Culture   Final    NO GROWTH 4 DAYS Performed at [facility], [address] [place], [place] [address]    Report Status PENDING  Incomplete    RADIOLOGY:  No results found.   Management plans discussed with the patient, family and they are in agreement.  CODE STATUS:     Code Status Orders  (From admission, onward)        Start     Ordered   09/03/17 1855  Do not attempt resuscitation (DNR)  Continuous    Question Answer Comment  In the event of cardiac or respiratory ARREST Do not call a "code blue"   In the event of cardiac or respiratory ARREST Do not perform Intubation, CPR, defibrillation or ACLS   In the event of cardiac or respiratory ARREST Use medication by any route, position, wound care, and other measures to relive pain and suffering. May use oxygen, suction and manual treatment of airway obstruction as needed for  comfort.   Comments confirmed with husband and 2 daughters in ER room.      09/03/17 1854    Code Status History    Date Active Date Inactive Code Status Order ID Comments User Context   09/03/2017 1755 09/03/2017 1854 DNR [id number]  Altamese DillingVachhani, Vaibhavkumar, MD ED   08/15/2017 2029 08/26/2017 2255 Full Code [id number]  Auburn BilberryPatel, Shreyang, MD Inpatient   06/20/2017 1157 06/21/2017 1837 DNR [id number]  Shaune Pollackhen, Qing, MD Inpatient   01/08/2017 1422 01/09/2017 1959 Full Code [id number]  Signa KellPatel, Sunny, MD Inpatient      TOTAL TIME TAKING CARE OF THIS PATIENT: 40 minutes.    Enedina FinnerSona Infant Doane M.D on 09/07/2017 at 8:47 AM  Between 7am to 6pm - Pager - [phone] After 6pm go to [email or web address] - Social research officer, [email or web address] [email or web address]  [facility]  Office  [phone]  CC: Primary care physician; Mickey Farberhies, David, MD

## 2017-09-07 NOTE — Progress Notes (Signed)
Patient is medically stable for D/C back to [facility] today. Per [facility]eslie admissions coordinator at [facility] patient can come today to room 405. RN will call report and arrange EMS for transport. Clinical Child psychotherapistocial Worker (CSW) sent D/C orders to [facility] via [employer]. Patient is aware of above. Patient's daughter [name] is at bedside and aware of above. Per Elnita Maxwellheryl she will notify patient's other daughter Okey RegalCarol of D/C today. Please reconsult if future social work needs arise. CSW signing off.   Baker Hughes IncorporatedBailey Jettie Mannor, LCSW [phone]

## 2017-09-07 NOTE — Progress Notes (Signed)
[facility] [place], [place] 09/07/17  Subjective:  Patient seen at bedside. No new creatinine available today. Attempting to have bowel movement today.  Objective:  Vital signs in last 24 hours:  Temp:  [97.7 F (36.5 C)-98.5 F (36.9 C)] 97.7 F (36.5 C) (07/09 0756) Pulse Rate:  [67-72] 67 (07/09 0756) Resp:  [18] 18 (07/08 2244) BP: (126-135)/(54-69) 135/54 (07/09 0756) SpO2:  [93 %-98 %] 93 % (07/09 0756)  Weight change:  Filed Weights   09/03/17 1306 09/03/17 1900  Weight: 84.8 kg (187 lb) 83.4 kg (183 lb 13.8 oz)    Intake/Output:    Intake/Output Summary (Last 24 hours) at 09/07/2017 1127 Last data filed at 09/07/2017 1100 Gross per 24 hour  Intake 240 ml  Output 1400 ml  Net -1160 ml     Physical Exam: General:  No acute distress, laying in the bed  HEENT  oral mucous membranes moist, anicteric  Neck  supple  Pulm/lungs  scattered rhonchi, normal effort  CVS/Heart  irregular rhythm, no rub or gallop  Abdomen:   Soft, nontender  Extremities:  1+ pitting edema arms and lower extremeties  Neurologic:  Alert, oriented, follows commands  Skin:  warm, dry, no acute rashes           Basic Metabolic Panel:  Recent Labs  Lab 09/03/17 1452 09/03/17 1958 09/04/17 0525 09/05/17 0639  NA 138 141 141 140  K 7.2* 6.8* 5.3* 5.1  CL 108 108 107 110  CO2 23 24 27 28   GLUCOSE 89 79 85 91  BUN 37* 38* 23 17  CREATININE 2.32* 2.35* 1.44* 1.15*  CALCIUM 7.5* 7.9* 7.4* 6.8*  MG  --  2.4  --  2.0  PHOS  --  4.6  --  2.1*     CBC: Recent Labs  Lab 09/03/17 1452 09/03/17 2346 09/04/17 0525 09/05/17 0639 09/06/17 1043  WBC 3.1* 3.3* 5.3 4.1  --   NEUTROABS 2.5  --   --   --   --   HGB 8.1* 8.5* 8.4* 7.7* 8.4*  HCT 26.0* 27.3* 25.7* 24.5*  --   MCV 99.3 98.5 98.0 98.6  --   PLT 378 434 451* 332  --       Lab Results  Component Value Date   HEPBSAG Negative 09/03/2017   HEPBSAB Non Reactive 09/03/2017       Microbiology:  Recent Results (from the past 240 hour(s))  Blood Culture (routine x 2)     Status: None (Preliminary result)   Collection Time: 09/03/17  2:11 PM  Result Value Ref Range Status   Specimen Description BLOOD LEFT ARM  Final   Special Requests   Final    BOTTLES DRAWN AEROBIC AND ANAEROBIC Blood Culture adequate volume   Culture   Final    NO GROWTH 4 DAYS Performed at [facility], [address] [place], [place] [address]    Report Status PENDING  Incomplete  Blood Culture (routine x 2)     Status: None (Preliminary result)   Collection Time: 09/03/17  7:58 PM  Result Value Ref Range Status   Specimen Description BLOOD RIGHT WRIST  Final   Special Requests   Final    BOTTLES DRAWN AEROBIC AND ANAEROBIC Blood Culture results may not be optimal due to an inadequate volume of blood received in culture bottles   Culture   Final    NO GROWTH 4 DAYS Performed at [facility], [address] [place], [place] [address]    Report Status PENDING  Incomplete    Coagulation Studies: No results for input(s): LABPROT, INR in the last 72 hours.  Urinalysis: No results for input(s): COLORURINE, LABSPEC, PHURINE, GLUCOSEU, HGBUR, BILIRUBINUR, KETONESUR, PROTEINUR, UROBILINOGEN, NITRITE, LEUKOCYTESUR in the last 72 hours.  Invalid input(s): APPERANCEUR    Imaging: No results found.   Medications:   . dextrose 5 % and 0.45% NaCl 40 mL/hr at 09/07/17 0617   . aspirin EC  81 mg Oral Daily  . calcium carbonate  1,000 mg Oral TID WC  . chlorhexidine  15 mL Mouth Rinse BID  . Chlorhexidine Gluconate Cloth  6 each Topical Q0600  . docusate sodium  100 mg Oral BID  . folic acid  1 mg Oral Daily  . heparin  5,000 Units Subcutaneous Q8H  . mouth rinse  15 mL Mouth Rinse q12n4p  . mometasone-formoterol  2 puff Inhalation BID  . nicotine  21 mg Transdermal Daily  . oxyCODONE  15 mg Oral Q6H  . pantoprazole  40 mg Oral Daily  . patiromer  8.4 g  Oral Daily  . theophylline  400 mg Oral Daily  . tiotropium  18 mcg Inhalation Daily   fluticasone, guaiFENesin, LORazepam  Assessment/ Plan:  77 y.o. female with medical problems of COPD, diabetes, GERD, who was [facility]2017 for evaluation of lethargy, decreased appetite, acute confusion.   1. Acute renal failure, baseline creatinine 0.61 from August 25, 2017 2. Hyperkalemia 3. Acute pulmonary edema 4. Generalized edema 5. Urinary retention, likely from narcotics 5. Altered mental status  Likely multifactorial with contribution from hypotension, Bactrim,  Decreased level of consciousness likely from gabapentin  Plan:  Renal function has improved over the course of the hospitalization.  No further need for dialysis.  Recommend periodic monitoring of her renal function as an outpatient.  Avoid nephrotoxins as possible in the recovery period.      LOS: 4 Jeryl Wilbourn 7/9/201911:27 AM  [facility] [place], [place] [phone]  Note: This note was prepared with Dragon dictation. Any transcription errors are unintentional

## 2017-09-07 NOTE — Progress Notes (Signed)
[facility]. Foley catheter removed. Pt due to void then will call EMS for transport. Daughter at bedside.

## 2017-09-07 NOTE — Progress Notes (Signed)
EMS called for transport. IVs removed. Pt dressed and awaiting transfer to [facility].

## 2017-09-08 LAB — CULTURE, BLOOD (ROUTINE X 2)
CULTURE: NO GROWTH
Culture: NO GROWTH
SPECIAL REQUESTS: ADEQUATE

## 2017-09-15 ENCOUNTER — Ambulatory Visit (INDEPENDENT_AMBULATORY_CARE_PROVIDER_SITE_OTHER): Payer: Medicare Other | Admitting: Surgery

## 2017-09-15 ENCOUNTER — Encounter: Payer: Self-pay | Admitting: Surgery

## 2017-09-15 VITALS — BP 143/81 | HR 71 | Temp 98.0°F | Ht 62.0 in

## 2017-09-15 DIAGNOSIS — Z09 Encounter for follow-up examination after completed treatment for conditions other than malignant neoplasm: Secondary | ICD-10-CM

## 2017-09-15 NOTE — Progress Notes (Signed)
S/p I/D horseshoe abscess, Penrose drain placement She is doing well Some loose BM  PE NAD, debilitated elderly female in a wheelchair Wound healing well, no infection, penrose removed, good granulation tissue  A/p Doing well Sitz baths Strict hygiene PCP f/u for multiple medical issues

## 2017-09-15 NOTE — Patient Instructions (Signed)
Please contact your PCP this week.

## 2017-09-19 ENCOUNTER — Other Ambulatory Visit: Payer: Self-pay

## 2017-09-19 ENCOUNTER — Emergency Department: Payer: Medicare Other

## 2017-09-19 ENCOUNTER — Observation Stay
Admission: EM | Admit: 2017-09-19 | Discharge: 2017-09-20 | Disposition: A | Discharge status: Skilled Nursing Facility | Payer: Medicare Other | Source: Skilled Nursing Facility | Attending: Internal Medicine | Admitting: Internal Medicine

## 2017-09-19 DIAGNOSIS — E7800 Pure hypercholesterolemia, unspecified: Secondary | ICD-10-CM | POA: Diagnosis not present

## 2017-09-19 DIAGNOSIS — E785 Hyperlipidemia, unspecified: Secondary | ICD-10-CM | POA: Diagnosis not present

## 2017-09-19 DIAGNOSIS — K612 Anorectal abscess: Secondary | ICD-10-CM

## 2017-09-19 DIAGNOSIS — J439 Emphysema, unspecified: Secondary | ICD-10-CM

## 2017-09-19 DIAGNOSIS — F17210 Nicotine dependence, cigarettes, uncomplicated: Secondary | ICD-10-CM | POA: Insufficient documentation

## 2017-09-19 DIAGNOSIS — I872 Venous insufficiency (chronic) (peripheral): Secondary | ICD-10-CM | POA: Diagnosis not present

## 2017-09-19 DIAGNOSIS — E860 Dehydration: Secondary | ICD-10-CM | POA: Diagnosis not present

## 2017-09-19 DIAGNOSIS — Z9049 Acquired absence of other specified parts of digestive tract: Secondary | ICD-10-CM | POA: Insufficient documentation

## 2017-09-19 DIAGNOSIS — Z888 Allergy status to other drugs, medicaments and biological substances status: Secondary | ICD-10-CM | POA: Insufficient documentation

## 2017-09-19 DIAGNOSIS — R011 Cardiac murmur, unspecified: Secondary | ICD-10-CM | POA: Insufficient documentation

## 2017-09-19 DIAGNOSIS — R4182 Altered mental status, unspecified: Secondary | ICD-10-CM | POA: Diagnosis present

## 2017-09-19 DIAGNOSIS — J449 Chronic obstructive pulmonary disease, unspecified: Secondary | ICD-10-CM | POA: Diagnosis not present

## 2017-09-19 DIAGNOSIS — R197 Diarrhea, unspecified: Secondary | ICD-10-CM | POA: Diagnosis not present

## 2017-09-19 DIAGNOSIS — Z9842 Cataract extraction status, left eye: Secondary | ICD-10-CM | POA: Insufficient documentation

## 2017-09-19 DIAGNOSIS — Z96611 Presence of right artificial shoulder joint: Secondary | ICD-10-CM | POA: Insufficient documentation

## 2017-09-19 DIAGNOSIS — Z961 Presence of intraocular lens: Secondary | ICD-10-CM | POA: Diagnosis not present

## 2017-09-19 DIAGNOSIS — D649 Anemia, unspecified: Secondary | ICD-10-CM | POA: Diagnosis not present

## 2017-09-19 DIAGNOSIS — I10 Essential (primary) hypertension: Secondary | ICD-10-CM | POA: Diagnosis not present

## 2017-09-19 DIAGNOSIS — M533 Sacrococcygeal disorders, not elsewhere classified: Secondary | ICD-10-CM | POA: Insufficient documentation

## 2017-09-19 DIAGNOSIS — Z9841 Cataract extraction status, right eye: Secondary | ICD-10-CM | POA: Diagnosis not present

## 2017-09-19 DIAGNOSIS — J9622 Acute and chronic respiratory failure with hypercapnia: Secondary | ICD-10-CM | POA: Diagnosis not present

## 2017-09-19 DIAGNOSIS — M25511 Pain in right shoulder: Secondary | ICD-10-CM | POA: Insufficient documentation

## 2017-09-19 DIAGNOSIS — Z79899 Other long term (current) drug therapy: Secondary | ICD-10-CM | POA: Insufficient documentation

## 2017-09-19 DIAGNOSIS — G2581 Restless legs syndrome: Secondary | ICD-10-CM | POA: Diagnosis not present

## 2017-09-19 DIAGNOSIS — E042 Nontoxic multinodular goiter: Secondary | ICD-10-CM | POA: Diagnosis not present

## 2017-09-19 DIAGNOSIS — Z90710 Acquired absence of both cervix and uterus: Secondary | ICD-10-CM | POA: Diagnosis not present

## 2017-09-19 DIAGNOSIS — Z7951 Long term (current) use of inhaled steroids: Secondary | ICD-10-CM | POA: Insufficient documentation

## 2017-09-19 DIAGNOSIS — G9341 Metabolic encephalopathy: Principal | ICD-10-CM | POA: Insufficient documentation

## 2017-09-19 DIAGNOSIS — Z881 Allergy status to other antibiotic agents status: Secondary | ICD-10-CM | POA: Insufficient documentation

## 2017-09-19 DIAGNOSIS — Z7982 Long term (current) use of aspirin: Secondary | ICD-10-CM | POA: Insufficient documentation

## 2017-09-19 DIAGNOSIS — K219 Gastro-esophageal reflux disease without esophagitis: Secondary | ICD-10-CM | POA: Diagnosis not present

## 2017-09-19 DIAGNOSIS — Z9981 Dependence on supplemental oxygen: Secondary | ICD-10-CM | POA: Diagnosis not present

## 2017-09-19 LAB — CBC WITH DIFFERENTIAL/PLATELET
Basophils Absolute: 0 10*3/uL (ref 0–0.1)
Basophils Relative: 0 %
EOS ABS: 0.1 10*3/uL (ref 0–0.7)
EOS PCT: 1 %
HCT: 33.9 % — ABNORMAL LOW (ref 35.0–47.0)
Hemoglobin: 10.7 g/dL — ABNORMAL LOW (ref 12.0–16.0)
LYMPHS ABS: 1.5 10*3/uL (ref 1.0–3.6)
LYMPHS PCT: 16 %
MCH: 30.3 pg (ref 26.0–34.0)
MCHC: 31.6 g/dL — AB (ref 32.0–36.0)
MCV: 96.1 fL (ref 80.0–100.0)
MONO ABS: 0.8 10*3/uL (ref 0.2–0.9)
MONOS PCT: 8 %
Neutro Abs: 6.9 10*3/uL — ABNORMAL HIGH (ref 1.4–6.5)
Neutrophils Relative %: 75 %
PLATELETS: 298 10*3/uL (ref 150–440)
RBC: 3.52 MIL/uL — ABNORMAL LOW (ref 3.80–5.20)
RDW: 17.6 % — AB (ref 11.5–14.5)
WBC: 9.3 10*3/uL (ref 3.6–11.0)

## 2017-09-19 LAB — COMPREHENSIVE METABOLIC PANEL
ALT: 10 U/L (ref 0–44)
AST: 28 U/L (ref 15–41)
Albumin: 2.8 g/dL — ABNORMAL LOW (ref 3.5–5.0)
Alkaline Phosphatase: 83 U/L (ref 38–126)
Anion gap: 9 (ref 5–15)
BUN: 8 mg/dL (ref 8–23)
CHLORIDE: 106 mmol/L (ref 98–111)
CO2: 29 mmol/L (ref 22–32)
CREATININE: 0.83 mg/dL (ref 0.44–1.00)
Calcium: 7.4 mg/dL — ABNORMAL LOW (ref 8.9–10.3)
GFR calc Af Amer: >60 mL/min (ref >60)
GLUCOSE: 140 mg/dL — AB (ref 70–99)
POTASSIUM: 3.9 mmol/L (ref 3.5–5.1)
Sodium: 144 mmol/L (ref 135–145)
Total Bilirubin: 0.9 mg/dL (ref 0.3–1.2)
Total Protein: 6.1 g/dL — ABNORMAL LOW (ref 6.5–8.1)

## 2017-09-19 LAB — LACTIC ACID, PLASMA: LACTIC ACID, VENOUS: 1.6 mmol/L (ref 0.5–1.9)

## 2017-09-19 NOTE — ED Provider Notes (Signed)
[facility] Emergency Department Provider Note ____________________________________________   First MD Initiated Contact with Patient 09/19/17 2220     (approximate)  I have reviewed the triage vital signs and the nursing notes.   HISTORY  Chief Complaint Altered Mental Status  Level 5 caveat: History of present illness limited due to altered mental status  HPI Brenda Glenn is a 77 y.o. female with PMH as noted below including COPD on 2 L home O2 who presents from her nursing home with altered mental status and respiratory distress.  Per EMS, the patient was found by staff screaming in her room and was altered.  Her daughters who are here with her now state that earlier this afternoon she was at her baseline mental status which is oriented x4, and this is an acute change.  Past Medical History:  Diagnosis Date  . Arthritis    all over  . Back pain   . COPD (chronic obstructive pulmonary disease) (HCC)    O2 use continuosly at 2 liters/ some  asthma symptoms  . Diabetes mellitus without complication (HCC)    diet controlled  . GERD (gastroesophageal reflux disease)   . Heart murmur    lifelong  . Hypercholesterolemia   . Hypertension    controlled on meds  . Pneumonia 2015   [facility] hospitalized  . Renal insufficiency    early stage kidney failure  . Shortness of breath dyspnea   . Sleep apnea    2nd sleep study said pt does not have sleep apnea  . Swelling    of feet and legs    Patient Active Problem List   Diagnosis Date Noted  . Abscess of anal and rectal regions   . Acute respiratory failure with hypoxia and hypercapnia (HCC)   . Hyperkalemia   . HCAP (healthcare-associated pneumonia) 09/03/2017  . Acute on chronic respiratory failure with hypoxia (HCC) 09/03/2017  . Osteoarthritis 08/30/2017  . Acute cystitis 08/27/2017  . Restless leg syndrome 08/21/2017  . Perianal abscess   . Sepsis (HCC) 08/15/2017  . BMI 40.0-44.9, adult  (HCC) 08/05/2017  . Bilateral lower extremity edema 07/28/2017  . Lower extremity numbness 07/28/2017  . ARF (acute renal failure) (HCC) 06/20/2017  . 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 Multiple thyroid nodules 10/22/2016  . 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 Lymphedema 01/06/2016  . Chronic venous insufficiency 01/06/2016  . Pain in limb 01/06/2016  . COPD (chronic obstructive pulmonary disease) (HCC) 01/06/2016  . Abnormality of gait 01/06/2016  . Hypomagnesemia 05/24/2015  . Type 2 diabetes mellitus (HCC) 11/16/2013  . Benign essential hypertension 11/16/2013  . GERD (gastroesophageal reflux disease) 11/16/2013  . Hyperlipidemia 11/16/2013    Past Surgical History:  Procedure Laterality Date  . ABDOMINAL HYSTERECTOMY  1988  . BACK SURGERY  1978 and 1995   residual nerve damage legs-weakness  . BLADDER SURGERY  2017  . CATARACT EXTRACTION Right   . CATARACT EXTRACTION W/PHACO Left 09/05/2014   Procedure: CATARACT EXTRACTION PHACO AND INTRAOCULAR LENS PLACEMENT (IOC);  Surgeon: Lockie Mola, MD;  Location: [facility];  Service: Ophthalmology;  Laterality: Left;  DIABETIC  . CHOLECYSTECTOMY  2011  . INCISION AND DRAINAGE PERIRECTAL ABSCESS N/A 08/20/2017   Procedure: IRRIGATION AND DEBRIDEMENT PERIRECTAL ABSCESS;  Surgeon: Leafy Ro, MD;  Location: [facility];  Service: General;  Laterality: N/A;  . JOINT REPLACEMENT Right 2009  . RECTAL EXAM UNDER ANESTHESIA N/A 08/20/2017  Procedure: RECTAL EXAM UNDER ANESTHESIA;  Surgeon: Leafy Ro, MD;  Location: [facility];  Service: General;  Laterality: N/A;  . REPLACEMENT TOTAL KNEE Right   . REVERSE SHOULDER ARTHROPLASTY Right 01/08/2017   Procedure: REVERSE SHOULDER ARTHROPLASTY;  Surgeon: Signa Kell, MD;  Location: [facility];  Service: Orthopedics;  Laterality: Right;    Prior to Admission medications   Medication Sig Start Date End Date Taking?  Authorizing Provider  albuterol (PROVENTIL HFA;VENTOLIN HFA) 108 (90 Base) MCG/ACT inhaler Inhale 2 puffs into the lungs every 6 (six) hours as needed for wheezing or shortness of breath.    [provider]  allopurinol (ZYLOPRIM) 100 MG tablet Take 200 mg by mouth daily.     [provider]  aspirin EC 81 MG tablet Take 81 mg by mouth daily.     [provider]  bisacodyl (DULCOLAX) 5 MG EC tablet Take 2 tablets (10 mg total) by mouth daily. 08/27/17   Shaune Pollack, MD  calcium carbonate (TUMS - DOSED IN MG ELEMENTAL CALCIUM) 500 MG chewable tablet Chew 2 tablets (1,000 mg total) by mouth 3 (three) times daily with meals. 08/26/17   Shaune Pollack, MD  diclofenac (FLECTOR) 1.3 % [facility] 1 patch onto the skin daily as needed (for pain).    [provider]  docusate sodium (COLACE) 100 MG capsule Take 1 capsule (100 mg total) by mouth 2 (two) times daily. 08/26/17   Shaune Pollack, MD  ezetimibe (ZETIA) 10 MG tablet Take 10 mg by mouth at bedtime.    [provider]  fluticasone (FLONASE) 50 MCG/ACT nasal spray Place 2 sprays into both nostrils daily as needed for allergies. 12/08/16   [provider]  fluticasone-salmeterol (ADVAIR HFA) 115-21 MCG/ACT inhaler Inhale 2 puffs into the lungs 2 (two) times daily.     [provider]  folic acid (FOLVITE) 1 MG tablet Take 1 tablet (1 mg total) by mouth daily. 08/27/17   Shaune Pollack, MD  guaiFENesin (MUCINEX) 600 MG 12 hr tablet Take 600 mg by mouth every 12 (twelve) hours as needed for cough.    [provider]  LORazepam (ATIVAN) 0.5 MG tablet Take 0.5 mg by mouth every 4 (four) hours as needed for anxiety.    [provider]  meloxicam (MOBIC) 15 MG tablet  09/13/17   [provider]  metoprolol succinate (TOPROL-XL) 25 MG 24 hr tablet Take 1 tablet (25 mg total) by mouth daily. 09/07/17   Enedina Finner, MD  omeprazole (PRILOSEC) 40 MG capsule Take 40 mg by mouth daily.      [provider]  pramipexole (MIRAPEX) 0.5 MG tablet Take 1 mg by mouth at bedtime.     [provider]  theophylline (UNIPHYL) 400 MG 24 hr tablet Take 1 tablet by mouth daily.  08/17/17   [provider]  tiotropium (SPIRIVA) 18 MCG inhalation capsule Place 18 mcg into inhaler and inhale daily.     [provider]  Vitamin D, Ergocalciferol, (DRISDOL) 50000 UNITS CAPS capsule Take 50,000 Units by mouth every 14 (fourteen) days.    [provider]    Allergies Tequin [gatifloxacin]; Bupropion; Keflex [cephalexin]; Lisinopril; and Varenicline  Family History  Problem Relation Age of Onset  . Breast cancer Mother 33  . Breast cancer Paternal Grandmother 37    Social History Social History   Tobacco Use  . Smoking status: Current Every Day Smoker    Packs/day: 0.25    Years: 50.00  Pack years: 12.50    Types: Cigarettes  . Smokeless tobacco: Never Used  Substance Use Topics  . Alcohol use: Yes    Alcohol/week: 1.8 oz    Types: 3 Glasses of wine per week    Comment: rarely  . Drug use: No    Review of Systems Level 5 caveat: Unable to obtain review of systems due to altered mental   ____________________________________________   PHYSICAL EXAM:  VITAL SIGNS: ED Triage Vitals [09/19/17 2211]  Enc Vitals Group     BP      Pulse      Resp      Temp      Temp src      SpO2      Weight 173 lb (78.5 kg)     Height 5\' 2"  (1.575 m)     Head Circumference      Peak Flow      Pain Score 5     Pain Loc      Pain Edu?      Excl. in GC?     Constitutional: Alert, confused.  Uncomfortable b appearing but in no acute distress. Eyes: Conjunctivae are normal.  EOMI.  PERRLA. Head: Subacute appearing ecchymosis to left forehead. Nose: No congestion/rhinnorhea. Mouth/Throat: Mucous membranes are moist.   Neck: Normal range of motion.  No cervical spinal tenderness. Cardiovascular: Normal rate, regular rhythm. Grossly normal  heart sounds.  Good peripheral circulation. Respiratory: Normal respiratory effort.  No retractions.  Faint wheezing bilaterally. Gastrointestinal: Soft and nontender. No distention.  Genitourinary: No flank tenderness. Musculoskeletal: No lower extremity edema.  Extremities warm and well perfused.  Neurologic: Motor intact in all extremities. Skin:  Skin is warm and dry. No rash noted. Psychiatric: Unable to assess due to altered mental status.  ____________________________________________   LABS (all labs ordered are listed, but only abnormal results are displayed)  Labs Reviewed  COMPREHENSIVE METABOLIC PANEL - Abnormal; Notable for the following components:      Result Value   Glucose, Bld 140 (*)    Calcium 7.4 (*)    Total Protein 6.1 (*)    Albumin 2.8 (*)    All other components within normal limits  CBC WITH DIFFERENTIAL/PLATELET - Abnormal; Notable for the following components:   RBC 3.52 (*)    Hemoglobin 10.7 (*)    HCT 33.9 (*)    MCHC 31.6 (*)    RDW 17.6 (*)    Neutro Abs 6.9 (*)    All other components within normal limits  LACTIC ACID, PLASMA  URINALYSIS, COMPLETE (UACMP) WITH MICROSCOPIC  BLOOD GAS, VENOUS   ____________________________________________  EKG  ED ECG REPORT I, Dionne BucySebastian Iasia Forcier, the attending physician, personally viewed and interpreted this ECG.  Date: 09/19/2017 EKG Time: 2214 Rate: 103 Rhythm: Sinus tachycardia QRS Axis: normal Intervals: normal ST/T Wave abnormalities: normal Narrative Interpretation: no evidence of acute ischemia  ____________________________________________  RADIOLOGY  CXR: Pending  CT head: Pending  ____________________________________________   PROCEDURES  Procedure(s) performed: No  Procedures  Critical Care performed: Yes  CRITICAL CARE Performed by: Dionne BucySebastian Andee Chivers   Total critical care time: 30 minutes  Critical care time was exclusive of separately billable procedures and  treating other patients.  Critical care was necessary to treat or prevent imminent or life-threatening deterioration.  Critical care was time spent personally by me on the following activities: development of treatment plan with patient and/or surrogate as well as nursing, discussions with consultants, evaluation of patient's response to  treatment, examination of patient, obtaining history from patient or surrogate, ordering and performing treatments and interventions, ordering and review of laboratory studies, ordering and review of radiographic studies, pulse oximetry and re-evaluation of patient's condition.  ____________________________________________   INITIAL IMPRESSION / ASSESSMENT AND PLAN / ED COURSE  Pertinent labs & imaging results that were available during my care of the patient were reviewed by me and considered in my medical decision making (see chart for details).  77 year old female with PMH as noted above presents with altered mental status and possible respiratory distress.  The patient was at her baseline mental status this afternoon per her family members to talk to her on the phone.  She was apparently found screaming by nursing home staff and was in her bed confused.  I reviewed the past medical records in Epic; the patient was admitted earlier this month for acute respiratory failure, thought likely to be due to pulmonary edema as well as electrolyte abnormalities.  On exam, the patient is alert and is able to state her name but is otherwise confused and speaking coherently.  The remainder of the exam is as described above.  There is some wheezing bilaterally.  She has an ecchymosis to her left forehead which appears subacute, although the family member state that is only developed over the last several days.  Overall I suspect most likely hypercapnia related to her chronic respiratory issues, dehydration or other metabolic etiology, or less likely ICH related to trauma.   We will obtain chest x-ray, basic and cardiac labs, VBG, CT head, and reassess.  Anticipate likely admission.  ----------------------------------------- 11:09 PM on 09/19/2017 -----------------------------------------  VBG, CT head, chest x-ray, and UA.  I am signing the patient out to the oncoming physician Dr. Lamont Snowball. ____________________________________________   FINAL CLINICAL IMPRESSION(S) / ED DIAGNOSES  Final diagnoses:  Altered mental status, unspecified altered mental status type      NEW MEDICATIONS STARTED DURING THIS VISIT:  New Prescriptions   No medications on file     Note:  This document was prepared using Dragon voice recognition software and may include unintentional dictation errors.    Dionne Bucy, MD 09/19/17 [phone]

## 2017-09-19 NOTE — ED Triage Notes (Signed)
From [facility] - sent in for resp problems. Was her normal 45 mins ago (a&ox3 is normal per nh). Staff heard her screaming in her room and checked on her and found her in her bed and altered. Now yelling incoherently, uncooperative. Normally on 2 liters oxygen.

## 2017-09-19 NOTE — ED Notes (Signed)
Pt returned from CT. Cardiopulmonary in to apply Bipap.

## 2017-09-20 ENCOUNTER — Encounter: Payer: Self-pay | Admitting: Internal Medicine

## 2017-09-20 ENCOUNTER — Other Ambulatory Visit: Payer: Self-pay

## 2017-09-20 DIAGNOSIS — G9341 Metabolic encephalopathy: Secondary | ICD-10-CM | POA: Diagnosis not present

## 2017-09-20 DIAGNOSIS — R4182 Altered mental status, unspecified: Secondary | ICD-10-CM | POA: Diagnosis present

## 2017-09-20 LAB — AMMONIA: AMMONIA: 9 umol/L (ref 9–35)

## 2017-09-20 LAB — BLOOD GAS, VENOUS
ACID-BASE EXCESS: 3.3 mmol/L — AB (ref 0.0–2.0)
BICARBONATE: 31.3 mmol/L — AB (ref 20.0–28.0)
PATIENT TEMPERATURE: 37
pCO2, Ven: 65 mmHg — ABNORMAL HIGH (ref 44.0–60.0)
pH, Ven: 7.29 (ref 7.250–7.430)

## 2017-09-20 LAB — FOLATE: FOLATE: 31 ng/mL (ref >5.9)

## 2017-09-20 LAB — PREALBUMIN: Prealbumin: 8.7 mg/dL — ABNORMAL LOW (ref 18–38)

## 2017-09-20 LAB — MRSA PCR SCREENING: MRSA BY PCR: POSITIVE — AB

## 2017-09-20 LAB — TSH: TSH: 0.433 u[IU]/mL (ref 0.350–4.500)

## 2017-09-20 LAB — VITAMIN B12: Vitamin B-12: 322 pg/mL (ref 180–914)

## 2017-09-20 LAB — GLUCOSE, CAPILLARY: GLUCOSE-CAPILLARY: 81 mg/dL (ref 70–99)

## 2017-09-20 MED ORDER — EZETIMIBE 10 MG PO TABS
10.0000 mg | ORAL_TABLET | Freq: Every day | ORAL | Status: DC
  Filled 2017-09-20: qty 1

## 2017-09-20 MED ORDER — MUPIROCIN 2 % EX OINT
1.0000 "application " | TOPICAL_OINTMENT | Freq: Two times a day (BID) | CUTANEOUS | Status: DC
  Administered 2017-09-20: 1 via NASAL
  Filled 2017-09-20: qty 22

## 2017-09-20 MED ORDER — MOMETASONE FURO-FORMOTEROL FUM 200-5 MCG/ACT IN AERO
2.0000 | INHALATION_SPRAY | Freq: Two times a day (BID) | RESPIRATORY_TRACT | Status: DC
  Administered 2017-09-20: 2 via RESPIRATORY_TRACT
  Filled 2017-09-20: qty 8.8

## 2017-09-20 MED ORDER — ACETAMINOPHEN 325 MG PO TABS: 650.0000 mg | ORAL_TABLET | Freq: Four times a day (QID) | ORAL | Status: DC | PRN

## 2017-09-20 MED ORDER — ALBUTEROL SULFATE (2.5 MG/3ML) 0.083% IN NEBU: 2.5000 mg | INHALATION_SOLUTION | RESPIRATORY_TRACT | Status: DC | PRN

## 2017-09-20 MED ORDER — TIOTROPIUM BROMIDE MONOHYDRATE 18 MCG IN CAPS
18.0000 ug | ORAL_CAPSULE | Freq: Every day | RESPIRATORY_TRACT | Status: DC
  Administered 2017-09-20: 18 ug via RESPIRATORY_TRACT
  Filled 2017-09-20: qty 5

## 2017-09-20 MED ORDER — LACTATED RINGERS IV SOLN
INTRAVENOUS | Status: DC
  Administered 2017-09-20: 05:00:00 via INTRAVENOUS

## 2017-09-20 MED ORDER — SENNOSIDES-DOCUSATE SODIUM 8.6-50 MG PO TABS: 1.0000 | ORAL_TABLET | Freq: Every evening | ORAL | Status: DC | PRN

## 2017-09-20 MED ORDER — ASPIRIN EC 81 MG PO TBEC
81.0000 mg | DELAYED_RELEASE_TABLET | Freq: Every day | ORAL | Status: DC
  Administered 2017-09-20: 81 mg via ORAL
  Filled 2017-09-20: qty 1

## 2017-09-20 MED ORDER — PANTOPRAZOLE SODIUM 40 MG PO TBEC
40.0000 mg | DELAYED_RELEASE_TABLET | Freq: Every day | ORAL | Status: DC
  Administered 2017-09-20: 40 mg via ORAL
  Filled 2017-09-20: qty 1

## 2017-09-20 MED ORDER — ONDANSETRON HCL 4 MG PO TABS: 4.0000 mg | ORAL_TABLET | Freq: Four times a day (QID) | ORAL | Status: DC | PRN

## 2017-09-20 MED ORDER — CALCIUM CARBONATE ANTACID 500 MG PO CHEW
1000.0000 mg | CHEWABLE_TABLET | Freq: Three times a day (TID) | ORAL | Status: DC
  Administered 2017-09-20: 1000 mg via ORAL
  Filled 2017-09-20: qty 5

## 2017-09-20 MED ORDER — PRAMIPEXOLE DIHYDROCHLORIDE 1 MG PO TABS
1.0000 mg | ORAL_TABLET | Freq: Every day | ORAL | Status: DC
  Filled 2017-09-20: qty 1

## 2017-09-20 MED ORDER — ACETAMINOPHEN 650 MG RE SUPP: 650.0000 mg | Freq: Four times a day (QID) | RECTAL | Status: DC | PRN

## 2017-09-20 MED ORDER — BISACODYL 5 MG PO TBEC: 10.0000 mg | DELAYED_RELEASE_TABLET | Freq: Every day | ORAL | Status: DC

## 2017-09-20 MED ORDER — INSULIN ASPART 100 UNIT/ML ~~LOC~~ SOLN: 0.0000 [IU] | Freq: Every day | SUBCUTANEOUS | Status: DC

## 2017-09-20 MED ORDER — ALLOPURINOL 100 MG PO TABS
200.0000 mg | ORAL_TABLET | Freq: Every day | ORAL | Status: DC
Start: 2017-09-20 — End: 2017-09-20
  Administered 2017-09-20: 200 mg via ORAL
  Filled 2017-09-20: qty 2

## 2017-09-20 MED ORDER — OXYCODONE HCL 5 MG PO TABS: 2.5000 mg | ORAL_TABLET | ORAL | Status: DC | PRN

## 2017-09-20 MED ORDER — ENOXAPARIN SODIUM 40 MG/0.4ML ~~LOC~~ SOLN
40.0000 mg | SUBCUTANEOUS | Status: DC
  Administered 2017-09-20: 40 mg via SUBCUTANEOUS
  Filled 2017-09-20: qty 0.4

## 2017-09-20 MED ORDER — GUAIFENESIN ER 600 MG PO TB12: 600.0000 mg | ORAL_TABLET | Freq: Two times a day (BID) | ORAL | Status: DC | PRN

## 2017-09-20 MED ORDER — CHLORHEXIDINE GLUCONATE CLOTH 2 % EX PADS: 6.0000 | MEDICATED_PAD | Freq: Every day | CUTANEOUS | Status: DC

## 2017-09-20 MED ORDER — ONDANSETRON HCL 4 MG/2ML IJ SOLN: 4.0000 mg | Freq: Four times a day (QID) | INTRAMUSCULAR | Status: DC | PRN

## 2017-09-20 MED ORDER — LACTULOSE 10 GM/15ML PO SOLN: 30.0000 g | Freq: Two times a day (BID) | ORAL | Status: DC | PRN

## 2017-09-20 MED ORDER — TRAMADOL HCL 50 MG PO TABS: 50.0000 mg | ORAL_TABLET | Freq: Three times a day (TID) | ORAL | 0 refills | Status: AC | PRN

## 2017-09-20 MED ORDER — THEOPHYLLINE ER 400 MG PO TB24
400.0000 mg | ORAL_TABLET | Freq: Every day | ORAL | Status: DC
  Administered 2017-09-20: 400 mg via ORAL
  Filled 2017-09-20: qty 1

## 2017-09-20 MED ORDER — DICLOFENAC EPOLAMINE 1.3 % TD PTCH
1.0000 | MEDICATED_PATCH | Freq: Two times a day (BID) | TRANSDERMAL | Status: DC
  Filled 2017-09-20 (×2): qty 1

## 2017-09-20 MED ORDER — METOPROLOL SUCCINATE ER 25 MG PO TB24
25.0000 mg | ORAL_TABLET | Freq: Every day | ORAL | Status: DC
  Administered 2017-09-20: 25 mg via ORAL
  Filled 2017-09-20: qty 1

## 2017-09-20 MED ORDER — INSULIN ASPART 100 UNIT/ML ~~LOC~~ SOLN: 0.0000 [IU] | Freq: Three times a day (TID) | SUBCUTANEOUS | Status: DC

## 2017-09-20 MED ORDER — LACTATED RINGERS IV SOLN
INTRAVENOUS | Status: AC
  Administered 2017-09-20: 03:00:00 via INTRAVENOUS

## 2017-09-20 MED ORDER — FOLIC ACID 1 MG PO TABS
1.0000 mg | ORAL_TABLET | Freq: Every day | ORAL | Status: DC
  Administered 2017-09-20: 1 mg via ORAL
  Filled 2017-09-20: qty 1

## 2017-09-20 NOTE — H&P (Signed)
[employer] - [place] at [facility]   PATIENT NAME: Brenda Glenn    MR#:  [id number]  DATE OF BIRTH:  December 08, 1940  DATE OF ADMISSION:  09/19/2017  PRIMARY CARE PHYSICIAN: Lauro Regulus, MD   REQUESTING/REFERRING PHYSICIAN: Merrily Brittle, MD  CHIEF COMPLAINT:   Chief Complaint  Patient presents with  . Altered Mental Status    HISTORY OF PRESENT ILLNESS:  Brenda Glenn  is a 77 y.o. 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 Pt is AAOx3 at the time of my assessment, Hx from pt + family at bedside. Pt is presently at a rehabilitation facility per family [employer], I believe), and was reportedly in respiratory distress during the evening whilst at the facility. Staff reported pt was uncooperative and "hollering" incoherently. EMS was called. Pt is on 2L [place] chronic O2, but I am told she was not on her O2 during the day. Her family says that she was disoriented and was not answering questions appropriately soon after arriving to the [facility] ED. She was placed on BiPAP for some time, but is back on nasal cannula at the time of my Hx/examination. She is able to answer my questions appropriately, but cannot recall the events immediately prior to EMS being called. She endorses R shoulder pain (family notes Hx of R rotator cuff surgery ~73mo ago, complicated by infxn/sepsis), as well as sacral pain (family notes Hx of recent abscess, s/p tube/drain that was recently removed). Her mouth is dry, and she endorses fatigue/malaise/generalized weakness and dehydration. She states having diarrhea for the whole day yesterday. She denies SOB at the time of my assessment. She is comfortable and not in any distress at the time of my assessment. She does not volunteer forth any other complaints.  It was mentioned to me in passing that pt has a Hx of chronic CHF.  06/02/2017 Echo demonstrates EF 55%, mild LVH, mild AS + MR + TR + PR, mild PHTN, w/o mention of diastolic dysfxn.  PAST MEDICAL HISTORY:   Past Medical History:  Diagnosis Date  . Arthritis    all over  . Back pain   . COPD (chronic obstructive pulmonary disease) (HCC)    O2 use continuosly at 2 liters/ some  asthma symptoms  . Diabetes mellitus without complication (HCC)    diet controlled  . GERD (gastroesophageal reflux disease)   . Heart murmur    lifelong  . Hypercholesterolemia   . Hypertension    controlled on meds  . Pneumonia 2015   [facility] hospitalized  . Renal insufficiency    early stage kidney failure  . Shortness of breath dyspnea   . Sleep apnea    2nd sleep study said pt does not have sleep apnea  . Swelling    of feet and legs    PAST SURGICAL HISTORY:   Past Surgical History:  Procedure Laterality Date  . ABDOMINAL HYSTERECTOMY  1988  . BACK SURGERY  1978 and 1995   residual nerve damage legs-weakness  . BLADDER SURGERY  2017  . CATARACT EXTRACTION Right   . CATARACT EXTRACTION W/PHACO Left 09/05/2014   Procedure: CATARACT EXTRACTION PHACO AND INTRAOCULAR LENS PLACEMENT (IOC);  Surgeon: Lockie Mola, MD;  Location: [facility];  Service: Ophthalmology;  Laterality: Left;  DIABETIC  . CHOLECYSTECTOMY  2011  . INCISION AND DRAINAGE PERIRECTAL ABSCESS N/A 08/20/2017  Procedure: IRRIGATION AND DEBRIDEMENT PERIRECTAL ABSCESS;  Surgeon: Leafy RoPabon, Diego F, MD;  Location: [facility] ORS;  Service: General;  Laterality: N/A;  . JOINT REPLACEMENT Right 2009  . RECTAL EXAM UNDER ANESTHESIA N/A 08/20/2017   Procedure: RECTAL EXAM UNDER ANESTHESIA;  Surgeon: Leafy RoPabon, Diego F, MD;  Location: [facility] ORS;  Service: General;  Laterality: N/A;  . REPLACEMENT TOTAL KNEE Right   . REVERSE SHOULDER ARTHROPLASTY Right 01/08/2017   Procedure: REVERSE SHOULDER ARTHROPLASTY;  Surgeon: Signa KellPatel, Sunny, MD;  Location: [facility] ORS;  Service: Orthopedics;  Laterality: Right;    SOCIAL  HISTORY:   Social History   Tobacco Use  . Smoking status: Current Every Day Smoker    Packs/day: 0.25    Years: 50.00    Pack years: 12.50    Types: Cigarettes  . Smokeless tobacco: Never Used  Substance Use Topics  . Alcohol use: Yes    Alcohol/week: 1.8 oz    Types: 3 Glasses of wine per week    Comment: rarely    FAMILY HISTORY:   Family History  Problem Relation Age of Onset  . Breast cancer Mother 6375  . Breast cancer Paternal Grandmother [phone]    DRUG ALLERGIES:   Allergies  Allergen Reactions  . Tequin [Gatifloxacin] Shortness Of Breath  . Bupropion     Other reaction(s): Other (See Comments) shaking  . Keflex [Cephalexin] Other (See Comments)    Unknown  . Lisinopril Other (See Comments)    Unknown  . Varenicline Nausea Only and Nausea And Vomiting    REVIEW OF SYSTEMS:   Review of Systems  Constitutional: Positive for malaise/fatigue. Negative for chills, diaphoresis, fever and weight loss.  HENT: Negative for congestion, ear pain, hearing loss, nosebleeds, sinus pain, sore throat and tinnitus.   Eyes: Negative for blurred vision, double vision and photophobia.  Respiratory: Negative for cough, hemoptysis, sputum production, shortness of breath and wheezing.   Cardiovascular: Negative for chest pain, palpitations, orthopnea, claudication, leg swelling and PND.  Gastrointestinal: Positive for diarrhea. Negative for abdominal pain, blood in stool, constipation, heartburn, melena, nausea and vomiting.  Genitourinary: Negative for dysuria, frequency, hematuria and urgency.  Musculoskeletal: Positive for back pain and joint pain. Negative for myalgias and neck pain.  Skin: Negative for itching and rash.  Neurological: Positive for weakness. Negative for dizziness, tingling, tremors, sensory change, speech change, focal weakness, seizures and headaches.  Psychiatric/Behavioral: The patient does not have insomnia.    MEDICATIONS AT HOME:   Prior to Admission  medications   Medication Sig Start Date End Date Taking? Authorizing Provider  albuterol (PROVENTIL HFA;VENTOLIN HFA) 108 (90 Base) MCG/ACT inhaler Inhale 2 puffs into the lungs every 6 (six) hours as needed for wheezing or shortness of breath.   Yes [provider]  allopurinol (ZYLOPRIM) 100 MG tablet Take 200 mg by mouth daily.    Yes [provider]  aspirin EC 81 MG tablet Take 81 mg by mouth daily.    Yes [provider]  bisacodyl (DULCOLAX) 5 MG EC tablet Take 2 tablets (10 mg total) by mouth daily. 08/27/17  Yes Shaune Pollackhen, Qing, MD  calcium carbonate (TUMS - DOSED IN MG ELEMENTAL CALCIUM) 500 MG chewable tablet Chew 2 tablets (1,000 mg total) by mouth 3 (three) times daily with meals. 08/26/17  Yes Shaune Pollackhen, Qing, MD  diclofenac (FLECTOR) 1.3 % PTCH Place 1 patch onto the skin every 12 (twelve) hours. And remover per schedule   Yes [provider]  ezetimibe (ZETIA) 10 MG tablet  Take 10 mg by mouth at bedtime.   Yes [provider]  fluticasone (FLONASE) 50 MCG/ACT nasal spray Place 2 sprays into both nostrils daily as needed for allergies. 12/08/16  Yes [provider]  fluticasone-salmeterol (ADVAIR HFA) 115-21 MCG/ACT inhaler Inhale 2 puffs into the lungs 2 (two) times daily.    Yes [provider]  folic acid (FOLVITE) 1 MG tablet Take 1 tablet (1 mg total) by mouth daily. 08/27/17  Yes Shaune Pollack, MD  guaiFENesin (MUCINEX) 600 MG 12 hr tablet Take 600 mg by mouth every 12 (twelve) hours as needed for cough.   Yes [provider]  LORazepam (ATIVAN) 0.5 MG tablet Take 0.5 mg by mouth every 4 (four) hours as needed for anxiety.   Yes [provider]  metoprolol succinate (TOPROL-XL) 25 MG 24 hr tablet Take 1 tablet (25 mg total) by mouth daily. 09/07/17  Yes Enedina Finner, MD  omeprazole (PRILOSEC) 40 MG capsule Take 40 mg by mouth daily.    Yes [provider]  ondansetron (ZOFRAN) 4 MG tablet Take 4 mg by mouth  every 6 (six) hours as needed for nausea or vomiting.   Yes [provider]  pramipexole (MIRAPEX) 0.5 MG tablet Take 1 mg by mouth at bedtime.    Yes [provider]  theophylline (UNIPHYL) 400 MG 24 hr tablet Take 1 tablet by mouth daily.  08/17/17  Yes [provider]  tiotropium (SPIRIVA) 18 MCG inhalation capsule Place 18 mcg into inhaler and inhale daily.    Yes [provider]  traMADol (ULTRAM) 50 MG tablet Take 50 mg by mouth every 8 (eight) hours as needed for moderate pain.   Yes [provider]  Vitamin D, Ergocalciferol, (DRISDOL) 50000 UNITS CAPS capsule Take 50,000 Units by mouth every 14 (fourteen) days. 09/08/17  Yes [provider]  docusate sodium (COLACE) 100 MG capsule Take 1 capsule (100 mg total) by mouth 2 (two) times daily. Patient not taking: Reported on 09/20/2017 08/26/17   Shaune Pollack, MD  meloxicam [facility]) 15 MG tablet  09/13/17   [provider]      VITAL SIGNS:  Blood pressure (!) 150/69, pulse 73, temperature 97.7 F (36.5 C), temperature source Oral, resp. rate (!) 22, height 5\' 2"  (1.575 m), weight 78.5 kg (173 lb), SpO2 96 %.  PHYSICAL EXAMINATION:  Physical Exam  Constitutional: She is oriented to person, place, and time. She appears well-developed and well-nourished. She is active and cooperative.  Non-toxic appearance. No distress. She is intubated. Nasal cannula in place.  HENT:  Head: Normocephalic and atraumatic.  Mouth/Throat: Oropharynx is clear and moist. Mucous membranes are not pale, dry and not cyanotic. No oropharyngeal exudate.  Eyes: Conjunctivae, EOM and lids are normal. No scleral icterus.  Neck: Neck supple. No JVD present. No thyromegaly present.  Cardiovascular: Normal rate, S1 normal and S2 normal. An irregularly irregular rhythm present.  No extrasystoles are present. Exam reveals no gallop, no S3, no S4 and no friction rub.  Murmur heard.  Systolic murmur is present with a  grade of 3/6. Pulmonary/Chest: Effort normal. No accessory muscle usage or stridor. Tachypnea noted. No apnea and no bradypnea. She is intubated. No respiratory distress. She has decreased breath sounds in the right upper field, the right middle field, the right lower field, the left upper field, the left middle field and the left lower field. She has no wheezes. She has no rhonchi. She has no rales.  Abdominal: Soft.  Bowel sounds are normal. She exhibits no distension. There is no tenderness. There is no rebound and no guarding.  Musculoskeletal: Normal range of motion. She exhibits no edema.       Right shoulder: She exhibits tenderness and pain. She exhibits no swelling, no effusion, no deformity and normal strength.  Lymphadenopathy:    She has no cervical adenopathy.  Neurological: She is alert and oriented to person, place, and time. She is not disoriented.  Skin: Skin is warm and dry. No rash noted. She is not diaphoretic. No erythema.  Psychiatric: She has a normal mood and affect. Her speech is normal and behavior is normal. Judgment and thought content normal. She exhibits abnormal recent memory.   LABORATORY PANEL:   CBC Recent Labs  Lab 09/19/17 2221  WBC 9.3  HGB 10.7*  HCT 33.9*  PLT 298   ------------------------------------------------------------------------------------------------------------------  Chemistries  Recent Labs  Lab 09/19/17 2221  NA 144  K 3.9  CL 106  CO2 29  GLUCOSE 140*  BUN 8  CREATININE 0.83  CALCIUM 7.4*  AST 28  ALT 10  ALKPHOS 83  BILITOT 0.9   ------------------------------------------------------------------------------------------------------------------  Cardiac Enzymes No results for input(s): TROPONINI in the last 168 hours. ------------------------------------------------------------------------------------------------------------------  RADIOLOGY:  Ct Head Wo Contrast  Result Date: 09/19/2017 CLINICAL DATA:  Altered  mental status EXAM: CT HEAD WITHOUT CONTRAST TECHNIQUE: Contiguous axial images were obtained from the base of the skull through the vertex without intravenous contrast. COMPARISON:  CT brain 06/26/2017, MRI 06/20/2017 FINDINGS: Brain: No acute territorial infarction, hemorrhage or intracranial mass. Mild small vessel ischemic changes of the white matter. Mild atrophy. Stable ventricle size Vascular: No hyperdense vessels.  Carotid vascular calcification Skull: Normal. Negative for fracture or focal lesion. Sinuses/Orbits: Fluid within the inferior mastoid air cells. Paranasal sinuses are clear. No acute orbital abnormality. Other: Small left frontal scalp lesion. IMPRESSION: 1. No CT evidence for acute intracranial abnormality. 2. Atrophy and mild small vessel ischemic changes of the white matter 3. Moderate fluid within the bilateral mastoids. Electronically Signed   By: Jasmine Pang M.D.   On: 09/19/2017 23:53   Dg Chest Portable 1 View  Result Date: 09/19/2017 CLINICAL DATA:  Hypoxia.  Altered level consciousness. EXAM: PORTABLE CHEST 1 VIEW COMPARISON:  09/03/2017 FINDINGS: Mild cardiomegaly stable.  Aortic atherosclerosis. Small left pleural effusion is increased in size since previous study. No evidence of pulmonary edema or consolidation. IMPRESSION: Increased size of small left pleural effusion. Stable cardiomegaly. Electronically Signed   By: Myles Rosenthal M.D.   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 AMS resolved, pt AAOx3. Also w/ dehydration, R shoulder pain, sacral pain (w/ recent abscess s/p tube/drain), hyperglycemia (w/ diet-ctrl T2NIDDM), hypocalcemia, hypoalbuminemia, normocytic anemia. -AAOx3 -CT head (-) acute intracranial abnl -TSH, B12, Folate, RPR, ammonia pending -Afebrile, (-) leukocytosis -(-) urinary symptoms; U/A pending -(-) cough/SOB; lungs diminished but clear, (-) R/R/W -CXR  demonstrates, "Small left pleural effusion is increased in size since previous study. No evidence of pulmonary edema or consolidation." -Presentation likely 2/2 pt not being on O2 at facility -VBG w/ PCO2 65, pH 7.29, likely chronic hypercapnoeia -DuoNebs PRN -c/w Spiriva, LABA/ICS, Theophylline -Theophylline level pending -Incentive spirometry -IVF -Pt c/o diarrhea, previously on standing Dulcolax, D/Ced -Pain ctrl -Wound nurse consult, foam wedge, turn q2h -SSI -Ionized calcium -Prealbumin -Normocytic anemia likely anemia of chronic disease, no evidence of acute blood loss -c/w home  meds -Cardiac diabetic diet -Lovenox -DNR/DNI -Observation, < 2 midnights   All the records are reviewed and case discussed with ED provider. Management plans discussed with the patient, family and they are in agreement.  CODE STATUS: DNR/DNI  TOTAL TIME TAKING CARE OF THIS PATIENT: 90 minutes.    Barbaraann Rondo M.D on 09/20/2017 at 4:12 AM  Between 7am to 6pm - Pager - [phone]  After 6pm go to [email or web address] - Scientist, research (life sciences) [facility]  Office  [phone]  CC: Primary care physician; Lauro Regulus, MD   Note: This dictation was prepared with Dragon dictation along with smaller phrase technology. Any transcriptional errors that result from this process are unintentional.

## 2017-09-20 NOTE — Progress Notes (Signed)
Pt in no acute distress. VSS. RN removed IV. [facility] to [facility] at [facility]. EMS called to transport the pt.

## 2017-09-20 NOTE — ED Notes (Signed)
Patient transported to 144 

## 2017-09-20 NOTE — NC FL2 (Signed)
MEDICAID FL2 LEVEL OF CARE SCREENING TOOL     IDENTIFICATION  Patient Name: [name] NoJamie Graves Thorpe Birthdate: 06-12-1940 Sex: female Admission Date (Current Location): 09/19/2017  [place]   Facility and Address:  [facility], [address], [place], [place] [address]      Provider Number: [id number]  Attending Physician Name and Address:  Adrian SaranMody, Sital, MD  Relative Name and Phone Number:       Current Level of Care: Hospital Recommended Level of Care: Skilled Nursing Facility Prior Approval Number:    Date Approved/Denied:   PASRR Number: ([id number] [id number])  Discharge Plan: SNF    Current Diagnoses: Patient Active Problem List   Diagnosis Date Noted  . Altered mental status 09/20/2017  . Abscess of anal and rectal regions   . Acute respiratory failure with hypoxia and hypercapnia (HCC)   . Hyperkalemia   . HCAP (healthcare-associated pneumonia) 09/03/2017  . Acute on chronic respiratory failure with hypoxia (HCC) 09/03/2017  . Osteoarthritis 08/30/2017  . Acute cystitis 08/27/2017  . Restless leg syndrome 08/21/2017  . Perianal abscess   . Sepsis (HCC) 08/15/2017  . BMI 40.0-44.9, adult (HCC) 08/05/2017  . Bilateral lower extremity edema 07/28/2017  . Lower extremity numbness 07/28/2017  . ARF (acute renal failure) (HCC) 06/20/2017  . 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 Multiple thyroid nodules 10/22/2016  . 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 Lymphedema 01/06/2016  . Chronic venous insufficiency 01/06/2016  . Pain in limb 01/06/2016  . COPD (chronic obstructive pulmonary disease) (HCC) 01/06/2016  . Abnormality of gait 01/06/2016  . Hypomagnesemia 05/24/2015  . Type 2 diabetes mellitus (HCC) 11/16/2013  . Benign essential hypertension 11/16/2013  . GERD (gastroesophageal reflux disease) 11/16/2013  . Hyperlipidemia 11/16/2013    Orientation  RESPIRATION BLADDER Height & Weight     Self, Time, Situation, Place  O2(4 Liters Oxygen. ) Incontinent Weight: 78.5 kg (173 lb) Height:  5\' 2"  (157.5 cm)  BEHAVIORAL SYMPTOMS/MOOD NEUROLOGICAL BOWEL NUTRITION STATUS      Continent Diet(Diet: Heart Healthy/ Carb Modified. )  AMBULATORY STATUS COMMUNICATION OF NEEDS Skin   Extensive Assist Verbally Other (Comment)(sacral abscess)                       Personal Care Assistance Level of Assistance  Bathing, Feeding, Dressing Bathing Assistance: Limited assistance Feeding assistance: Independent Dressing Assistance: Limited assistance     Functional Limitations Info  Sight, Hearing, Speech Sight Info: Adequate Hearing Info: Adequate Speech Info: Adequate    SPECIAL CARE FACTORS FREQUENCY  PT (By licensed PT), OT (By licensed OT)     PT Frequency: (5) OT Frequency: (5)            Contractures      Additional Factors Info  Code Status, Allergies, Isolation Precautions Code Status Info: (Full Code. ) Allergies Info: (Tequin Gatifloxacin, Bupropion, Keflex Cephalexin, Lisinopril, Varenicline)     Isolation Precautions Info: (MRSA Nasal Swab. )     Current Medications (09/20/2017):  This is the current hospital active medication list Current Facility-Administered Medications  Medication Dose Route Frequency Provider Last Rate Last Dose  . acetaminophen (TYLENOL) tablet 650 mg  650 mg Oral Q6H PRN Barbaraann RondoSridharan, Prasanna, MD       Or  . acetaminophen (TYLENOL) suppository 650 mg  650 mg Rectal Q6H PRN Barbaraann RondoSridharan, Prasanna, MD      . albuterol (PROVENTIL) (2.5 MG/3ML)  0.083% nebulizer solution 2.5 mg  2.5 mg Nebulization Q4H PRN Barbaraann Rondo, MD      . allopurinol (ZYLOPRIM) tablet 200 mg  200 mg Oral Daily Barbaraann Rondo, MD      . aspirin EC tablet 81 mg  81 mg Oral Daily Barbaraann Rondo, MD      . calcium carbonate (TUMS - dosed in mg elemental calcium) chewable tablet 1,000 mg  1,000 mg Oral TID WC  Barbaraann Rondo, MD      . Chlorhexidine Gluconate Cloth 2 % PADS 6 each  6 each Topical Q0600 Mody, Sital, MD      . diclofenac (FLECTOR) 1.3 % 1 patch  1 patch Transdermal Q12H Sridharan, Prasanna, MD      . enoxaparin (LOVENOX) injection 40 mg  40 mg Subcutaneous Q24H Barbaraann Rondo, MD      . ezetimibe (ZETIA) tablet 10 mg  10 mg Oral QHS Barbaraann Rondo, MD      . folic acid (FOLVITE) tablet 1 mg  1 mg Oral Daily Barbaraann Rondo, MD      . guaiFENesin (MUCINEX) 12 hr tablet 600 mg  600 mg Oral Q12H PRN Barbaraann Rondo, MD      . insulin aspart (novoLOG) injection 0-5 Units  0-5 Units Subcutaneous QHS Sridharan, Prasanna, MD      . insulin aspart (novoLOG) injection 0-9 Units  0-9 Units Subcutaneous TID WC Barbaraann Rondo, MD      . lactated ringers infusion   Intravenous Continuous Barbaraann Rondo, MD 125 mL/hr at 09/20/17 0504    . lactulose (CHRONULAC) 10 GM/15ML solution 30 g  30 g Oral BID PRN Barbaraann Rondo, MD      . metoprolol succinate (TOPROL-XL) 24 hr tablet 25 mg  25 mg Oral Daily Marjie Skiff, Bosie Helper, MD      . mometasone-formoterol (DULERA) 200-5 MCG/ACT inhaler 2 puff  2 puff Inhalation BID Barbaraann Rondo, MD      . mupirocin ointment (BACTROBAN) 2 % 1 application  1 application Nasal BID Juliene Pina, Sital, MD      . ondansetron (ZOFRAN) tablet 4 mg  4 mg Oral Q6H PRN Barbaraann Rondo, MD       Or  . ondansetron (ZOFRAN) injection 4 mg  4 mg Intravenous Q6H PRN Barbaraann Rondo, MD      . oxyCODONE (Oxy IR/ROXICODONE) immediate release tablet 2.5 mg  2.5 mg Oral Q3H PRN Barbaraann Rondo, MD      . pantoprazole (PROTONIX) EC tablet 40 mg  40 mg Oral Daily Marjie Skiff, Prasanna, MD      . pramipexole (MIRAPEX) tablet 1 mg  1 mg Oral QHS Sridharan, Prasanna, MD      . senna-docusate (Senokot-S) tablet 1 tablet  1 tablet Oral QHS PRN Barbaraann Rondo, MD      . theophylline (UNIPHYL) 400 MG 24 hr tablet 400 mg  400 mg Oral Daily Barbaraann Rondo, MD      . tiotropium (SPIRIVA) inhalation capsule 18 mcg  18 mcg Inhalation Daily Barbaraann Rondo, MD         Discharge Medications: Please see discharge summary for a list of discharge medications.  Relevant Imaging Results:  Relevant Lab Results:   Additional Information (SSN: [id number])  Toyia Jelinek, [name], LCSW

## 2017-09-20 NOTE — Progress Notes (Signed)
Patient was weaned off bipap per Dr Lamont Snowballifenbark appx 0110 to nasal cannula. Patient tolerating well during my visit

## 2017-09-20 NOTE — Clinical Social Work Note (Signed)
Clinical Social Work Assessment  Patient Details  Name: Brenda Glenn MRN: [id number] Date of Birth: 1940/07/10  Date of referral:  09/20/17               Reason for consult:  Other (Comment Required)([facility])                Permission sought to share information with:  Oceanographeracility Contact Representative Permission granted to share information::  Yes, Verbal Permission Granted  Name::      [facility]   Agency::     Relationship::     Contact Information:     Housing/Transportation Living arrangements for the past 2 months:  Skilled Building surveyorursing Facility Source of Information:  Patient, Adult Children Patient Interpreter Needed:  None Criminal Activity/Legal Involvement Pertinent to Current Situation/Hospitalization:  No - Comment as needed Significant Relationships:  Adult Children Lives with:  Facility Resident Do you feel safe going back to the place where you live?  Yes Need for family participation in patient care:  Yes (Comment)  Care giving concerns:  [facility] is at for short term rehab.    Social Worker assessment / plan:  Visual merchandiserClinical Social Worker (CSW) received verbal consult from MD that [facility] is [facility] and is ready to discharge back there today. Per MD patient has been there for 3 weeks and has not made progress with ambulation. Per Tiffany admissions coordinator at [facility] patient is a short term rehab resident and can return today to room 405. RN will call report and arrange EMS for transport. CSW sent D/C orders to [facility] via [facility]. Patient is aware of above and in agreement with plan. Patient's daughter Brenda RegalCarol is at bedside and in agreement with plan. Please reconsult if future social work needs arise. CSW signing off.   Employment status:  Disabled (Comment on whether or not currently receiving Disability), Retired Health and safety inspectornsurance information:  Medicare PT Recommendations:   Not assessed at this time Information / Referral to community resources:  Skilled Nursing Facility  Patient/Family's Response to care:  Patient is agreeable to D/C back to [facility] today.   Patient/Family's Understanding of and Emotional Response to Diagnosis, Current Treatment, and Prognosis:  Patient and her daughter Brenda RegalCarol were very pleasant and thanked CSW for assistance.   Emotional Assessment Appearance:  Appears stated age Attitude/Demeanor/Rapport:    Affect (typically observed):  Accepting, Adaptable, Pleasant Orientation:  Oriented to Self, Oriented to Place, Oriented to  Time, Oriented to Situation Alcohol / Substance use:  Not Applicable Psych involvement (Current and /or in the community):  No (Comment)  Discharge Needs  Concerns to be addressed:  Discharge Planning Concerns Readmission within the last 30 days:  Yes Current discharge risk:  None Barriers to Discharge:  No Barriers Identified   Brenda Glenn, Brenda CrockerBailey M, LCSW 09/20/2017, 10:31 AM

## 2017-09-20 NOTE — Discharge Summary (Signed)
[facility] [place] [facility]   PATIENT NAME: Brenda Glenn    MR#:  [id number]  DATE OF BIRTH:  Jul 29, 1940  DATE OF ADMISSION:  09/19/2017 ADMITTING PHYSICIAN: Barbaraann RondoPrasanna Sridharan, MD  DATE OF DISCHARGE: 09/20/2017  PRIMARY CARE PHYSICIAN: Mickey Farberhies, David, MD    ADMISSION DIAGNOSIS:  Altered mental status, unspecified altered mental status type [R41.82]  DISCHARGE DIAGNOSIS:  Active Problems:   Altered mental status   SECONDARY DIAGNOSIS:   Past Medical History:  Diagnosis Date  . Arthritis    all over  . Back pain   . COPD (chronic obstructive pulmonary disease) (HCC)    O2 use continuosly at 2 liters/ some  asthma symptoms  . Diabetes mellitus without complication (HCC)    diet controlled  . GERD (gastroesophageal reflux disease)   . Heart murmur    lifelong  . Hypercholesterolemia   . Hypertension    controlled on meds  . Pneumonia 2015   [facility] hospitalized  . Renal insufficiency    early stage kidney failure  . Shortness of breath dyspnea   . Sleep apnea    2nd sleep study said pt does not have sleep apnea  . Swelling    of feet and legs    HOSPITAL COURSE:   77 year old female with a history of chronic hypoxic respiratory failure due to COPD on 2 L oxygen and diabetes who presented to emergency room due to altered mental status.  1.  Acute respiratory encephalopathy due to hypercarbia: Patient symptoms have improved.  She was initially placed on BiPAP in the emergency room.  2.  Chronic hypoxic respiratory failure COPD without signs of exacerbation: Patient will continue with oxygen 2 L nasal cannula Continue theophylline and nebulizer treatments along with Advair 3.  Essential hypertension: She will continue metoprolol  .  Hyperlipidemia: Continue Zetia  DISCHARGE CONDITIONS AND DIET:   Stable for discharge on diabetic diet  CONSULTS OBTAINED:  Treatment Team:  Barbaraann RondoSridharan, Prasanna, MD  DRUG ALLERGIES:   Allergies  Allergen  Reactions  . Tequin [Gatifloxacin] Shortness Of Breath  . Bupropion     Other reaction(s): Other (See Comments) shaking  . Keflex [Cephalexin] Other (See Comments)    Unknown  . Lisinopril Other (See Comments)    Unknown  . Varenicline Nausea Only and Nausea And Vomiting    DISCHARGE MEDICATIONS:   Allergies as of 09/20/2017      Reactions   Tequin [gatifloxacin] Shortness Of Breath   Bupropion    Other reaction(s): Other (See Comments) shaking   Keflex [cephalexin] Other (See Comments)   Unknown   Lisinopril Other (See Comments)   Unknown   Varenicline Nausea Only, Nausea And Vomiting      Medication List    TAKE these medications   albuterol 108 (90 Base) MCG/ACT inhaler Commonly known as:  PROVENTIL HFA;VENTOLIN HFA Inhale 2 puffs into the lungs every 6 (six) hours as needed for wheezing or shortness of breath.   allopurinol 100 MG tablet Commonly known as:  ZYLOPRIM Take 200 mg by mouth daily.   aspirin EC 81 MG tablet Take 81 mg by mouth daily.   bisacodyl 5 MG EC tablet Commonly known as:  DULCOLAX Take 2 tablets (10 mg total) by mouth daily.   calcium carbonate 500 MG chewable tablet Commonly known as:  TUMS - dosed in mg elemental calcium Chew 2 tablets (1,000 mg total) by mouth 3 (three) times daily with meals.   diclofenac 1.3 % Ptch Commonly  known as:  FLECTOR Place 1 patch onto the skin every 12 (twelve) hours. And remover per schedule   docusate sodium 100 MG capsule Commonly known as:  COLACE Take 1 capsule (100 mg total) by mouth 2 (two) times daily.   ezetimibe 10 MG tablet Commonly known as:  ZETIA Take 10 mg by mouth at bedtime.   fluticasone 50 MCG/ACT nasal spray Commonly known as:  FLONASE Place 2 sprays into both nostrils daily as needed for allergies.   fluticasone-salmeterol 115-21 MCG/ACT inhaler Commonly known as:  ADVAIR HFA Inhale 2 puffs into the lungs 2 (two) times daily.   folic acid 1 MG tablet Commonly known as:   FOLVITE Take 1 tablet (1 mg total) by mouth daily.   LORazepam 0.5 MG tablet Commonly known as:  ATIVAN Take 0.5 mg by mouth every 4 (four) hours as needed for anxiety.   meloxicam 15 MG tablet Commonly known as:  MOBIC   metoprolol succinate 25 MG 24 hr tablet Commonly known as:  TOPROL-XL Take 1 tablet (25 mg total) by mouth daily.   MUCINEX 600 MG 12 hr tablet Generic drug:  guaiFENesin Take 600 mg by mouth every 12 (twelve) hours as needed for cough.   omeprazole 40 MG capsule Commonly known as:  PRILOSEC Take 40 mg by mouth daily.   ondansetron 4 MG tablet Commonly known as:  ZOFRAN Take 4 mg by mouth every 6 (six) hours as needed for nausea or vomiting.   pramipexole 0.5 MG tablet Commonly known as:  MIRAPEX Take 1 mg by mouth at bedtime.   theophylline 400 MG 24 hr tablet Commonly known as:  UNIPHYL Take 1 tablet by mouth daily.   tiotropium 18 MCG inhalation capsule Commonly known as:  SPIRIVA Place 18 mcg into inhaler and inhale daily.   traMADol 50 MG tablet Commonly known as:  ULTRAM Take 1 tablet (50 mg total) by mouth every 8 (eight) hours as needed for moderate pain.   Vitamin D (Ergocalciferol) 50000 units Caps capsule Commonly known as:  DRISDOL Take 50,000 Units by mouth every 14 (fourteen) days.         Today   CHIEF COMPLAINT:   Doing well this am mental status improved   VITAL SIGNS:  Blood pressure (!) 151/102, pulse 74, temperature 97.7 F (36.5 C), temperature source Oral, resp. rate (!) 22, height 5\' 2"  (1.575 m), weight 78.5 kg (173 lb), SpO2 94 %.   REVIEW OF SYSTEMS:  Review of Systems  Constitutional: Positive for malaise/fatigue. Negative for chills and fever.  HENT: Negative.  Negative for ear discharge, ear pain, hearing loss, nosebleeds and sore throat.   Eyes: Negative.  Negative for blurred vision and pain.  Respiratory: Negative.  Negative for cough, hemoptysis, shortness of breath and wheezing.   Cardiovascular:  Negative.  Negative for chest pain, palpitations and leg swelling.  Gastrointestinal: Negative.  Negative for abdominal pain, blood in stool, diarrhea, nausea and vomiting.  Genitourinary: Negative.  Negative for dysuria.  Musculoskeletal: Negative.  Negative for back pain.  Skin: Negative.   Neurological: Negative for dizziness, tremors, speech change, focal weakness, seizures and headaches.  Endo/Heme/Allergies: Negative.  Does not bruise/bleed easily.  Psychiatric/Behavioral: Negative.  Negative for depression, hallucinations and suicidal ideas.     PHYSICAL EXAMINATION:  GENERAL:  77 y.o.-year-old patient lying in the bed with no acute distress.  NECK:  Supple, no jugular venous distention. No thyroid enlargement, no tenderness.  LUNGS: Normal breath sounds bilaterally, no wheezing, rales,rhonchi  No use of accessory muscles of respiration.  CARDIOVASCULAR: S1, S2 normal. No murmurs, rubs, or gallops.  ABDOMEN: Soft, non-tender, non-distended. Bowel sounds present. No organomegaly or mass.  EXTREMITIES: No pedal edema, cyanosis, or clubbing.  PSYCHIATRIC: The patient is alert and oriented x 3.  SKIN: No obvious rash, lesion, or ulcer.   DATA REVIEW:   CBC Recent Labs  Lab 09/19/17 2221  WBC 9.3  HGB 10.7*  HCT 33.9*  PLT 298    Chemistries  Recent Labs  Lab 09/19/17 2221  NA 144  K 3.9  CL 106  CO2 29  GLUCOSE 140*  BUN 8  CREATININE 0.83  CALCIUM 7.4*  AST 28  ALT 10  ALKPHOS 83  BILITOT 0.9    Cardiac Enzymes No results for input(s): TROPONINI in the last 168 hours.  Microbiology Results  @MICRORSLT48 @  RADIOLOGY:  Ct Head Wo Contrast  Result Date: 09/19/2017 CLINICAL DATA:  Altered mental status EXAM: CT HEAD WITHOUT CONTRAST TECHNIQUE: Contiguous axial images were obtained from the base of the skull through the vertex without intravenous contrast. COMPARISON:  CT brain 06/26/2017, MRI 06/20/2017 FINDINGS: Brain: No acute territorial infarction,  hemorrhage or intracranial mass. Mild small vessel ischemic changes of the white matter. Mild atrophy. Stable ventricle size Vascular: No hyperdense vessels.  Carotid vascular calcification Skull: Normal. Negative for fracture or focal lesion. Sinuses/Orbits: Fluid within the inferior mastoid air cells. Paranasal sinuses are clear. No acute orbital abnormality. Other: Small left frontal scalp lesion. IMPRESSION: 1. No CT evidence for acute intracranial abnormality. 2. Atrophy and mild small vessel ischemic changes of the white matter 3. Moderate fluid within the bilateral mastoids. Electronically Signed   By: Jasmine Pang M.D.   On: 09/19/2017 23:53   Dg Chest Portable 1 View  Result Date: 09/19/2017 CLINICAL DATA:  Hypoxia.  Altered level consciousness. EXAM: PORTABLE CHEST 1 VIEW COMPARISON:  09/03/2017 FINDINGS: Mild cardiomegaly stable.  Aortic atherosclerosis. Small left pleural effusion is increased in size since previous study. No evidence of pulmonary edema or consolidation. IMPRESSION: Increased size of small left pleural effusion. Stable cardiomegaly. Electronically Signed   By: Myles Rosenthal M.D.   On: 09/19/2017 23:13      Allergies as of 09/20/2017      Reactions   Tequin [gatifloxacin] Shortness Of Breath   Bupropion    Other reaction(s): Other (See Comments) shaking   Keflex [cephalexin] Other (See Comments)   Unknown   Lisinopril Other (See Comments)   Unknown   Varenicline Nausea Only, Nausea And Vomiting      Medication List    TAKE these medications   albuterol 108 (90 Base) MCG/ACT inhaler Commonly known as:  PROVENTIL HFA;VENTOLIN HFA Inhale 2 puffs into the lungs every 6 (six) hours as needed for wheezing or shortness of breath.   allopurinol 100 MG tablet Commonly known as:  ZYLOPRIM Take 200 mg by mouth daily.   aspirin EC 81 MG tablet Take 81 mg by mouth daily.   bisacodyl 5 MG EC tablet Commonly known as:  DULCOLAX Take 2 tablets (10 mg total) by mouth  daily.   calcium carbonate 500 MG chewable tablet Commonly known as:  TUMS - dosed in mg elemental calcium Chew 2 tablets (1,000 mg total) by mouth 3 (three) times daily with meals.   diclofenac 1.3 % Ptch Commonly known as:  FLECTOR Place 1 patch onto the skin every 12 (twelve) hours. And remover per schedule   docusate sodium 100 MG capsule Commonly  known as:  COLACE Take 1 capsule (100 mg total) by mouth 2 (two) times daily.   ezetimibe 10 MG tablet Commonly known as:  ZETIA Take 10 mg by mouth at bedtime.   fluticasone 50 MCG/ACT nasal spray Commonly known as:  FLONASE Place 2 sprays into both nostrils daily as needed for allergies.   fluticasone-salmeterol 115-21 MCG/ACT inhaler Commonly known as:  ADVAIR HFA Inhale 2 puffs into the lungs 2 (two) times daily.   folic acid 1 MG tablet Commonly known as:  FOLVITE Take 1 tablet (1 mg total) by mouth daily.   LORazepam 0.5 MG tablet Commonly known as:  ATIVAN Take 0.5 mg by mouth every 4 (four) hours as needed for anxiety.   meloxicam 15 MG tablet Commonly known as:  MOBIC   metoprolol succinate 25 MG 24 hr tablet Commonly known as:  TOPROL-XL Take 1 tablet (25 mg total) by mouth daily.   MUCINEX 600 MG 12 hr tablet Generic drug:  guaiFENesin Take 600 mg by mouth every 12 (twelve) hours as needed for cough.   omeprazole 40 MG capsule Commonly known as:  PRILOSEC Take 40 mg by mouth daily.   ondansetron 4 MG tablet Commonly known as:  ZOFRAN Take 4 mg by mouth every 6 (six) hours as needed for nausea or vomiting.   pramipexole 0.5 MG tablet Commonly known as:  MIRAPEX Take 1 mg by mouth at bedtime.   theophylline 400 MG 24 hr tablet Commonly known as:  UNIPHYL Take 1 tablet by mouth daily.   tiotropium 18 MCG inhalation capsule Commonly known as:  SPIRIVA Place 18 mcg into inhaler and inhale daily.   traMADol 50 MG tablet Commonly known as:  ULTRAM Take 1 tablet (50 mg total) by mouth every 8 (eight)  hours as needed for moderate pain.   Vitamin D (Ergocalciferol) 50000 units Caps capsule Commonly known as:  DRISDOL Take 50,000 Units by mouth every 14 (fourteen) days.           Management plans discussed with the patient and husband and she is in agreement. Stable for discharge   Patient should follow up with pcp  CODE STATUS:     Code Status Orders  (From admission, onward)        Start     Ordered   09/20/17 0900  Full code  Continuous     09/20/17 0859    Code Status History    Date Active Date Inactive Code Status Order ID Comments User Context   09/20/2017 0240 09/20/2017 0859 DNR [id number]  Barbaraann Rondo, MD Inpatient   09/03/2017 1854 09/07/2017 1701 DNR [id number]  Altamese Dilling, MD Inpatient   09/03/2017 1755 09/03/2017 1854 DNR [id number]  Altamese Dilling, MD ED   08/15/2017 2029 08/26/2017 2255 Full Code [id number]  Auburn Bilberry, MD Inpatient   06/20/2017 1157 06/21/2017 1837 DNR [id number]  Shaune Pollack, MD Inpatient   01/08/2017 1422 01/09/2017 1959 Full Code [id number]  Signa Kell, MD Inpatient      TOTAL TIME TAKING CARE OF THIS PATIENT: 38 minutes.    Note: This dictation was prepared with Dragon dictation along with smaller phrase technology. Any transcriptional errors that result from this process are unintentional.  Jacey Pelc M.D on 09/20/2017 at 9:03 AM  Between 7am to 6pm - Pager - [phone] After 6pm go to [email or web address] - Social research officer, government  [facility]  Office  [phone]  CC: Primary care physician; Mickey Farber, MD

## 2017-09-20 NOTE — Progress Notes (Addendum)
Family Meeting Note  Advance Directive:yes  Today a meeting took place with the Patient.  The following clinical team members were present during this meeting:MD  The following were discussed:Patient's diagnosis: Acute encephalopathy, metabolic Chronic respiratory failure on 2 L oxygen, Patient's progosis: Unable to determine and Goals for treatment: Full Code  Additional follow-up to be provided: Patient would like to be full code.  DNR order changed to full code.  Time spent during discussion: 16 minutes  Brenda Tiegs, MD

## 2017-09-21 LAB — RPR: RPR Ser Ql: NONREACTIVE

## 2017-09-21 LAB — CALCIUM, IONIZED: Calcium, Ionized, Serum: 4.2 mg/dL — ABNORMAL LOW (ref 4.5–5.6)

## 2017-09-21 LAB — THEOPHYLLINE LEVEL: Theophylline Lvl: 4 ug/mL — ABNORMAL LOW (ref 10.0–20.0)

## 2017-09-22 ENCOUNTER — Ambulatory Visit (INDEPENDENT_AMBULATORY_CARE_PROVIDER_SITE_OTHER): Payer: Medicare Other | Admitting: Vascular Surgery

## 2017-09-22 ENCOUNTER — Encounter (INDEPENDENT_AMBULATORY_CARE_PROVIDER_SITE_OTHER): Payer: Medicare Other

## 2017-10-31 DEATH — deceased

## 2017-11-20 IMAGING — DX DG SHOULDER 2+V PORT*R*
2 series · 2 of 2 positions shown · non-contrast
Comparison: CT of the right shoulder 12/10/2016

CLINICAL DATA: Status post right shoulder replacement.

EXAM:
PORTABLE RIGHT SHOULDER

[shoulder ap (1 of 2)]
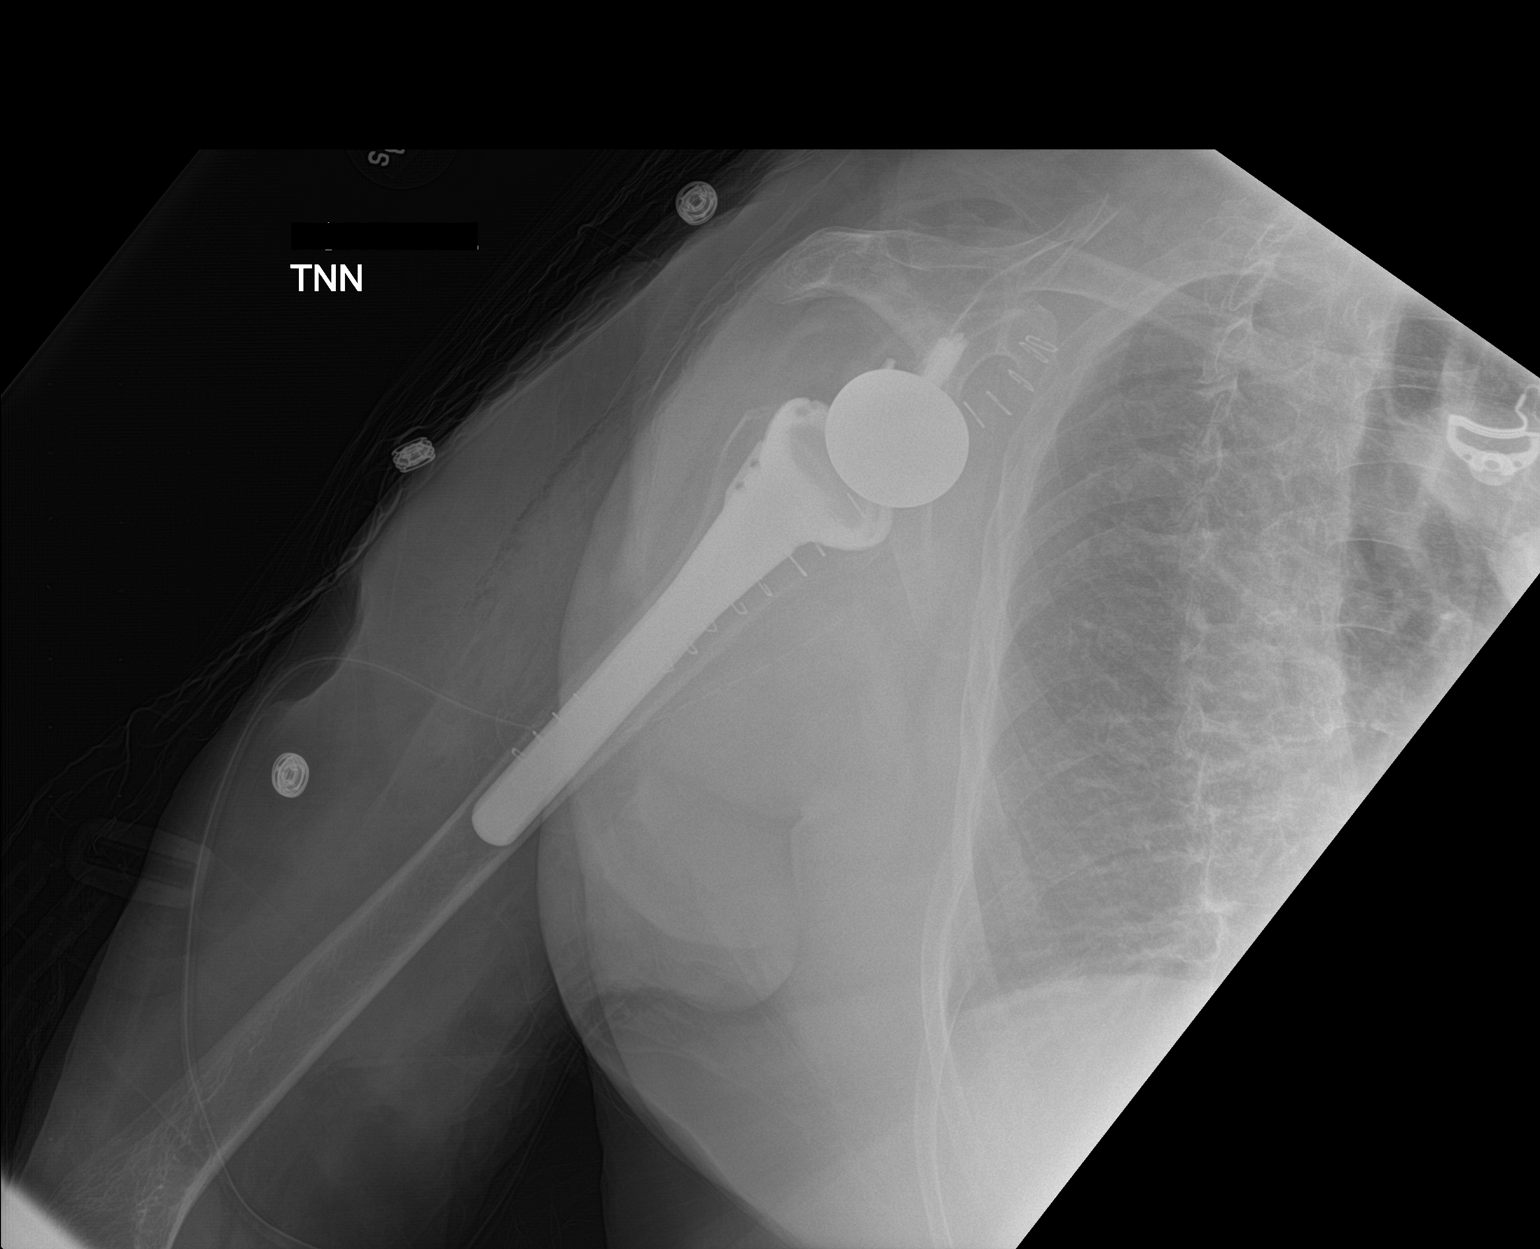

[shoulder ap (2 of 2)]
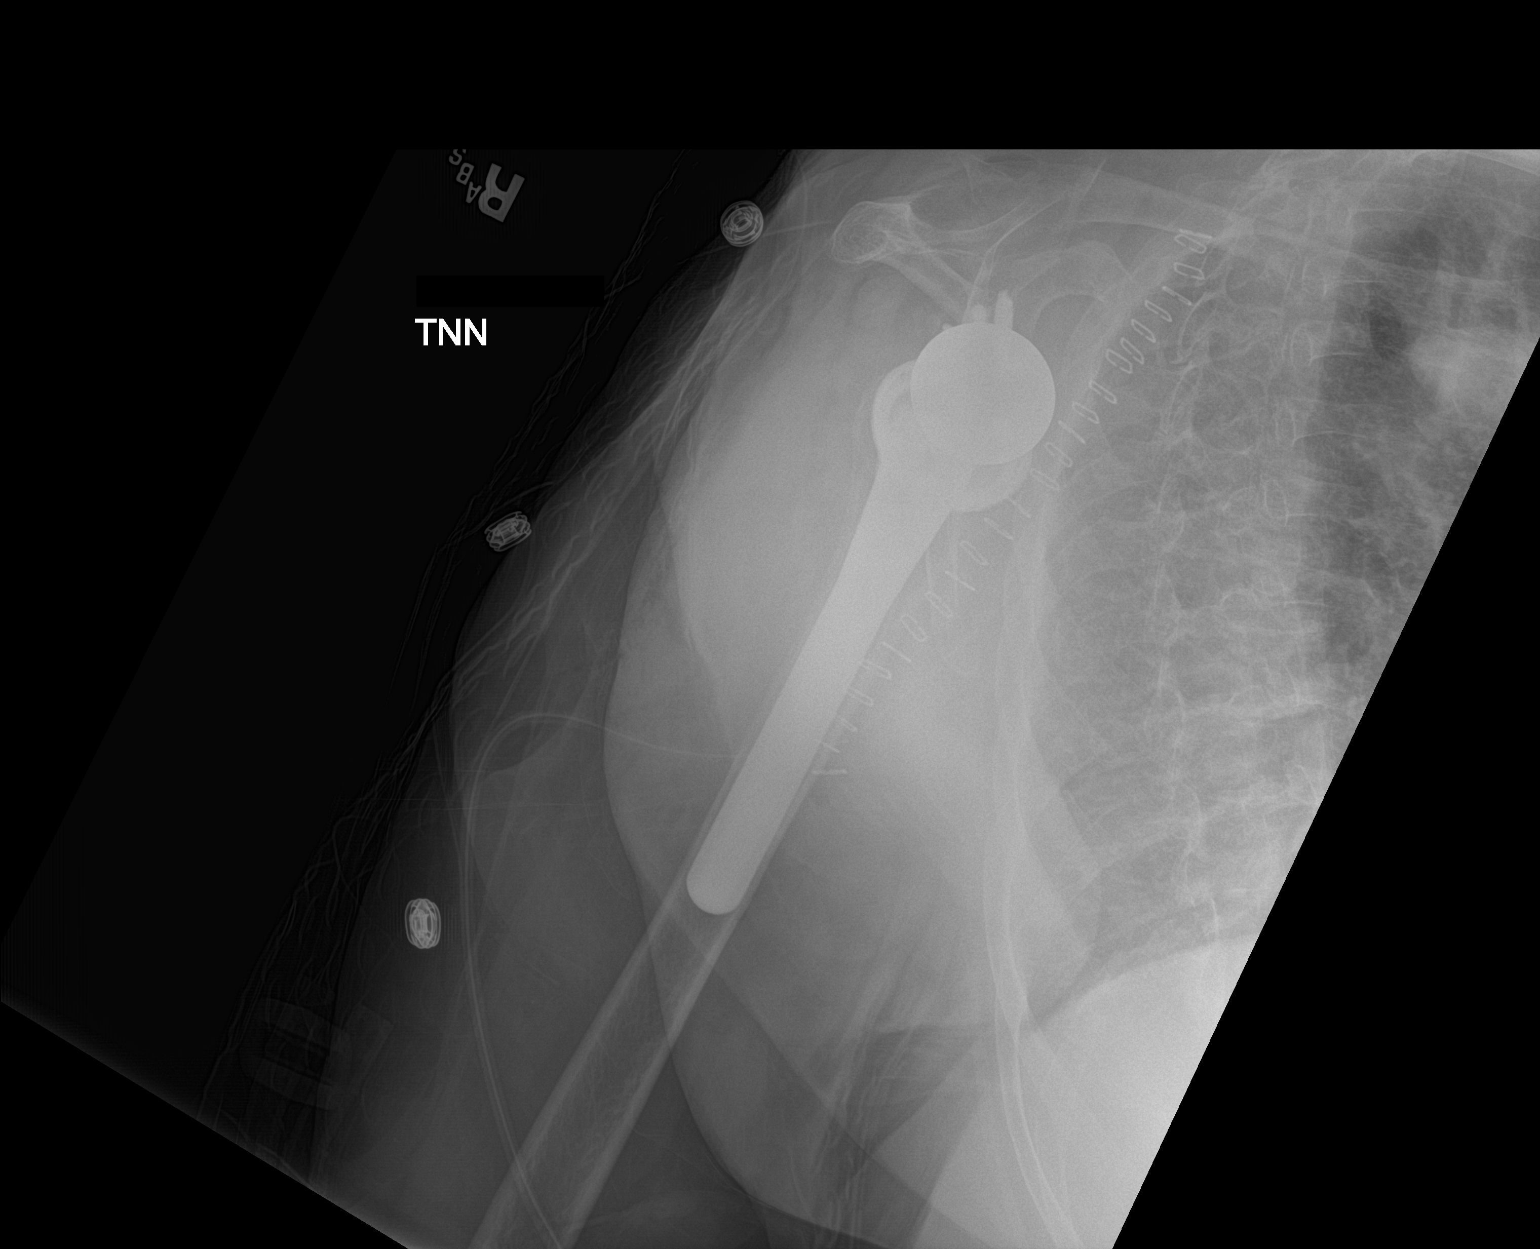

[2 of 2 positions shown; findings below may reference images not displayed]

FINDINGS: Post recent right total shoulder arthroplasty with normal alignment
of the orthopedic hardware. Postsurgical soft tissue emphysema,
surgical drain and skin staples are noted. No evidence of fracture.
IMPRESSION: Post recent total right shoulder arthroplasty without evidence of
immediate complications.

## 2018-01-06 ENCOUNTER — Ambulatory Visit (INDEPENDENT_AMBULATORY_CARE_PROVIDER_SITE_OTHER): Payer: Medicare Other | Admitting: Vascular Surgery

## 2018-01-06 ENCOUNTER — Encounter (INDEPENDENT_AMBULATORY_CARE_PROVIDER_SITE_OTHER): Payer: Self-pay
